# Patient Record
Sex: Female | Born: 1980 | Race: White | Hispanic: No | Smoking: Never smoker
Health system: Southern US, Community
[De-identification: ages and names within clinical notes are randomized; demographics above are authoritative.]

## PROBLEM LIST (undated history)

## (undated) DIAGNOSIS — F410 Panic disorder [episodic paroxysmal anxiety] without agoraphobia: Secondary | ICD-10-CM

## (undated) DIAGNOSIS — I10 Essential (primary) hypertension: Secondary | ICD-10-CM

## (undated) HISTORY — DX: Panic disorder (episodic paroxysmal anxiety): F41.0

---
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
## 2001-08-12 ENCOUNTER — Emergency Department (HOSPITAL_COMMUNITY): Admission: EM | Admit: 2001-08-12 | Discharge: 2001-08-12 | Payer: Self-pay | Admitting: Emergency Medicine

## 2002-03-13 ENCOUNTER — Inpatient Hospital Stay (HOSPITAL_COMMUNITY): Admission: EM | Admit: 2002-03-13 | Discharge: 2002-03-16 | Payer: Self-pay | Admitting: Emergency Medicine

## 2002-07-13 ENCOUNTER — Ambulatory Visit (HOSPITAL_COMMUNITY): Admission: AD | Admit: 2002-07-13 | Discharge: 2002-07-13 | Payer: Self-pay | Admitting: Internal Medicine

## 2002-07-13 ENCOUNTER — Encounter: Payer: Self-pay | Admitting: Obstetrics & Gynecology

## 2002-08-27 ENCOUNTER — Ambulatory Visit (HOSPITAL_COMMUNITY): Admission: AD | Admit: 2002-08-27 | Discharge: 2002-08-27 | Payer: Self-pay | Admitting: Obstetrics and Gynecology

## 2002-09-09 ENCOUNTER — Inpatient Hospital Stay (HOSPITAL_COMMUNITY): Admission: AD | Admit: 2002-09-09 | Discharge: 2002-09-12 | Payer: Self-pay | Admitting: Obstetrics and Gynecology

## 2008-01-03 ENCOUNTER — Other Ambulatory Visit: Admission: RE | Admit: 2008-01-03 | Discharge: 2008-01-03 | Payer: Self-pay | Admitting: Obstetrics and Gynecology

## 2008-07-09 ENCOUNTER — Inpatient Hospital Stay (HOSPITAL_COMMUNITY): Admission: AD | Admit: 2008-07-09 | Discharge: 2008-07-11 | Payer: Self-pay | Admitting: Obstetrics and Gynecology

## 2008-07-09 ENCOUNTER — Ambulatory Visit: Payer: Self-pay | Admitting: Family Medicine

## 2009-06-02 ENCOUNTER — Other Ambulatory Visit: Admission: RE | Admit: 2009-06-02 | Discharge: 2009-06-02 | Payer: Self-pay | Admitting: Obstetrics & Gynecology

## 2010-04-27 LAB — CBC
MCHC: 33.9 g/dL (ref 30.0–36.0)
MCV: 84.4 fL (ref 78.0–100.0)
Platelets: 149 10*3/uL — ABNORMAL LOW (ref 150–400)
RBC: 4.26 MIL/uL (ref 3.87–5.11)

## 2010-04-27 LAB — RPR: RPR Ser Ql: NONREACTIVE

## 2010-06-05 NOTE — Op Note (Signed)
   [name], Pace                        ACCOUNT NO.:  [id number]   MEDICAL RECORD NO.:  [id number]                   PATIENT TYPE:  INP   LOCATION:  A418                                 FACILITY:  [facility]   PHYSICIAN:  Lazaro Arms, M.D.                DATE OF BIRTH:  May 15, 1980   DATE OF PROCEDURE:  09/10/2002  DATE OF DISCHARGE:  09/12/2002                                 OPERATIVE REPORT   Michelle Pace is a 30 year old white female admitted in the active phase of labor.  She is 4 cm, requesting an epidural to be placed.  As a result, she is  placed in the sitting position, a fluid bolus is given.  The iliac crests  are identified, the L2-3 interspace identified and marked.  The area is  prepped and field-draped.  Lidocaine 1% is used as a local anesthetic.  A 17-  gauge Tuohy needle is used.  With one pass using a loss of resistance  technique, the epidural space is found without difficulty.  Bupivacaine  0.125% plain 10 mL was given as a test dose without ill effects.  An  additional 10 mL was given to dose up the epidural.  It was taped in place 5  cm from the epidural space and a continuous infusion of 0.125% bupivacaine  with 2 mcg/mL of fentanyl was begun at 12 mL/hr.  The patient tolerated the  procedure well.  She had no ill effects and was getting good relief after  approximately 15 minutes.                                               Lazaro Arms, M.D.    Loraine Maple  D:  10/02/2002  T:  10/02/2002  Job:  [id number]

## 2010-06-05 NOTE — Consult Note (Signed)
NAME:  Michelle Pace, BAUTCH                        ACCOUNT NO.:  [id number]   MEDICAL RECORD NO.:  [id number]                   PATIENT TYPE:  INP   LOCATION:  A420                                 FACILITY:  [facility]   PHYSICIAN:  Kofi A. Gerilyn Pilgrim, M.D.              DATE OF BIRTH:  03/17/1980   DATE OF CONSULTATION:  03/14/2002  DATE OF DISCHARGE:                                   CONSULTATION   REASON FOR CONSULTATION:  Fever and headache and neck soreness.   IMPRESSION:  Possible meningitis with fever of unknown origin.  Increased  headache and neck soreness.  Her fever could be coming from pharyngitis,  given the examination and history of sore throat.   RECOMMENDATIONS:  Proceed with a spinal tap to assess for the possibility of  meningitis.  However, this may be a viral process.  Once again this needs to  be ruled out.   HISTORY OF PRESENT ILLNESS:  The patient is a 30 year old Caucasian lady who  is [redacted] weeks pregnant.  She presents with a three to four day history of  fever of 101 to 102, headache, and neck soreness.  She also did complain of  having some soreness of her throat previously before coming to the hospital,  though this has improved.  Workup so far has been essentially unremarkable.  Her urinalysis did show a few white blood cells and bacteria, but there were  many epithelial cells indicating the most likely not a good specimen.  The  urine cultures 24 hours has been negative.  Blood cultures are negative.  She has continued to have fevers and elevated white blood cell count.  Additionally, headaches and neck soreness have persisted, although they have  improved with 36 hours of antibiotics.   PAST MEDICAL HISTORY:  Unremarkable.   MEDICATIONS:  1. Prenatal vitamins.  2. Ancef IV.  3. Tylenol with Phenergan p.r.n.   REVIEW OF SYMPTOMS:  As stated, but otherwise unremarkable.   PHYSICAL EXAMINATION:  VITAL SIGNS:  Temperature is 102.8, blood pressure is  120/59,  pulse 115, respiratory rate 20.  NECK:  Shows mild stiffness.  GENERAL:  She is awake and alert.  Speech is intact.  NEUROLOGIC:  Cranial nerves show pupils are 5 mm and briskly reactive.  Visual fields are intact.  Extraocular movements are full.  Face muscle  strength is normal.  Tongue and uvula are in the midline.  HEENT:  She has erythematous pharynx with +4 tonsillar hypertrophy.  Funduscopic examination showed that the disks are flat.  She has spontaneous  venous pulsation.  EXTREMITIES:  Muscle examination shows normal tone, bulk, and strength.  There is no pronator drift.  Coordination is intact.  Reflexes are +3  throughout, toes are downgoing.  She does not have a __________.  Sensory  examination is intact to light touch.  Kofi A. Gerilyn Pilgrim, M.D.    KAD/MEDQ  D:  03/14/2002  T:  03/14/2002  Job:  [id number]

## 2010-06-05 NOTE — Discharge Summary (Signed)
   NAME:  Michelle Pace, Michelle Pace                        ACCOUNT NO.:  [id number]   MEDICAL RECORD NO.:  [id number]                   PATIENT TYPE:  INP   LOCATION:  A420                                 FACILITY:  [facility]   PHYSICIAN:  Tilda Burrow, M.D.              DATE OF BIRTH:  1980/03/30   DATE OF ADMISSION:  03/12/2002  DATE OF DISCHARGE:  03/16/2002                                 DISCHARGE SUMMARY   ADMITTING DIAGNOSES:  1. Headache, viral syndrome versus meningitis.  2. Pregnancy, [redacted] weeks gestation.   DISCHARGE DIAGNOSES:  1. Headache, viral syndrome versus meningitis.  2. Pregnancy, [redacted] weeks gestation.   PROCEDURE:  Spinal tap by Dr. Beryle Beams   HISTORY OF PRESENT ILLNESS:  This 30 year old female, gravida 1, para 0 due  09/12/2002 by initial evaluation the blood type A positive and routine  uncomplicated pregnancy, to date, was admitted after presenting to the  emergency room complaining of severe headache, neck soreness, and fever.  The patient had temperature up to 101-102.  She complained of some soreness  of the throat before going to the hospital which had improved prior to  admission.  Evaluation was to rule out severe acute deterioration.   HOSPITAL COURSE:  The patient had observation for observation for 2 days  with initial headache and temperature to 100.5.  Abdomen soft with uncertain  source for the first 24 hours with the patient given Rocephin as suppressive  therapy.  The following day the patient was alert, oriented, afebrile,  tolerating some diet, though not particularly hungry with neck soreness to  anterior flexion causing the patient to cry out in discomfort.  There was  absence of any other findings with white count elevated to 24,900, still;  and she was considered a neurology for meningitis was necessary.   Consult with neurology was obtained, and due to the headache and  questionable history a lumbar puncture was performed which revealed  clear  fluid.  She was, therefore, felt stable for discharge the following day.  The differential diagnosis was pharyngitis versus febrile syndrome of  unknown etiology.  The patient was stable for discharge on 03/16/2002 after  48 hours of antibiotics with only slight improvement in laboratory  evaluation.   DISCHARGE INSTRUCTIONS:  She will be followed up in our office in several  days.                                               Tilda Burrow, M.D.    JVF/MEDQ  D:  04/03/2002  T:  04/04/2002  Job:  [id number]

## 2010-06-05 NOTE — Op Note (Signed)
   NAME:  Michelle Pace, Michelle Pace                        ACCOUNT NO.:  [id number]   MEDICAL RECORD NO.:  [id number]                   PATIENT TYPE:  INP   LOCATION:  A420                                 FACILITY:  [facility]   PHYSICIAN:  Kofi A. Gerilyn Pilgrim, M.D.              DATE OF BIRTH:  Jul 25, 1980   DATE OF PROCEDURE:  DATE OF DISCHARGE:                                  PROCEDURE NOTE   PROCEDURE:  Spinal tap.   INDICATIONS FOR PROCEDURE:  Headache, fever, increased WBC and neck  stiffness.   ANALYSIS:  The patient was informed of the procedure, the need for the  procedure. The risks and benefits of the procedure were explained and  percentages given in regards to post-tap headache __________  were also  explained to the patient. The patient was prepped and draped in the usual  sterile fashion in the lying down position. After a single attempt, she was  placed in the sitting position and the L4-5 space was accessed after a  single pass.  Fluid is clear. This was sent for routine labs and routine  studies. She tolerated the procedure well.                                               Kofi A. Gerilyn Pilgrim, M.D.    KAD/MEDQ  D:  03/14/2002  T:  03/14/2002  Job:  [id number]

## 2010-06-05 NOTE — Op Note (Signed)
   NAMESUSSIE, MINOR                          ACCOUNT NO.:  [id number]   MEDICAL RECORD NO.:  [id number]                   PATIENT TYPE:   LOCATION:                                       FACILITY:   PHYSICIAN:  Tilda Burrow, M.D.              DATE OF BIRTH:   DATE OF PROCEDURE:  09/10/2002  DATE OF DISCHARGE:                                 OPERATIVE REPORT   DELIVERY SUMMARY   DATE OF DELIVERY:  September 10, 2002   ONSET OF LABOR:  September 10, 2002 at 0230   LENGTH OF FIRST STAGE LABOR:  10 hours   LENGTH OF SECOND STAGE LABOR:  3 hours 27 minutes   LENGTH OF THIRD STAGE LABOR:  8 minutes   DELIVERY NOTE:  Due to ineffective maternal pushing, pushing was delayed  until the mother had urge to push after epidural anesthesia was turned off  for approximately 1-1/2 hours.  Mother was bol used for delivery 10 cc of  epidural medication that was premixed in the pump and Kiwi vacuum placed  with the fetal head at +2, +3 station due to maternal fatigue and mother  assisted x3 uterine contractions for delivery of head.  Upon delivery of  head a nuchal cord was noted which was loosened and shoulders easily slipped  through without any difficulty.  The infant was dried and suctioned; cord  clamped and cut; and placed on the mother's abdomen in good condition.  Apgars were 8 and 9.   Third stage of labor was actually managed with 20 units of Pitocin in 1000  cc of lactated Ringers at a rapid rate.  Placenta was delivered  spontaneously via Tomasa Blase mechanism.  Membranes were noted to be intact upon  inspection.  A 3-vessel cord was noted also upon inspection.  There were  first degree left and right labial lacerations which were not bleeding so no  stitches were placed.  The patient was instructed on perineum care.   NOTE:  Output of urine for the past 3 hours is approximately 150 cc.  Urine  is very dark and concentrated and plan bolus 1000 cc of fluid for hydration  and monitor  the patient's I&O closely with Foley in place.  Epidural  catheter was removed with blue tip intact.  Infant and mother stabilized in  good condition and transferred out to the postpartum unit.     Zerita Boers, Reita Cliche, M.D.   DL/MEDQ  D:  16/10/9602  T:  09/10/2002  Job:  [id number]   cc:   [facility]
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
## 2010-06-05 NOTE — H&P (Signed)
NAME:  Michelle Pace, Michelle Pace                        ACCOUNT NO.:  [id number]   MEDICAL RECORD NO.:  [id number]                   PATIENT TYPE:  INP   LOCATION:  A420                                 FACILITY:  [facility]   PHYSICIAN:  Tilda Burrow, M.D.              DATE OF BIRTH:  Apr 08, 1980   DATE OF ADMISSION:  03/12/2002  DATE OF DISCHARGE:                                HISTORY & PHYSICAL   ADMISSION DIAGNOSES:  1. Headache.  2. Fever.  3. Intrauterine pregnancy at 16 weeks' gestation.   HISTORY OF PRESENT ILLNESS:  This 30 year old female, G1, P0 with LMP  November 25, 2001, with [facility] September 12, 2002, with ultrasound estimated [facility]  corresponding almost exactly, who was admitted after her pregnancy, followed  through our office with two prenatal visits to date.  The patient presented  complaining of fever.  She was seen in the emergency room and referred to  our practice.  ER physician's evaluation included temperature to 101.9,  pulse 128, blood pressure 103/68 with 98% O2 saturations.  She was admitted  for evaluation of fever etiology.  Evaluation in the emergency room did not  identified the precise etiology of the problem, but did identify normal CBC  except for white count markedly elevated at 27,300 with other laboratory  data notable for potassium of 2.9 and urinalysis showing 11-20 wbc's, few  bacteria and many epithelial cells on a voided specimen.  The patient was  admitted for evaluation of fever of uncertain etiology.  She did have  soreness in her throat prior to coming to the hospital, although that had  improved.  Workup was unremarkable with the patient admitted for antibiotic  therapy.   PAST MEDICAL HISTORY:  Unremarkable.   PAST SURGICAL HISTORY:  Negative.   SOCIAL HISTORY:  She is single and lives with the baby's father.  Self-  described slow learner.   ALLERGIES:  No known drug allergies.   PHYSICAL EXAMINATION:  VITAL SIGNS:  Height 5 feet 6 inches,  weight 121.  Temperature 102.8, blood pressure 128/59, pulse 115, respirations 20.  GENERAL:  Moderately uncomfortable female, alert and oriented x3.  HEENT:  Pupils equal round and reactive.  NECK:  Slight soreness with forward manipulation.  CHEST:  Clear to auscultation.  BACK:  No CVA tenderness.  NEUROLOGIC:  Mental status shows the patient alert, cooperative and  appropriate.   IMPRESSION:  1. Fever of uncertain etiology.  2. Hypokalemia.  3.     Intrauterine pregnancy.  4. Marked leukocytosis.   PLAN:  IV antibiotic therapy.                                               Tilda Burrow, M.D.    JVF/MEDQ  D:  04/08/2002  T:  04/09/2002  Job:  [id number]

## 2011-11-19 HISTORY — PX: WISDOM TOOTH EXTRACTION: SHX21

## 2012-10-31 ENCOUNTER — Other Ambulatory Visit: Payer: Self-pay | Admitting: Women's Health

## 2012-11-14 ENCOUNTER — Other Ambulatory Visit (HOSPITAL_COMMUNITY)
Admission: RE | Admit: 2012-11-14 | Discharge: 2012-11-14 | Disposition: A | Discharge status: Home / Self Care | Payer: Medicaid Other | Source: Ambulatory Visit | Attending: Obstetrics & Gynecology | Admitting: Obstetrics & Gynecology

## 2012-11-14 ENCOUNTER — Encounter: Payer: Self-pay | Admitting: Women's Health

## 2012-11-14 ENCOUNTER — Ambulatory Visit (INDEPENDENT_AMBULATORY_CARE_PROVIDER_SITE_OTHER): Payer: Medicaid Other | Admitting: Women's Health

## 2012-11-14 VITALS — BP 120/80 | Ht 60.0 in | Wt 156.0 lb

## 2012-11-14 DIAGNOSIS — Z0000 Encounter for general adult medical examination without abnormal findings: Secondary | ICD-10-CM

## 2012-11-14 DIAGNOSIS — N390 Urinary tract infection, site not specified: Secondary | ICD-10-CM

## 2012-11-14 DIAGNOSIS — Z01419 Encounter for gynecological examination (general) (routine) without abnormal findings: Secondary | ICD-10-CM

## 2012-11-14 DIAGNOSIS — Z1151 Encounter for screening for human papillomavirus (HPV): Secondary | ICD-10-CM | POA: Insufficient documentation

## 2012-11-14 DIAGNOSIS — Z113 Encounter for screening for infections with a predominantly sexual mode of transmission: Secondary | ICD-10-CM | POA: Insufficient documentation

## 2012-11-14 LAB — POCT URINALYSIS DIPSTICK
Leukocytes, UA: NEGATIVE
Nitrite, UA: NEGATIVE
Protein, UA: NEGATIVE

## 2012-11-14 NOTE — Progress Notes (Signed)
Patient ID: Michelle Pace, female   DOB: 24-Aug-1980, 32 y.o.   MRN: [id number] Subjective:     Michelle Pace is a 32 y.o. Caucasian  female here for a routine well-woman exam.  Patient's last menstrual period was 11/08/2012. Regular periods.  Current complaints: Rt knee pain x 2 days, no recent trauma. Occ HAs.  Smoking Status: non-smoker Does desire labs, but not fasting today. Does not desire STI screening.   Personal health questionnaire reviewed: yes.   Gynecologic History Patient's last menstrual period was 11/08/2012. Contraception: OCP (estrogen/progesterone) Last Pap: 2013 at [place]. Results were: normal per pt Last mammogram: never. Results were: n/a  Obstetric History Reports having 2 sons, both SVD  The following portions of the patient's history were reviewed and updated as appropriate: allergies, current medications, past family history, past medical history, past social history, past surgical history and problem list.  Review of Systems  Review of Systems  Constitutional: Negative for fever, chills, weight loss, malaise/fatigue and diaphoresis.  HENT: Negative for hearing loss, ear pain, nosebleeds, congestion, sore throat, neck       pain, tinnitus and ear discharge.   Eyes: Negative for blurred vision, double vision, photophobia, pain, discharge and       redness.  Respiratory: Negative for cough, hemoptysis, sputum production, shortness of breath,       wheezing and stridor.   Cardiovascular: Negative for chest pain, palpitations, orthopnea, claudication, leg       swelling and PND.  Gastrointestinal: Negative for heartburn, nausea, vomiting, diarrhea, constipation, blood in stool and melena. Pos for     occ lower pelvic pain.  Genitourinary: Negative for dysuria, urgency, frequency, hematuria, flank pain,       incontinence, and dyspareunia.  Musculoskeletal: Negative for myalgias, back pain, and falls. Does have Rt knee pain Skin: Negative for itching  and rash.  Neurological: Negative for dizziness, tingling, tremors, sensory change, speech change,     focal weakness, seizures, loss of consciousness, weakness and headaches.  Endo/Heme/Allergies: Negative for environmental allergies and polydipsia. Does not       bruise/bleed easily.  Psychiatric/Behavioral: Negative for depression, suicidal ideas, hallucinations, memory       loss and substance abuse. The patient is not nervous/anxious and does not have       insomnia.        Objective:     Physical Exam  BP 120/80  Ht 5' (1.524 m)  Wt 156 lb (70.761 kg)  BMI 30.47 kg/m2  LMP 11/08/2012 Constitutional: She is oriented to person, place, and time. She appears well-developed    and well-nourished.  HEENT:     Head: Normocephalic and atraumatic.           Right Ear: External ear normal.     Left Ear: External ear normal.     Nose: Nose normal.     Mouth/Throat: Oropharynx is clear and moist.     Eyes: Conjunctivae and EOM are normal. Pupils are equal, round, and reactive to light.    Right eye exhibits no discharge. Left eye exhibits no discharge. No scleral icterus.  Neck: Normal range of motion. Neck supple. No tracheal deviation present. No          thyromegaly present.  Cardiovascular: Normal rate, regular rhythm, normal heart sounds and intact distal          pulses.  Exam reveals no gallop and no friction rub.  No murmur heard. Respiratory: Effort normal and breath sounds normal.  No respiratory distress. She has       no wheezes. She has no rales. She exhibits no tenderness.  Breasts: no dominate palpable mass, retraction or nipple discharge  GI: Soft. Bowel sounds are normal. She exhibits no distension and no mass. There is no    tenderness. There is no rebound and no guarding.     Hemoccult: n/a Genitourinary:     Vulva is normal without lesions    Vagina is pink moist without discharge    Cervix normal in appearance and thin prep pap is done w/ HR HPV cotesting     Uterus is normal size shape and contour    Adnexa is negative with normal sized ovaries   Musculoskeletal: Normal range of motion. She exhibits no edema and no tenderness. No obvious edema, erythema,       tenderness, bruising or abnormality to Rt knee.  Neurological: She is alert and oriented to person, place, and time. She has normal    reflexes. She displays normal reflexes. No cranial nerve deficit. She exhibits normal    muscle tone. Coordination normal.  Skin: Skin is warm and dry. No rash noted. No erythema. No pallor.  Psychiatric: She has a normal mood and affect. Her behavior is normal. Judgment and    thought content normal.       Assessment:     Healthy well-woman exam Rt knee pain Occ HAs     Plan:   F/U 1 year for physical F/U w/ PCP if knee pain continues Continue COCs To return this week for fasting labs: CBC, CMP, TSH, Lipid panel Mammogram @ 31yo or sooner if problems Colonoscopy @ 31yo or sooner if problems   Marge Duncans 11/14/2012 1:06 PM

## 2012-11-14 NOTE — Patient Instructions (Addendum)
*  Nothing to eat or drink after midnight the night before you come for your labs*  Migraine Headache A migraine headache is an intense, throbbing pain on one or both sides of your head. A migraine can last for 30 minutes to several hours. CAUSES  The exact cause of a migraine headache is not always known. However, a migraine may be caused when nerves in the brain become irritated and release chemicals that cause inflammation. This causes pain. SYMPTOMS  Pain on one or both sides of your head.  Pulsating or throbbing pain.  Severe pain that prevents daily activities.  Pain that is aggravated by any physical activity.  Nausea, vomiting, or both.  Dizziness.  Pain with exposure to bright lights, loud noises, or activity.  General sensitivity to bright lights, loud noises, or smells. Before you get a migraine, you may get warning signs that a migraine is coming (aura). An aura may include:  Seeing flashing lights.  Seeing bright spots, halos, or zig-zag lines.  Having tunnel vision or blurred vision.  Having feelings of numbness or tingling.  Having trouble talking.  Having muscle weakness. MIGRAINE TRIGGERS  Alcohol.  Smoking.  Stress.  Menstruation.  Aged cheeses.  Foods or drinks that contain nitrates, glutamate, aspartame, or tyramine.  Lack of sleep.  Chocolate.  Caffeine.  Hunger.  Physical exertion.  Fatigue.  Medicines used to treat chest pain (nitroglycerine), birth control pills, estrogen, and some blood pressure medicines. DIAGNOSIS  A migraine headache is often diagnosed based on:  Symptoms.  Physical examination.  A CT scan or MRI of your head. TREATMENT Medicines may be given for pain and nausea. Medicines can also be given to help prevent recurrent migraines.  HOME CARE INSTRUCTIONS  Only take over-the-counter or prescription medicines for pain or discomfort as directed by your caregiver. The use of long-term narcotics is not  recommended.  Lie down in a dark, quiet room when you have a migraine.  Keep a journal to find out what may trigger your migraine headaches. For example, write down:  What you eat and drink.  How much sleep you get.  Any change to your diet or medicines.  Limit alcohol consumption.  Quit smoking if you smoke.  Get 7 to 9 hours of sleep, or as recommended by your caregiver.  Limit stress.  Keep lights dim if bright lights bother you and make your migraines worse. SEEK IMMEDIATE MEDICAL CARE IF:   Your migraine becomes severe.  You have a fever.  You have a stiff neck.  You have vision loss.  You have muscular weakness or loss of muscle control.  You start losing your balance or have trouble walking.  You feel faint or pass out.  You have severe symptoms that are different from your first symptoms. MAKE SURE YOU:   Understand these instructions.  Will watch your condition.  Will get help right away if you are not doing well or get worse. Document Released: 01/04/2005 Document Revised: 03/29/2011 Document Reviewed: 12/25/2010 [facility] Patient Information 2014 [place], [place].

## 2012-11-21 ENCOUNTER — Other Ambulatory Visit: Payer: Medicaid Other

## 2012-11-21 DIAGNOSIS — Z0000 Encounter for general adult medical examination without abnormal findings: Secondary | ICD-10-CM

## 2012-11-21 DIAGNOSIS — Z1329 Encounter for screening for other suspected endocrine disorder: Secondary | ICD-10-CM

## 2012-11-21 DIAGNOSIS — Z13220 Encounter for screening for lipoid disorders: Secondary | ICD-10-CM

## 2012-11-21 LAB — COMPREHENSIVE METABOLIC PANEL
AST: 9 U/L (ref 0–37)
Alkaline Phosphatase: 77 U/L (ref 39–117)
BUN: 9 mg/dL (ref 6–23)
CO2: 25 mEq/L (ref 19–32)
Calcium: 9 mg/dL (ref 8.4–10.5)
Creat: 0.73 mg/dL (ref 0.50–1.10)
Potassium: 3.9 mEq/L (ref 3.5–5.3)
Sodium: 138 mEq/L (ref 135–145)

## 2012-11-21 LAB — CBC
MCH: 28.5 pg (ref 26.0–34.0)
MCHC: 33.6 g/dL (ref 30.0–36.0)
MCV: 84.9 fL (ref 78.0–100.0)
Platelets: 284 10*3/uL (ref 150–400)
RBC: 4.49 MIL/uL (ref 3.87–5.11)
RDW: 13.8 % (ref 11.5–15.5)
WBC: 9.9 10*3/uL (ref 4.0–10.5)

## 2012-11-21 LAB — LIPID PANEL
HDL: 52 mg/dL (ref >39)
LDL Cholesterol: 79 mg/dL (ref 0–99)
Total CHOL/HDL Ratio: 3 Ratio

## 2012-11-21 LAB — TSH: TSH: 0.993 u[IU]/mL (ref 0.350–4.500)

## 2012-11-23 ENCOUNTER — Telehealth: Payer: Self-pay | Admitting: *Deleted

## 2012-11-23 NOTE — Telephone Encounter (Signed)
Pt informed results normal from 11/21/2012.

## 2013-01-05 ENCOUNTER — Other Ambulatory Visit: Payer: Self-pay | Admitting: Adult Health

## 2013-01-09 ENCOUNTER — Other Ambulatory Visit: Payer: Self-pay | Admitting: Obstetrics & Gynecology

## 2013-08-20 ENCOUNTER — Telehealth: Payer: Self-pay | Admitting: Adult Health

## 2013-08-20 NOTE — Telephone Encounter (Signed)
Pt states she gets short of breath and wheezes every once in a while and wants to know if it could be due to her BCP, she states she was on the injection and then switched to an oral BC and doesn't remember ever having this occur while on the shot, she cant really say when it started and it is only every now and then that she gets it.  She states she has an appointment with her PCP this week, I advised her to keep her appt with him and discuss her symptoms with him and if she still thinks it could be due to the BCP call us back.  Pt verbalized understanding.

## 2013-08-23 ENCOUNTER — Other Ambulatory Visit (HOSPITAL_COMMUNITY): Payer: Self-pay | Admitting: Physician Assistant

## 2013-08-23 ENCOUNTER — Ambulatory Visit (HOSPITAL_COMMUNITY)
Admission: RE | Admit: 2013-08-23 | Discharge: 2013-08-23 | Disposition: A | Discharge status: Home / Self Care | Payer: Medicaid Other | Source: Ambulatory Visit | Attending: Physician Assistant | Admitting: Physician Assistant

## 2013-08-23 DIAGNOSIS — R062 Wheezing: Secondary | ICD-10-CM | POA: Diagnosis not present

## 2013-08-23 DIAGNOSIS — R5381 Other malaise: Secondary | ICD-10-CM | POA: Diagnosis not present

## 2013-08-23 DIAGNOSIS — R05 Cough: Secondary | ICD-10-CM | POA: Insufficient documentation

## 2013-08-23 DIAGNOSIS — R5383 Other fatigue: Secondary | ICD-10-CM | POA: Diagnosis not present

## 2013-08-23 DIAGNOSIS — R0602 Shortness of breath: Secondary | ICD-10-CM | POA: Insufficient documentation

## 2013-08-23 DIAGNOSIS — R059 Cough, unspecified: Secondary | ICD-10-CM | POA: Diagnosis not present

## 2013-09-11 ENCOUNTER — Other Ambulatory Visit: Payer: Self-pay | Admitting: Obstetrics & Gynecology

## 2013-12-25 ENCOUNTER — Encounter (HOSPITAL_COMMUNITY): Payer: Self-pay | Admitting: *Deleted

## 2013-12-25 ENCOUNTER — Emergency Department (HOSPITAL_COMMUNITY)
Admission: EM | Admit: 2013-12-25 | Discharge: 2013-12-26 | Disposition: A | Discharge status: Home / Self Care | Payer: Medicaid Other | Attending: Emergency Medicine | Admitting: Emergency Medicine

## 2013-12-25 DIAGNOSIS — R112 Nausea with vomiting, unspecified: Secondary | ICD-10-CM | POA: Diagnosis present

## 2013-12-25 DIAGNOSIS — R197 Diarrhea, unspecified: Secondary | ICD-10-CM | POA: Diagnosis not present

## 2013-12-25 DIAGNOSIS — Z3202 Encounter for pregnancy test, result negative: Secondary | ICD-10-CM | POA: Diagnosis not present

## 2013-12-25 DIAGNOSIS — Z8659 Personal history of other mental and behavioral disorders: Secondary | ICD-10-CM | POA: Insufficient documentation

## 2013-12-25 DIAGNOSIS — Z79899 Other long term (current) drug therapy: Secondary | ICD-10-CM | POA: Diagnosis not present

## 2013-12-25 DIAGNOSIS — R109 Unspecified abdominal pain: Secondary | ICD-10-CM | POA: Diagnosis not present

## 2013-12-25 LAB — URINE MICROSCOPIC-ADD ON

## 2013-12-25 LAB — URINALYSIS, ROUTINE W REFLEX MICROSCOPIC
GLUCOSE, UA: NEGATIVE mg/dL
Hgb urine dipstick: NEGATIVE
KETONES UR: 40 mg/dL — AB
Leukocytes, UA: NEGATIVE
Nitrite: NEGATIVE
PH: 5.5 (ref 5.0–8.0)
Specific Gravity, Urine: >1.03 — ABNORMAL HIGH (ref 1.005–1.030)
Urobilinogen, UA: 0.2 mg/dL (ref 0.0–1.0)

## 2013-12-25 LAB — PREGNANCY, URINE: Preg Test, Ur: NEGATIVE

## 2013-12-25 MED ORDER — ONDANSETRON 4 MG PO TBDP: 4.0000 mg | ORAL_TABLET | Freq: Three times a day (TID) | ORAL | Status: DC | PRN

## 2013-12-25 MED ORDER — SODIUM CHLORIDE 0.9 % IV SOLN
1000.0000 mL | Freq: Once | INTRAVENOUS | Status: AC
  Administered 2013-12-25: 1000 mL via INTRAVENOUS

## 2013-12-25 MED ORDER — ONDANSETRON 4 MG PO TBDP
ORAL_TABLET | ORAL | Status: AC
  Administered 2013-12-25: 4 mg
  Filled 2013-12-25: qty 1

## 2013-12-25 MED ORDER — ONDANSETRON HCL 4 MG/2ML IJ SOLN
4.0000 mg | Freq: Once | INTRAMUSCULAR | Status: DC
  Filled 2013-12-25: qty 2

## 2013-12-25 MED ORDER — SODIUM CHLORIDE 0.9 % IV BOLUS (SEPSIS): 1000.0000 mL | Freq: Once | INTRAVENOUS | Status: DC

## 2013-12-25 MED ORDER — ONDANSETRON 4 MG PO TBDP: 4.0000 mg | ORAL_TABLET | Freq: Once | ORAL | Status: AC

## 2013-12-25 NOTE — ED Provider Notes (Addendum)
12:00 AM  Assumed care from Dr. Hyacinth MeekerMiller.  Pt is a 33 y.o. F who presents to the emergency department with vomiting and diarrhea. Abdominal exam benign. Plan was to follow up on urinalysis, urine pregnancy test. Reassess symptoms. Patient reports feeling much better after IV medications.  Urine shows many bacteria but also many squamous cells and no other sign of infection. She does have elevated specific gravity and ketones in her urine but is now tolerating by mouth without vomiting or diarrhea. Repeat abdominal exam benign. Urine pregnancy negative. She states her son has had similar symptoms. Suspect viral illness. Will discharge home with prescription for Zofran. Have advised her to continue drinking clear fluids. Discussed return precautions. She verbalized understanding and is comfortable with plan.  Layla MawKristen N Ward, DO 12/25/13 2350  Layla MawKristen N Ward, DO 12/25/13 2351

## 2013-12-25 NOTE — Discharge Instructions (Signed)

## 2013-12-25 NOTE — ED Notes (Signed)
Pt c/o vomiting on and off all day.

## 2013-12-25 NOTE — ED Provider Notes (Signed)
CSN: [id number]     Arrival date & time 12/25/13  2108 History  This chart was scribed for Michelle RollerBrian D Mychal Decarlo, MD by Gwenyth Oberatherine Macek, ED Scribe. This patient was seen in room APA05/APA05 and the patient's care was started at 9:44 PM.    No chief complaint on file.   The history is provided by the patient. No language interpreter was used.    HPI Comments: Baxter HireJessica L Pace is a 33 y.o. female who presents to the Emergency Department complaining of intermittent watery diarrhea and vomiting that started 1 day ago. She states lower abdominal pain as an associated symptom. Pt notes positive sick contact at home and that one of her children is vomiting after contracting a virus. She has tried Advil and Weyerhaeuser CompanyPepto Bismol with no relief. Pt denies blood in her stool, headache, sore throat and dysuria as associated symptoms.   Past Medical History  Diagnosis Date  . Panic attacks    Past Surgical History  Procedure Laterality Date  . Wisdom tooth extraction  11/2011   Family History  Problem Relation Age of Onset  . Other Mother     brain tumor  . Cancer Father    History  Substance Use Topics  . Smoking status: Never Smoker   . Smokeless tobacco: Never Used  . Alcohol Use: No   OB History    No data available     Review of Systems  Gastrointestinal: Positive for nausea, vomiting and diarrhea.  All other systems reviewed and are negative.   Allergies  Review of patient's allergies indicates no known allergies.  Home Medications   Prior to Admission medications   Medication Sig Start Date End Date Taking? Authorizing Provider  escitalopram (LEXAPRO) 20 MG tablet take 1 tablet by mouth once daily 09/11/13   Lazaro ArmsLuther H Eure, MD  ibuprofen (ADVIL,MOTRIN) 200 MG tablet Take 200 mg by mouth as needed for pain.    Historical Provider, MD  TRI-LINYAH 0.18/0.215/0.25 MG-35 MCG tablet take 1 tablet by mouth once daily 01/05/13   Adline PotterJennifer A Griffin, NP   BP 107/74 mmHg  Pulse 123  Temp(Src) 98.1  F (36.7 C) (Oral)  Resp 20  Ht 5\' 1"  (1.549 m)  Wt 150 lb (68.04 kg)  BMI 28.36 kg/m2  SpO2 100%  LMP 12/16/2013 Physical Exam  Constitutional: She appears well-developed and well-nourished. No distress.  HENT:  Head: Normocephalic and atraumatic.  Mouth/Throat: Oropharynx is clear and moist. No oropharyngeal exudate.  Eyes: Conjunctivae and EOM are normal. Pupils are equal, round, and reactive to light. Right eye exhibits no discharge. Left eye exhibits no discharge. No scleral icterus.  Neck: Normal range of motion. Neck supple. No JVD present. No thyromegaly present.  Cardiovascular: Normal rate, regular rhythm, normal heart sounds and intact distal pulses.  Exam reveals no gallop and no friction rub.   No murmur heard. Pulmonary/Chest: Effort normal and breath sounds normal. No respiratory distress. She has no wheezes. She has no rales.  Abdominal: Soft. Bowel sounds are normal. She exhibits no distension and no mass. There is no tenderness. There is no guarding.  Lower abdominal tenderness  Musculoskeletal: Normal range of motion. She exhibits no edema or tenderness.  Lymphadenopathy:    She has no cervical adenopathy.  Neurological: She is alert. Coordination normal.  Skin: Skin is warm and dry. No rash noted. No erythema.  Psychiatric: She has a normal mood and affect. Her behavior is normal.  Nursing note and vitals reviewed.   ED  Course  Procedures (including critical care time) DIAGNOSTIC STUDIES: Oxygen Saturation is 100% on RA, normal by my interpretation.    COORDINATION OF CARE: 9:46 PM Discussed treatment plan with pt which includes UA and lab work. Pt agreed to plan.  Labs Review Labs Reviewed  URINALYSIS, ROUTINE W REFLEX MICROSCOPIC  PREGNANCY, URINE  CBC WITH DIFFERENTIAL  COMPREHENSIVE METABOLIC PANEL  LIPASE, BLOOD    Imaging Review No results found.    MDM   Final diagnoses:  None    Pt with high SG, fluids, zofran given, pt agreeable to  f/u - well appaering otherwise, tachycardia improved  Meds given in ED:  Medications  ondansetron (ZOFRAN-ODT) 4 MG disintegrating tablet (4 mg  Given 12/25/13 2205)  ondansetron (ZOFRAN-ODT) disintegrating tablet 4 mg (0 mg Oral Duplicate 12/25/13 2206)  0.9 %  sodium chloride infusion (0 mLs Intravenous Stopped 12/25/13 2351)    Discharge Medication List as of 12/25/2013 11:51 PM    START taking these medications   Details  ondansetron (ZOFRAN ODT) 4 MG disintegrating tablet Take 1 tablet (4 mg total) by mouth every 8 (eight) hours as needed for nausea or vomiting., Starting 12/25/2013, Until Discontinued, Print          I personally performed the services described in this documentation, which was scribed in my presence. The recorded information has been reviewed and is accurate.       Michelle RollerBrian D Waverley Krempasky, MD 01/01/14 [phone]

## 2014-02-02 ENCOUNTER — Other Ambulatory Visit: Payer: Self-pay | Admitting: Adult Health

## 2014-02-07 ENCOUNTER — Encounter: Payer: Self-pay | Admitting: Obstetrics and Gynecology

## 2014-02-07 ENCOUNTER — Other Ambulatory Visit: Payer: Medicaid Other | Admitting: Obstetrics and Gynecology

## 2014-02-15 ENCOUNTER — Ambulatory Visit (INDEPENDENT_AMBULATORY_CARE_PROVIDER_SITE_OTHER): Payer: Medicaid Other | Admitting: Obstetrics & Gynecology

## 2014-02-15 ENCOUNTER — Other Ambulatory Visit (HOSPITAL_COMMUNITY)
Admission: RE | Admit: 2014-02-15 | Discharge: 2014-02-15 | Disposition: A | Discharge status: Home / Self Care | Payer: Medicaid Other | Source: Ambulatory Visit | Attending: Obstetrics and Gynecology | Admitting: Obstetrics and Gynecology

## 2014-02-15 ENCOUNTER — Encounter: Payer: Self-pay | Admitting: Obstetrics & Gynecology

## 2014-02-15 VITALS — BP 130/90 | Ht 59.0 in | Wt 166.0 lb

## 2014-02-15 DIAGNOSIS — Z01419 Encounter for gynecological examination (general) (routine) without abnormal findings: Secondary | ICD-10-CM | POA: Diagnosis not present

## 2014-02-15 DIAGNOSIS — Z0000 Encounter for general adult medical examination without abnormal findings: Secondary | ICD-10-CM

## 2014-02-15 DIAGNOSIS — Z3202 Encounter for pregnancy test, result negative: Secondary | ICD-10-CM

## 2014-02-15 LAB — POCT URINE PREGNANCY: Preg Test, Ur: NEGATIVE

## 2014-02-15 MED ORDER — NORGESTIM-ETH ESTRAD TRIPHASIC 0.18/0.215/0.25 MG-35 MCG PO TABS: 1.0000 | ORAL_TABLET | Freq: Every day | ORAL | Status: DC

## 2014-02-15 NOTE — Addendum Note (Signed)
Addended by: Criss AlvinePULLIAM, Julianah Marciel G on: 02/15/2014 10:59 AM   Modules accepted: Orders

## 2014-02-15 NOTE — Progress Notes (Signed)
Patient ID: Michelle Pace, female   DOB: 07/26/1980, 34 y.o.   MRN: [id number] Subjective:     Michelle Pace is a 34 y.o. female here for a routine exam.  Patient's last menstrual period was 02/12/2014. No obstetric history on file. Birth Control Method:  OCP Menstrual Calendar(currently): regular  Current complaints: none.   Current acute medical issues:  none   Recent Gynecologic History Patient's last menstrual period was 02/12/2014. Last Pap: 2014,  normal Last mammogram: na,    Past Medical History  Diagnosis Date  . Panic attacks     Past Surgical History  Procedure Laterality Date  . Wisdom tooth extraction  11/2011    OB History    No data available      History   Social History  . Marital Status: Single    Spouse Name: N/A    Number of Children: N/A  . Years of Education: N/A   Social History Main Topics  . Smoking status: Never Smoker   . Smokeless tobacco: Never Used  . Alcohol Use: No  . Drug Use: No  . Sexual Activity: Yes    Birth Control/ Protection: Pill   Other Topics Concern  . None   Social History Narrative    Family History  Problem Relation Age of Onset  . Other Mother     brain tumor  . Cancer Father      Current outpatient prescriptions:  .  escitalopram (LEXAPRO) 20 MG tablet, take 1 tablet by mouth once daily, Disp: 30 tablet, Rfl: 11 .  ibuprofen (ADVIL,MOTRIN) 200 MG tablet, Take 200 mg by mouth as needed for pain., Disp: , Rfl:  .  ondansetron (ZOFRAN ODT) 4 MG disintegrating tablet, Take 1 tablet (4 mg total) by mouth every 8 (eight) hours as needed for nausea or vomiting. (Patient not taking: Reported on 02/15/2014), Disp: 20 tablet, Rfl: 0 .  TRI-SPRINTEC 0.18/0.215/0.25 MG-35 MCG tablet, take 1 tablet by mouth once daily (Patient not taking: Reported on 02/15/2014), Disp: 28 tablet, Rfl: PRN  Review of Systems  Review of Systems  Constitutional: Negative for fever, chills, weight loss, malaise/fatigue and  diaphoresis.  HENT: Negative for hearing loss, ear pain, nosebleeds, congestion, sore throat, neck pain, tinnitus and ear discharge.   Eyes: Negative for blurred vision, double vision, photophobia, pain, discharge and redness.  Respiratory: Negative for cough, hemoptysis, sputum production, shortness of breath, wheezing and stridor.   Cardiovascular: Negative for chest pain, palpitations, orthopnea, claudication, leg swelling and PND.  Gastrointestinal: negative for abdominal pain. Negative for heartburn, nausea, vomiting, diarrhea, constipation, blood in stool and melena.  Genitourinary: Negative for dysuria, urgency, frequency, hematuria and flank pain.  Musculoskeletal: Negative for myalgias, back pain, joint pain and falls.  Skin: Negative for itching and rash.  Neurological: Negative for dizziness, tingling, tremors, sensory change, speech change, focal weakness, seizures, loss of consciousness, weakness and headaches.  Endo/Heme/Allergies: Negative for environmental allergies and polydipsia. Does not bruise/bleed easily.  Psychiatric/Behavioral: Negative for depression, suicidal ideas, hallucinations, memory loss and substance abuse. The patient is not nervous/anxious and does not have insomnia.        Objective:  Blood pressure 130/90, height 4\' 11"  (1.499 m), weight 166 lb (75.297 kg), last menstrual period 02/12/2014.   Physical Exam  Vitals reviewed. Constitutional: She is oriented to person, place, and time. She appears well-developed and well-nourished.  HENT:  Head: Normocephalic and atraumatic.        Right Ear: External ear normal.  Left Ear: External ear normal.  Nose: Nose normal.  Mouth/Throat: Oropharynx is clear and moist.  Eyes: Conjunctivae and EOM are normal. Pupils are equal, round, and reactive to light. Right eye exhibits no discharge. Left eye exhibits no discharge. No scleral icterus.  Neck: Normal range of motion. Neck supple. No tracheal deviation present. No  thyromegaly present.  Cardiovascular: Normal rate, regular rhythm, normal heart sounds and intact distal pulses.  Exam reveals no gallop and no friction rub.   No murmur heard. Respiratory: Effort normal and breath sounds normal. No respiratory distress. She has no wheezes. She has no rales. She exhibits no tenderness.  GI: Soft. Bowel sounds are normal. She exhibits no distension and no mass. There is no tenderness. There is no rebound and no guarding.  Genitourinary:  Breasts no masses skin changes or nipple changes bilaterally      Vulva is normal without lesions Vagina is pink moist without discharge Cervix normal in appearance and pap is done Uterus is normal size shape and contour Adnexa is negative with normal sized ovaries   Musculoskeletal: Normal range of motion. She exhibits no edema and no tenderness.  Neurological: She is alert and oriented to person, place, and time. She has normal reflexes. She displays normal reflexes. No cranial nerve deficit. She exhibits normal muscle tone. Coordination normal.  Skin: Skin is warm and dry. No rash noted. No erythema. No pallor.  Psychiatric: She has a normal mood and affect. Her behavior is normal. Judgment and thought content normal.       Assessment:    Healthy female exam.    Plan:    Contraception: OCP (estrogen/progesterone).    Follow up prn or 1 year

## 2014-02-18 LAB — CYTOLOGY - PAP

## 2014-09-12 ENCOUNTER — Other Ambulatory Visit: Payer: Self-pay | Admitting: Obstetrics & Gynecology

## 2014-09-13 ENCOUNTER — Other Ambulatory Visit: Payer: Self-pay | Admitting: Obstetrics & Gynecology

## 2015-02-15 ENCOUNTER — Other Ambulatory Visit: Payer: Self-pay | Admitting: Adult Health

## 2015-02-20 ENCOUNTER — Other Ambulatory Visit: Payer: Medicaid Other | Admitting: Obstetrics & Gynecology

## 2015-02-28 ENCOUNTER — Other Ambulatory Visit: Payer: Medicaid Other | Admitting: Obstetrics & Gynecology

## 2015-02-28 ENCOUNTER — Encounter: Payer: Self-pay | Admitting: Obstetrics & Gynecology

## 2015-04-19 ENCOUNTER — Encounter (HOSPITAL_COMMUNITY): Payer: Self-pay | Admitting: *Deleted

## 2015-04-19 ENCOUNTER — Emergency Department (HOSPITAL_COMMUNITY)
Admission: EM | Admit: 2015-04-19 | Discharge: 2015-04-19 | Disposition: A | Discharge status: Home / Self Care | Payer: Medicaid Other | Attending: Emergency Medicine | Admitting: Emergency Medicine

## 2015-04-19 ENCOUNTER — Emergency Department (HOSPITAL_COMMUNITY): Payer: Medicaid Other

## 2015-04-19 DIAGNOSIS — F419 Anxiety disorder, unspecified: Secondary | ICD-10-CM | POA: Diagnosis present

## 2015-04-19 DIAGNOSIS — Z79899 Other long term (current) drug therapy: Secondary | ICD-10-CM | POA: Diagnosis not present

## 2015-04-19 DIAGNOSIS — E860 Dehydration: Secondary | ICD-10-CM | POA: Insufficient documentation

## 2015-04-19 DIAGNOSIS — J020 Streptococcal pharyngitis: Secondary | ICD-10-CM | POA: Insufficient documentation

## 2015-04-19 LAB — URINE MICROSCOPIC-ADD ON: RBC / HPF: NONE SEEN RBC/hpf (ref 0–5)

## 2015-04-19 LAB — CBC
HEMATOCRIT: 38.5 % (ref 36.0–46.0)
Hemoglobin: 13.2 g/dL (ref 12.0–15.0)
MCH: 29.5 pg (ref 26.0–34.0)
MCHC: 34.3 g/dL (ref 30.0–36.0)
MCV: 85.9 fL (ref 78.0–100.0)
PLATELETS: 254 10*3/uL (ref 150–400)
RBC: 4.48 MIL/uL (ref 3.87–5.11)
RDW: 13.5 % (ref 11.5–15.5)
WBC: 26 10*3/uL — ABNORMAL HIGH (ref 4.0–10.5)

## 2015-04-19 LAB — URINALYSIS, ROUTINE W REFLEX MICROSCOPIC
GLUCOSE, UA: NEGATIVE mg/dL
Hgb urine dipstick: NEGATIVE
Nitrite: NEGATIVE
PH: 6 (ref 5.0–8.0)
Protein, ur: NEGATIVE mg/dL
Specific Gravity, Urine: 1.02 (ref 1.005–1.030)

## 2015-04-19 LAB — RAPID STREP SCREEN (MED CTR MEBANE ONLY): Streptococcus, Group A Screen (Direct): POSITIVE — AB

## 2015-04-19 LAB — COMPREHENSIVE METABOLIC PANEL
ALT: 12 U/L — AB (ref 14–54)
AST: 21 U/L (ref 15–41)
Albumin: 3.9 g/dL (ref 3.5–5.0)
Alkaline Phosphatase: 77 U/L (ref 38–126)
Anion gap: 12 (ref 5–15)
BILIRUBIN TOTAL: 0.9 mg/dL (ref 0.3–1.2)
BUN: 10 mg/dL (ref 6–20)
CHLORIDE: 107 mmol/L (ref 101–111)
CO2: 18 mmol/L — ABNORMAL LOW (ref 22–32)
Calcium: 8.7 mg/dL — ABNORMAL LOW (ref 8.9–10.3)
Creatinine, Ser: 0.87 mg/dL (ref 0.44–1.00)
GFR calc Af Amer: >60 mL/min (ref >60)
GFR calc non Af Amer: >60 mL/min (ref >60)
GLUCOSE: 137 mg/dL — AB (ref 65–99)
Potassium: 2.9 mmol/L — ABNORMAL LOW (ref 3.5–5.1)
Sodium: 137 mmol/L (ref 135–145)
TOTAL PROTEIN: 7.1 g/dL (ref 6.5–8.1)

## 2015-04-19 LAB — I-STAT CG4 LACTIC ACID, ED
LACTIC ACID, VENOUS: 2.36 mmol/L — AB (ref 0.5–2.0)
Lactic Acid, Venous: 3.64 mmol/L (ref 0.5–2.0)

## 2015-04-19 LAB — PREGNANCY, URINE: Preg Test, Ur: NEGATIVE

## 2015-04-19 LAB — D-DIMER, QUANTITATIVE: D-Dimer, Quant: 0.32 ug/mL-FEU (ref 0.00–0.50)

## 2015-04-19 MED ORDER — SODIUM CHLORIDE 0.9 % IV BOLUS (SEPSIS)
1000.0000 mL | Freq: Once | INTRAVENOUS | Status: AC
  Administered 2015-04-19: 1000 mL via INTRAVENOUS

## 2015-04-19 MED ORDER — POTASSIUM CHLORIDE CRYS ER 20 MEQ PO TBCR
40.0000 meq | EXTENDED_RELEASE_TABLET | Freq: Once | ORAL | Status: AC
  Administered 2015-04-19: 40 meq via ORAL
  Filled 2015-04-19: qty 2

## 2015-04-19 MED ORDER — ACETAMINOPHEN 325 MG PO TABS
650.0000 mg | ORAL_TABLET | Freq: Once | ORAL | Status: AC
  Administered 2015-04-19: 650 mg via ORAL
  Filled 2015-04-19: qty 2

## 2015-04-19 MED ORDER — ONDANSETRON HCL 4 MG/2ML IJ SOLN
4.0000 mg | Freq: Once | INTRAMUSCULAR | Status: AC
  Administered 2015-04-19: 4 mg via INTRAVENOUS
  Filled 2015-04-19: qty 2

## 2015-04-19 MED ORDER — PENICILLIN G BENZATHINE 1200000 UNIT/2ML IM SUSP
1.2000 10*6.[IU] | Freq: Once | INTRAMUSCULAR | Status: AC
  Administered 2015-04-19: 1.2 10*6.[IU] via INTRAMUSCULAR
  Filled 2015-04-19: qty 2

## 2015-04-19 NOTE — ED Provider Notes (Signed)
CSN: [id number]     Arrival date & time 04/19/15  1147 History   First MD Initiated Contact with Patient 04/19/15 1301     Chief Complaint  Patient presents with  . Anxiety     (Consider location/radiation/quality/duration/timing/severity/associated sxs/prior Treatment) HPI Comments: Patient states she woke up this morning feeling unwell and had a "panic attack". This is associated with palpitations, cramping in her hands and arms, shortness of breath and generalized weakness and fatigue. She also had 2 episodes of nausea and vomiting. Denies any chest pain. Her shortness of breath is improved. She reports history of panic attacks and takes Lexapro but did not have her dose today. She also complains of a sore throat, runny nose and cough over the past day. No diarrhea. She did receive a flu shot. She is on birth control pill. On arrival she is found to be tachycardic and febrile to 102.5 rectally.  Patient is a 35 y.o. female presenting with anxiety. The history is provided by the patient.  Anxiety Associated symptoms include shortness of breath. Pertinent negatives include no chest pain, no abdominal pain and no headaches.    Past Medical History  Diagnosis Date  . Panic attacks    Past Surgical History  Procedure Laterality Date  . Wisdom tooth extraction  11/2011   Family History  Problem Relation Age of Onset  . Other Mother     brain tumor  . Cancer Father    Social History  Substance Use Topics  . Smoking status: Never Smoker   . Smokeless tobacco: Never Used  . Alcohol Use: No   OB History    No data available     Review of Systems  Constitutional: Positive for fever, chills, activity change, appetite change and fatigue.  HENT: Positive for congestion, rhinorrhea and sore throat.   Respiratory: Positive for cough and shortness of breath. Negative for chest tightness.   Cardiovascular: Negative for chest pain.  Gastrointestinal: Positive for nausea and vomiting.  Negative for abdominal pain and diarrhea.  Genitourinary: Negative for dysuria, hematuria, vaginal bleeding and vaginal discharge.  Musculoskeletal: Positive for myalgias and arthralgias. Negative for neck pain and neck stiffness.  Neurological: Positive for weakness. Negative for light-headedness and headaches.  A complete 10 system review of systems was obtained and all systems are negative except as noted in the HPI and PMH.      Allergies  Review of patient's allergies indicates no known allergies.  Home Medications   Prior to Admission medications   Medication Sig Start Date End Date Taking? Authorizing Provider  escitalopram (LEXAPRO) 20 MG tablet take 1 tablet by mouth once daily 09/12/14  Yes Lazaro Arms, MD  [facility] 0.18/0.215/0.25 MG-35 MCG tablet take 1 tablet once daily 02/17/15  Yes Adline Potter, NP   BP 120/63 mmHg  Pulse 103  Temp(Src) 98.6 F (37 C) (Oral)  Resp 17  Ht  (1.549 m)  Wt 160 lb (72.576 kg)  BMI 30.25 kg/m2  SpO2 97%  LMP 04/09/2015 Physical Exam  Constitutional: She is oriented to person, place, and time. She appears well-developed and well-nourished. No distress.  Shaking chills  HENT:  Head: Normocephalic and atraumatic.  Mouth/Throat: Oropharynx is clear and moist. No oropharyngeal exudate.  Erythematous OP, no asymmetry, uvula midline, no exudate  Eyes: Conjunctivae and EOM are normal. Pupils are equal, round, and reactive to light.  Neck: Normal range of motion. Neck supple.  No meningismus.  Cardiovascular: Normal rate, normal heart sounds  and intact distal pulses.   No murmur heard. tachycardic  Pulmonary/Chest: Effort normal and breath sounds normal. No respiratory distress.  Abdominal: Soft. There is no tenderness. There is no rebound and no guarding.  Musculoskeletal: Normal range of motion. She exhibits no edema or tenderness.  Neurological: She is alert and oriented to person, place, and time. No cranial nerve  deficit. She exhibits normal muscle tone. Coordination normal.  No ataxia on finger to nose bilaterally. No pronator drift. 5/5 strength throughout. CN 2-12 intact.Equal grip strength. Sensation intact.   Skin: Skin is warm.  Psychiatric: She has a normal mood and affect. Her behavior is normal.  Nursing note and vitals reviewed.   ED Course  Procedures (including critical care time) Labs Review Labs Reviewed  RAPID STREP SCREEN (NOT AT [facility]) - Abnormal; Notable for the following:    Streptococcus, Group A Screen (Direct) POSITIVE (*)    All other components within normal limits  COMPREHENSIVE METABOLIC PANEL - Abnormal; Notable for the following:    Potassium 2.9 (*)    CO2 18 (*)    Glucose, Bld 137 (*)    Calcium 8.7 (*)    ALT 12 (*)    All other components within normal limits  CBC - Abnormal; Notable for the following:    WBC 26.0 (*)    All other components within normal limits  URINALYSIS, ROUTINE W REFLEX MICROSCOPIC (NOT AT [facility]) - Abnormal; Notable for the following:    Bilirubin Urine SMALL (*)    Ketones, ur >80 (*)    Leukocytes, UA TRACE (*)    All other components within normal limits  URINE MICROSCOPIC-ADD ON - Abnormal; Notable for the following:    Squamous Epithelial / LPF TOO NUMEROUS TO COUNT (*)    Bacteria, UA RARE (*)    All other components within normal limits  I-STAT CG4 LACTIC ACID, ED - Abnormal; Notable for the following:    Lactic Acid, Venous 3.64 (*)    All other components within normal limits  I-STAT CG4 LACTIC ACID, ED - Abnormal; Notable for the following:    Lactic Acid, Venous 2.36 (*)    All other components within normal limits  CULTURE, BLOOD (ROUTINE X 2)  CULTURE, BLOOD (ROUTINE X 2)  PREGNANCY, URINE  D-DIMER, QUANTITATIVE (NOT AT [facility])  I-STAT CG4 LACTIC ACID, ED    Imaging Review Dg Chest 2 View  04/19/2015  CLINICAL DATA:  Shortness of breath with panic attack today. EXAM: CHEST  2 VIEW COMPARISON:  08/23/2013 FINDINGS:  Lungs are hypoinflated without focal consolidation or effusion. Cardiomediastinal silhouette is within normal. Remaining bones and soft tissues are within normal. IMPRESSION: No active cardiopulmonary disease. Electronically Signed   By: Elberta Fortis M.D.   On: 04/19/2015 14:26   I have personally reviewed and evaluated these images and lab results as part of my medical decision-making.   EKG Interpretation   Date/Time:  Saturday April 19 2015 13:09:50 EDT Ventricular Rate:  98 PR Interval:  127 QRS Duration: 96 QT Interval:  382 QTC Calculation: 488 R Axis:   27 Text Interpretation:  Sinus rhythm Borderline T wave abnormalities  Borderline prolonged QT interval No previous ECGs available Confirmed by  Manus Gunning  MD, Bryn Perkin [id number]) on 04/19/2015 1:30:16 PM      MDM   Final diagnoses:  Strep pharyngitis  Dehydration   Episode of "panic attack" with palpitations, shortness of breath, shakiness and cramping in her hands. Now feeling fatigued. Also complaining of  sore throat. Patient appears ill on arrival and is tachycardic with leukocytosis. Rectal temp 102.5.  leukocytosis 26. Lactate 3.6. Patient given IV fluids, cultures obtained. Rapid strep is positive. D-dimer negative. CXR negative. UA with ketones, no infection. Hypokalemia repleted.  Patient will be treated with Bicillin. She is given 2 L of IV fluids for her tachycardia, fever and elevated lactic acid.  Lactate improving to 2.3. Tachycardia remains in the 110 range. Continue third liter IV fluid. Fever has resolved. Patient is tolerating by mouth he states she is feeling better. No chest pain or shortness of breath.  Dr. Hyacinth MeekerMiller to assume care and disposition after completion of fluid.    Glynn OctaveStephen Chayden Garrelts, MD 04/19/15 [phone]

## 2015-04-19 NOTE — Discharge Instructions (Signed)
Strep Throat Keep yourself hydrated. Follow up with your doctor next week. Return to the ED if you develop new or worsening symptoms. Strep throat is a bacterial infection of the throat. Your health care provider may call the infection tonsillitis or pharyngitis, depending on whether there is swelling in the tonsils or at the back of the throat. Strep throat is most common during the cold months of the year in children who are [phone] years of age, but it can happen during any season in people of any age. This infection is spread from person to person (contagious) through coughing, sneezing, or close contact. CAUSES Strep throat is caused by the bacteria called Streptococcus pyogenes. RISK FACTORS This condition is more likely to develop in:  People who spend time in crowded places where the infection can spread easily.  People who have close contact with someone who has strep throat. SYMPTOMS Symptoms of this condition include:  Fever or chills.   Redness, swelling, or pain in the tonsils or throat.  Pain or difficulty when swallowing.  White or yellow spots on the tonsils or throat.  Swollen, tender glands in the neck or under the jaw.  Red rash all over the body (rare). DIAGNOSIS This condition is diagnosed by performing a rapid strep test or by taking a swab of your throat (throat culture test). Results from a rapid strep test are usually ready in a few minutes, but throat culture test results are available after one or two days. TREATMENT This condition is treated with antibiotic medicine. HOME CARE INSTRUCTIONS Medicines  Take over-the-counter and prescription medicines only as told by your health care provider.  Take your antibiotic as told by your health care provider. Do not stop taking the antibiotic even if you start to feel better.  Have family members who also have a sore throat or fever tested for strep throat. They may need antibiotics if they have the strep  infection. Eating and Drinking  Do not share food, drinking cups, or personal items that could cause the infection to spread to other people.  If swallowing is difficult, try eating soft foods until your sore throat feels better.  Drink enough fluid to keep your urine clear or pale yellow. General Instructions  Gargle with a salt-water mixture 3-4 times per day or as needed. To make a salt-water mixture, completely dissolve -1 tsp of salt in 1 cup of warm water.  Make sure that all household members wash their hands well.  Get plenty of rest.  Stay home from school or work until you have been taking antibiotics for 24 hours.  Keep all follow-up visits as told by your health care provider. This is important. SEEK MEDICAL CARE IF:  The glands in your neck continue to get bigger.  You develop a rash, cough, or earache.  You cough up a thick liquid that is green, yellow-brown, or bloody.  You have pain or discomfort that does not get better with medicine.  Your problems seem to be getting worse rather than better.  You have a fever. SEEK IMMEDIATE MEDICAL CARE IF:  You have new symptoms, such as vomiting, severe headache, stiff or painful neck, chest pain, or shortness of breath.  You have severe throat pain, drooling, or changes in your voice.  You have swelling of the neck, or the skin on the neck becomes red and tender.  You have signs of dehydration, such as fatigue, dry mouth, and decreased urination.  You become increasingly sleepy, or  you cannot wake up completely.  Your joints become red or painful.   This information is not intended to replace advice given to you by your health care provider. Make sure you discuss any questions you have with your health care provider.   Document Released: 01/02/2000 Document Revised: 09/25/2014 Document Reviewed: 04/29/2014 Elsevier Interactive Patient Education [employer]

## 2015-04-19 NOTE — ED Notes (Signed)
Patient with no complaints at this time. Respirations even and unlabored. Skin warm/dry. Discharge instructions reviewed with patient at this time. Patient given opportunity to voice concerns/ask questions. IV removed per policy and band-aid applied to site. Patient discharged at this time and left Emergency Department with steady gait.  

## 2015-04-19 NOTE — ED Notes (Addendum)
Pt comes stating she had a panic attack this morning, comes in for evaluation. She complains of fatigue at this time. NAD noted.   Pt states she has vomited x2.

## 2015-04-24 LAB — CULTURE, BLOOD (ROUTINE X 2)
CULTURE: NO GROWTH
Culture: NO GROWTH

## 2015-06-05 ENCOUNTER — Other Ambulatory Visit: Payer: Self-pay | Admitting: Adult Health

## 2015-06-08 ENCOUNTER — Other Ambulatory Visit: Payer: Self-pay | Admitting: Adult Health

## 2015-07-29 ENCOUNTER — Emergency Department (HOSPITAL_COMMUNITY): Payer: Medicaid Other

## 2015-07-29 ENCOUNTER — Encounter (HOSPITAL_COMMUNITY): Payer: Self-pay | Admitting: Emergency Medicine

## 2015-07-29 ENCOUNTER — Emergency Department (HOSPITAL_COMMUNITY)
Admission: EM | Admit: 2015-07-29 | Discharge: 2015-07-29 | Disposition: A | Discharge status: Home / Self Care | Payer: Medicaid Other | Attending: Emergency Medicine | Admitting: Emergency Medicine

## 2015-07-29 DIAGNOSIS — Z791 Long term (current) use of non-steroidal anti-inflammatories (NSAID): Secondary | ICD-10-CM | POA: Insufficient documentation

## 2015-07-29 DIAGNOSIS — J020 Streptococcal pharyngitis: Secondary | ICD-10-CM | POA: Diagnosis not present

## 2015-07-29 DIAGNOSIS — M549 Dorsalgia, unspecified: Secondary | ICD-10-CM | POA: Diagnosis not present

## 2015-07-29 DIAGNOSIS — M79605 Pain in left leg: Secondary | ICD-10-CM | POA: Diagnosis present

## 2015-07-29 DIAGNOSIS — Z79899 Other long term (current) drug therapy: Secondary | ICD-10-CM | POA: Diagnosis not present

## 2015-07-29 LAB — CBC WITH DIFFERENTIAL/PLATELET
BASOS ABS: 0 10*3/uL (ref 0.0–0.1)
Basophils Relative: 0 %
EOS PCT: 0 %
Eosinophils Absolute: 0 10*3/uL (ref 0.0–0.7)
HEMATOCRIT: 36 % (ref 36.0–46.0)
Hemoglobin: 12.1 g/dL (ref 12.0–15.0)
LYMPHS ABS: 1.9 10*3/uL (ref 0.7–4.0)
LYMPHS PCT: 7 %
MCH: 28.7 pg (ref 26.0–34.0)
MCHC: 33.6 g/dL (ref 30.0–36.0)
MCV: 85.5 fL (ref 78.0–100.0)
MONO ABS: 1.4 10*3/uL — AB (ref 0.1–1.0)
Monocytes Relative: 5 %
NEUTROS ABS: 24.4 10*3/uL — AB (ref 1.7–7.7)
Neutrophils Relative %: 88 %
PLATELETS: 253 10*3/uL (ref 150–400)
RBC: 4.21 MIL/uL (ref 3.87–5.11)
RDW: 14 % (ref 11.5–15.5)
WBC: 27.7 10*3/uL — ABNORMAL HIGH (ref 4.0–10.5)

## 2015-07-29 LAB — COMPREHENSIVE METABOLIC PANEL
ALT: 11 U/L — AB (ref 14–54)
AST: 14 U/L — AB (ref 15–41)
Albumin: 3.6 g/dL (ref 3.5–5.0)
Alkaline Phosphatase: 70 U/L (ref 38–126)
Anion gap: 6 (ref 5–15)
BILIRUBIN TOTAL: 0.5 mg/dL (ref 0.3–1.2)
BUN: 10 mg/dL (ref 6–20)
CHLORIDE: 106 mmol/L (ref 101–111)
CO2: 22 mmol/L (ref 22–32)
CREATININE: 0.81 mg/dL (ref 0.44–1.00)
Calcium: 8.3 mg/dL — ABNORMAL LOW (ref 8.9–10.3)
Glucose, Bld: 135 mg/dL — ABNORMAL HIGH (ref 65–99)
Potassium: 2.9 mmol/L — ABNORMAL LOW (ref 3.5–5.1)
Sodium: 134 mmol/L — ABNORMAL LOW (ref 135–145)
TOTAL PROTEIN: 7.1 g/dL (ref 6.5–8.1)

## 2015-07-29 LAB — URINE MICROSCOPIC-ADD ON: Bacteria, UA: NONE SEEN

## 2015-07-29 LAB — URINALYSIS, ROUTINE W REFLEX MICROSCOPIC
BILIRUBIN URINE: NEGATIVE
Glucose, UA: NEGATIVE mg/dL
KETONES UR: 15 mg/dL — AB
NITRITE: NEGATIVE
Specific Gravity, Urine: 1.025 (ref 1.005–1.030)
pH: 6 (ref 5.0–8.0)

## 2015-07-29 LAB — RAPID STREP SCREEN (MED CTR MEBANE ONLY): STREPTOCOCCUS, GROUP A SCREEN (DIRECT): POSITIVE — AB

## 2015-07-29 MED ORDER — IBUPROFEN 800 MG PO TABS
800.0000 mg | ORAL_TABLET | Freq: Once | ORAL | Status: AC
  Administered 2015-07-29: 800 mg via ORAL
  Filled 2015-07-29: qty 1

## 2015-07-29 MED ORDER — PENICILLIN V POTASSIUM 500 MG PO TABS: 500.0000 mg | ORAL_TABLET | Freq: Four times a day (QID) | ORAL | Status: AC

## 2015-07-29 MED ORDER — ONDANSETRON 4 MG PO TBDP
4.0000 mg | ORAL_TABLET | Freq: Once | ORAL | Status: AC
  Administered 2015-07-29: 4 mg via ORAL
  Filled 2015-07-29: qty 1

## 2015-07-29 NOTE — ED Provider Notes (Signed)
CSN: [id number]     Arrival date & time 07/29/15  0940 History   First MD Initiated Contact with Patient 07/29/15 [phone]     Chief Complaint  Patient presents with  . Leg Pain     (Consider location/radiation/quality/duration/timing/severity/associated sxs/prior Treatment) Patient is a 35 y.o. female presenting with leg pain. The history is provided by the patient. No language interpreter was used.  Leg Pain Location:  Leg Time since incident:  4 days Injury: no   Leg location:  L leg Pain details:    Quality:  Aching   Radiates to:  Does not radiate   Severity:  Moderate   Onset quality:  Gradual   Duration:  4 days   Timing:  Constant   Progression:  Worsening Chronicity:  New Dislocation: no   Relieved by:  Nothing Worsened by:  Nothing tried Associated symptoms: back pain   Risk factors: no concern for non-accidental trauma     Past Medical History  Diagnosis Date  . Panic attacks    Past Surgical History  Procedure Laterality Date  . Wisdom tooth extraction  11/2011   Family History  Problem Relation Age of Onset  . Other Mother     brain tumor  . Cancer Father    Social History  Substance Use Topics  . Smoking status: Never Smoker   . Smokeless tobacco: Never Used  . Alcohol Use: No   OB History    No data available     Review of Systems  Musculoskeletal: Positive for back pain.  All other systems reviewed and are negative.     Allergies  Review of patient's allergies indicates no known allergies.  Home Medications   Prior to Admission medications   Medication Sig Start Date End Date Taking? Authorizing Provider  acetaminophen (TYLENOL) 160 MG/5ML liquid Take 15 mg/kg by mouth every 4 (four) hours as needed for fever.   Yes Historical Provider, MD  escitalopram (LEXAPRO) 20 MG tablet take 1 tablet by mouth once daily 09/12/14  Yes Lazaro Arms, MD  ibuprofen (ADVIL,MOTRIN) 400 MG tablet Take 400 mg by mouth every 6 (six) hours as needed.    Yes Historical Provider, MD  TRI-LINYAH 0.18/0.215/0.25 MG-35 MCG tablet take 1 tablet once daily 06/09/15  Yes Adline Potter, NP   BP 127/80 mmHg  Pulse 70  Temp(Src) 98.5 F (36.9 C) (Oral)  Resp 16  Ht  (1.549 m)  Wt 74.844 kg  BMI 31.19 kg/m2  SpO2 100%  LMP 06/29/2015 Physical Exam  Constitutional: She is oriented to person, place, and time. She appears well-developed and well-nourished.  HENT:  Head: Normocephalic and atraumatic.  Right Ear: External ear normal.  Left Ear: External ear normal.  Nose: Nose normal.  Mouth/Throat: Oropharynx is clear and moist.  Eyes: Conjunctivae and EOM are normal. Pupils are equal, round, and reactive to light.  Neck: Normal range of motion.  Cardiovascular: Normal rate and normal heart sounds.   Pulmonary/Chest: Effort normal.  Abdominal: She exhibits no distension.  Musculoskeletal: Normal range of motion.  Neurological: She is alert and oriented to person, place, and time.  Skin: Skin is warm.  Psychiatric: She has a normal mood and affect.  Nursing note and vitals reviewed.   ED Course  Procedures (including critical care time) Labs Review Labs Reviewed  CBC WITH DIFFERENTIAL/PLATELET - Abnormal; Notable for the following:    WBC 27.7 (*)    Neutro Abs 24.4 (*)    Monocytes Absolute  1.4 (*)    All other components within normal limits  COMPREHENSIVE METABOLIC PANEL - Abnormal; Notable for the following:    Sodium 134 (*)    Potassium 2.9 (*)    Glucose, Bld 135 (*)    Calcium 8.3 (*)    AST 14 (*)    ALT 11 (*)    All other components within normal limits  URINALYSIS, ROUTINE W REFLEX MICROSCOPIC (NOT AT [facility]) - Abnormal; Notable for the following:    Hgb urine dipstick SMALL (*)    Ketones, ur 15 (*)    Protein, ur TRACE (*)    Leukocytes, UA SMALL (*)    All other components within normal limits  URINE MICROSCOPIC-ADD ON - Abnormal; Notable for the following:    Squamous Epithelial / LPF TOO NUMEROUS TO  COUNT (*)    All other components within normal limits    Imaging Review No results found. I have personally reviewed and evaluated these images and lab results as part of my medical decision-making.   EKG Interpretation None      MDM strep positive    Final diagnoses:  Strep pharyngitis    Meds ordered this encounter  Medications  . ondansetron (ZOFRAN-ODT) disintegrating tablet 4 mg    Sig:   . ibuprofen (ADVIL,MOTRIN) tablet 800 mg    Sig:   . acetaminophen (TYLENOL) 160 MG/5ML liquid    Sig: Take 15 mg/kg by mouth every 4 (four) hours as needed for fever.  Marland Kitchen. ibuprofen (ADVIL,MOTRIN) 400 MG tablet    Sig: Take 400 mg by mouth every 6 (six) hours as needed.  . penicillin v potassium (VEETID) 500 MG tablet    Sig: Take 1 tablet (500 mg total) by mouth 4 (four) times daily.    Dispense:  40 tablet    Refill:  0    Order Specific Question:  Supervising Provider    Answer:  Eber HongMILLER, BRIAN [[id number]]   An After Visit Summary was printed and given to the patient.   Lonia SkinnerLeslie K LakeridgeSofia, PA-C 07/29/15 1447  Bethann BerkshireJoseph Zammit, MD 07/29/15 2037

## 2015-07-29 NOTE — ED Notes (Addendum)
Pt here c/o bilateral leg pain since Friday. Pt also reports sore throat and chills. States she vomited PTA.

## 2015-07-29 NOTE — Discharge Instructions (Signed)
Sore Throat °A sore throat is pain, burning, irritation, or scratchiness of the throat. There is often pain or tenderness when swallowing or talking. A sore throat may be accompanied by other symptoms, such as coughing, sneezing, fever, and swollen neck glands. A sore throat is often the first sign of another sickness, such as a cold, flu, strep throat, or mononucleosis (commonly known as mono). Most sore throats go away without medical treatment. °CAUSES  °The most common causes of a sore throat include: °· A viral infection, such as a cold, flu, or mono. °· A bacterial infection, such as strep throat, tonsillitis, or whooping cough. °· Seasonal allergies. °· Dryness in the air. °· Irritants, such as smoke or pollution. °· Gastroesophageal reflux disease (GERD). °HOME CARE INSTRUCTIONS  °· Only take over-the-counter medicines as directed by your caregiver. °· Drink enough fluids to keep your urine clear or pale yellow. °· Rest as needed. °· Try using throat sprays, lozenges, or sucking on hard candy to ease any pain (if older than 4 years or as directed). °· Sip warm liquids, such as broth, herbal tea, or warm water with honey to relieve pain temporarily. You may also eat or drink cold or frozen liquids such as frozen ice pops. °· Gargle with salt water (mix 1 tsp salt with 8 oz of water). °· Do not smoke and avoid secondhand smoke. °· Put a cool-mist humidifier in your bedroom at night to moisten the air. You can also turn on a hot shower and sit in the bathroom with the door closed for 5-10 minutes. °SEEK IMMEDIATE MEDICAL CARE IF: °· You have difficulty breathing. °· You are unable to swallow fluids, soft foods, or your saliva. °· You have increased swelling in the throat. °· Your sore throat does not get better in 7 days. °· You have nausea and vomiting. °· You have a fever or persistent symptoms for more than 2-3 days. °· You have a fever and your symptoms suddenly get worse. °MAKE SURE YOU:  °· Understand  these instructions. °· Will watch your condition. °· Will get help right away if you are not doing well or get worse. °  °This information is not intended to replace advice given to you by your health care provider. Make sure you discuss any questions you have with your health care provider. °  °Document Released: 02/12/2004 Document Revised: 01/25/2014 Document Reviewed: 09/12/2011 °Elsevier Interactive Patient Education ©2016 Elsevier Inc. °Strep Throat °Strep throat is a bacterial infection of the throat. Your health care provider may call the infection tonsillitis or pharyngitis, depending on whether there is swelling in the tonsils or at the back of the throat. Strep throat is most common during the cold months of the year in children who are 5-15 years of age, but it can happen during any season in people of any age. This infection is spread from person to person (contagious) through coughing, sneezing, or close contact. °CAUSES °Strep throat is caused by the bacteria called Streptococcus pyogenes. °RISK FACTORS °This condition is more likely to develop in: °· People who spend time in crowded places where the infection can spread easily. °· People who have close contact with someone who has strep throat. °SYMPTOMS °Symptoms of this condition include: °· Fever or chills.   °· Redness, swelling, or pain in the tonsils or throat. °· Pain or difficulty when swallowing. °· White or yellow spots on the tonsils or throat. °· Swollen, tender glands in the neck or under the jaw. °·   Red rash all over the body (rare). DIAGNOSIS This condition is diagnosed by performing a rapid strep test or by taking a swab of your throat (throat culture test). Results from a rapid strep test are usually ready in a few minutes, but throat culture test results are available after one or two days. TREATMENT This condition is treated with antibiotic medicine. HOME CARE INSTRUCTIONS Medicines  Take over-the-counter and prescription  medicines only as told by your health care provider.  Take your antibiotic as told by your health care provider. Do not stop taking the antibiotic even if you start to feel better.  Have family members who also have a sore throat or fever tested for strep throat. They may need antibiotics if they have the strep infection. Eating and Drinking  Do not share food, drinking cups, or personal items that could cause the infection to spread to other people.  If swallowing is difficult, try eating soft foods until your sore throat feels better.  Drink enough fluid to keep your urine clear or pale yellow. General Instructions  Gargle with a salt-water mixture 3-4 times per day or as needed. To make a salt-water mixture, completely dissolve -1 tsp of salt in 1 cup of warm water.  Make sure that all household members wash their hands well.  Get plenty of rest.  Stay home from school or work until you have been taking antibiotics for 24 hours.  Keep all follow-up visits as told by your health care provider. This is important. SEEK MEDICAL CARE IF:  The glands in your neck continue to get bigger.  You develop a rash, cough, or earache.  You cough up a thick liquid that is green, yellow-brown, or bloody.  You have pain or discomfort that does not get better with medicine.  Your problems seem to be getting worse rather than better.  You have a fever. SEEK IMMEDIATE MEDICAL CARE IF:  You have new symptoms, such as vomiting, severe headache, stiff or painful neck, chest pain, or shortness of breath.  You have severe throat pain, drooling, or changes in your voice.  You have swelling of the neck, or the skin on the neck becomes red and tender.  You have signs of dehydration, such as fatigue, dry mouth, and decreased urination.  You become increasingly sleepy, or you cannot wake up completely.  Your joints become red or painful.   This information is not intended to replace advice  given to you by your health care provider. Make sure you discuss any questions you have with your health care provider.   Document Released: 01/02/2000 Document Revised: 09/25/2014 Document Reviewed: 04/29/2014 Elsevier Interactive Patient Education [employer]2016 Elsevier Inc.

## 2015-09-30 ENCOUNTER — Other Ambulatory Visit: Payer: Self-pay | Admitting: Obstetrics & Gynecology

## 2015-11-19 ENCOUNTER — Other Ambulatory Visit: Payer: Self-pay | Admitting: Adult Health

## 2015-12-23 NOTE — Congregational Nurse Program (Signed)
Congregational Nurse Program Note  Date of Encounter: 12/16/2015  Past Medical History: Past Medical History:  Diagnosis Date  . Panic attacks     Encounter Details:     CNP Questionnaire - 12/16/15 1030      Patient Demographics   Is this a new or existing patient? New   Patient is considered a/an Not Applicable   Race Caucasian/White     Patient Assistance   Location of Patient Assistance [employer] [place] [facility] [place]   Patient's financial/insurance status Low Income;Self-Pay (Uninsured)   Uninsured Patient ([place] [employer]/Care Connects) Yes   Interventions Counseled to make appt. with provider   Patient referred to apply for the following financial assistance Medicaid   Food insecurities addressed Not Applicable   Transportation assistance No   Assistance securing medications No   Educational health offerings Navigating the healthcare system     Encounter Details   Primary purpose of visit Navigating the Healthcare System   Was an Emergency Department visit averted? Not Applicable   Does patient have a medical provider? Yes   Patient referred to Follow up with established PCP   Was a mental health screening completed? (GAINS tool) No   Does patient have dental issues? No   Does patient have vision issues? No   Does your patient have an abnormal blood pressure today? No   Since previous encounter, have you referred patient for abnormal blood pressure that resulted in a new diagnosis or medication change? No   Does your patient have an abnormal blood glucose today? No   Since previous encounter, have you referred patient for abnormal blood glucose that resulted in a new diagnosis or medication change? No   Was there a life-saving intervention made? No     New Client encountered during a blood pressure screening at the [employer].  Client encouraged to follow up as suggested by her established Primary Care Provider, DR Sherwood GamblerFusco.

## 2016-02-12 ENCOUNTER — Other Ambulatory Visit: Payer: Self-pay | Admitting: Adult Health

## 2016-03-11 ENCOUNTER — Other Ambulatory Visit: Payer: Self-pay | Admitting: Adult Health

## 2016-05-19 ENCOUNTER — Emergency Department (HOSPITAL_COMMUNITY)
Admission: EM | Admit: 2016-05-19 | Discharge: 2016-05-19 | Disposition: A | Discharge status: Home / Self Care | Payer: Medicaid Other | Attending: Emergency Medicine | Admitting: Emergency Medicine

## 2016-05-19 ENCOUNTER — Encounter (HOSPITAL_COMMUNITY): Payer: Self-pay | Admitting: Emergency Medicine

## 2016-05-19 DIAGNOSIS — R1032 Left lower quadrant pain: Secondary | ICD-10-CM | POA: Diagnosis present

## 2016-05-19 DIAGNOSIS — R197 Diarrhea, unspecified: Secondary | ICD-10-CM | POA: Insufficient documentation

## 2016-05-19 DIAGNOSIS — Z791 Long term (current) use of non-steroidal anti-inflammatories (NSAID): Secondary | ICD-10-CM | POA: Diagnosis not present

## 2016-05-19 DIAGNOSIS — N390 Urinary tract infection, site not specified: Secondary | ICD-10-CM | POA: Diagnosis not present

## 2016-05-19 LAB — CBC WITH DIFFERENTIAL/PLATELET
BASOS PCT: 0 %
Basophils Absolute: 0 10*3/uL (ref 0.0–0.1)
Eosinophils Absolute: 0 10*3/uL (ref 0.0–0.7)
Eosinophils Relative: 0 %
HCT: 41.7 % (ref 36.0–46.0)
HEMOGLOBIN: 14.6 g/dL (ref 12.0–15.0)
LYMPHS ABS: 0.8 10*3/uL (ref 0.7–4.0)
Lymphocytes Relative: 4 %
MCH: 29.9 pg (ref 26.0–34.0)
MCHC: 35 g/dL (ref 30.0–36.0)
MCV: 85.5 fL (ref 78.0–100.0)
Monocytes Absolute: 0.7 10*3/uL (ref 0.1–1.0)
Monocytes Relative: 4 %
NEUTROS PCT: 92 %
Neutro Abs: 17 10*3/uL — ABNORMAL HIGH (ref 1.7–7.7)
Platelets: 273 10*3/uL (ref 150–400)
RBC: 4.88 MIL/uL (ref 3.87–5.11)
RDW: 13 % (ref 11.5–15.5)
WBC: 18.5 10*3/uL — AB (ref 4.0–10.5)

## 2016-05-19 LAB — BASIC METABOLIC PANEL
Anion gap: 11 (ref 5–15)
BUN: 13 mg/dL (ref 6–20)
CHLORIDE: 108 mmol/L (ref 101–111)
CO2: 19 mmol/L — ABNORMAL LOW (ref 22–32)
Calcium: 9.6 mg/dL (ref 8.9–10.3)
Creatinine, Ser: 0.88 mg/dL (ref 0.44–1.00)
GFR calc non Af Amer: >60 mL/min (ref >60)
Glucose, Bld: 132 mg/dL — ABNORMAL HIGH (ref 65–99)
POTASSIUM: 3.5 mmol/L (ref 3.5–5.1)
Sodium: 138 mmol/L (ref 135–145)

## 2016-05-19 LAB — URINALYSIS, ROUTINE W REFLEX MICROSCOPIC
Bilirubin Urine: NEGATIVE
GLUCOSE, UA: NEGATIVE mg/dL
Hgb urine dipstick: NEGATIVE
KETONES UR: 20 mg/dL — AB
Leukocytes, UA: NEGATIVE
Nitrite: NEGATIVE
PH: 5 (ref 5.0–8.0)
Protein, ur: 30 mg/dL — AB
RBC / HPF: NONE SEEN RBC/hpf (ref 0–5)
Specific Gravity, Urine: 1.024 (ref 1.005–1.030)

## 2016-05-19 LAB — PREGNANCY, URINE: Preg Test, Ur: NEGATIVE

## 2016-05-19 MED ORDER — ONDANSETRON HCL 4 MG/2ML IJ SOLN
4.0000 mg | Freq: Once | INTRAMUSCULAR | Status: AC
  Administered 2016-05-19: 4 mg via INTRAVENOUS
  Filled 2016-05-19: qty 2

## 2016-05-19 MED ORDER — CIPROFLOXACIN HCL 500 MG PO TABS: 500.0000 mg | ORAL_TABLET | Freq: Two times a day (BID) | ORAL | 0 refills | Status: DC

## 2016-05-19 MED ORDER — ONDANSETRON 4 MG PO TBDP: ORAL_TABLET | ORAL | 0 refills | Status: DC

## 2016-05-19 MED ORDER — CIPROFLOXACIN IN D5W 400 MG/200ML IV SOLN
400.0000 mg | Freq: Once | INTRAVENOUS | Status: AC
Start: 2016-05-19 — End: 2016-05-19
  Administered 2016-05-19: 400 mg via INTRAVENOUS
  Filled 2016-05-19: qty 200

## 2016-05-19 MED ORDER — KETOROLAC TROMETHAMINE 30 MG/ML IJ SOLN
30.0000 mg | Freq: Once | INTRAMUSCULAR | Status: AC
  Administered 2016-05-19: 30 mg via INTRAVENOUS
  Filled 2016-05-19: qty 1

## 2016-05-19 MED ORDER — SODIUM CHLORIDE 0.9 % IV BOLUS (SEPSIS)
1000.0000 mL | Freq: Once | INTRAVENOUS | Status: AC
  Administered 2016-05-19: 1000 mL via INTRAVENOUS

## 2016-05-19 NOTE — ED Triage Notes (Signed)
Pt c/o LLQ pain with n/v starting this am. Pt has hx of panic attacks. Pt had mild shaking and hyperventilating while in triage. Pt comforted and advised of sloww deep breathing. Pt better at end of triage. Mm moist.

## 2016-05-19 NOTE — ED Notes (Signed)
Pt given ginger ale.

## 2016-05-19 NOTE — Discharge Instructions (Signed)
Drink plenty of fluids. Take Tylenol or Motrin for pain. Follow-up with her doctor next week for recheck. Return sooner if problems

## 2016-05-19 NOTE — ED Provider Notes (Signed)
[facility] DEPT Provider Note   CSN: [id number] Arrival date & time: 05/19/16  1414     History   Chief Complaint Chief Complaint  Patient presents with  . Abdominal Pain  . Emesis    HPI Michelle Pace is a 36 y.o. female.  Patient complains of nausea vomiting some diarrhea and some abdominal cramping. No fevers chills   The history is provided by the patient. No language interpreter was used.  Abdominal Pain   This is a new problem. The current episode started 6 to 12 hours ago. The problem occurs rarely. The problem has been resolved. The pain is associated with an unknown factor. The pain is located in the LLQ. The quality of the pain is aching. The pain is at a severity of 3/10. Associated symptoms include vomiting. Pertinent negatives include diarrhea, frequency, hematuria and headaches.  Emesis   Associated symptoms include abdominal pain. Pertinent negatives include no cough, no diarrhea and no headaches.    Past Medical History:  Diagnosis Date  . Panic attacks     There are no active problems to display for this patient.   Past Surgical History:  Procedure Laterality Date  . WISDOM TOOTH EXTRACTION  11/2011    OB History    No data available       Home Medications    Prior to Admission medications   Medication Sig Start Date End Date Taking? Authorizing Provider  escitalopram (LEXAPRO) 20 MG tablet take 1 tablet by mouth once daily 09/30/15  Yes Lazaro Arms, MD  ibuprofen (ADVIL,MOTRIN) 400 MG tablet Take 400 mg by mouth every 6 (six) hours as needed.   Yes Historical Provider, MD  TRI-LINYAH 0.18/0.215/0.25 MG-35 MCG tablet take 1 tablet by mouth once daily 03/15/16  Yes Adline Potter, NP  ciprofloxacin (CIPRO) 500 MG tablet Take 1 tablet (500 mg total) by mouth 2 (two) times daily. One po bid x 7 days 05/19/16   Bethann Berkshire, MD  ondansetron [facility] ODT) 4 MG disintegrating tablet  ODT q4 hours prn nausea/vomit 05/19/16   Bethann Berkshire, MD     Family History Family History  Problem Relation Age of Onset  . Other Mother     brain tumor  . Cancer Father     Social History Social History  Substance Use Topics  . Smoking status: Never Smoker  . Smokeless tobacco: Never Used  . Alcohol use No     Allergies   Patient has no known allergies.   Review of Systems Review of Systems  Constitutional: Negative for appetite change and fatigue.  HENT: Negative for congestion, ear discharge and sinus pressure.   Eyes: Negative for discharge.  Respiratory: Negative for cough.   Cardiovascular: Negative for chest pain.  Gastrointestinal: Positive for abdominal pain and vomiting. Negative for diarrhea.  Genitourinary: Negative for frequency and hematuria.  Musculoskeletal: Negative for back pain.  Skin: Negative for rash.  Neurological: Negative for seizures and headaches.  Psychiatric/Behavioral: Negative for hallucinations.     Physical Exam Updated Vital Signs BP (!) 133/92 (BP Location: Left Arm)   Pulse 99   Temp 98.6 F (37 C) (Oral)   Resp 18   Ht  (1.499 m)   Wt 160 lb (72.6 kg)   LMP 04/28/2016   SpO2 100%   BMI 32.32 kg/m   Physical Exam  Constitutional: She is oriented to person, place, and time. She appears well-developed.  HENT:  Head: Normocephalic.  Eyes: Conjunctivae and EOM  are normal. No scleral icterus.  Neck: Neck supple. No thyromegaly present.  Cardiovascular: Normal rate and regular rhythm.  Exam reveals no gallop and no friction rub.   No murmur heard. Pulmonary/Chest: No stridor. She has no wheezes. She has no rales. She exhibits no tenderness.  Abdominal: She exhibits no distension. There is no tenderness. There is no rebound.  Musculoskeletal: Normal range of motion. She exhibits no edema.  Lymphadenopathy:    She has no cervical adenopathy.  Neurological: She is oriented to person, place, and time. She exhibits normal muscle tone. Coordination normal.  Skin: No rash  noted. No erythema.  Psychiatric: She has a normal mood and affect. Her behavior is normal.     ED Treatments / Results  Labs (all labs ordered are listed, but only abnormal results are displayed) Labs Reviewed  CBC WITH DIFFERENTIAL/PLATELET - Abnormal; Notable for the following:       Result Value   WBC 18.5 (*)    Neutro Abs 17.0 (*)    All other components within normal limits  BASIC METABOLIC PANEL - Abnormal; Notable for the following:    CO2 19 (*)    Glucose, Bld 132 (*)    All other components within normal limits  URINALYSIS, ROUTINE W REFLEX MICROSCOPIC - Abnormal; Notable for the following:    APPearance TURBID (*)    Ketones, ur 20 (*)    Protein, ur 30 (*)    Bacteria, UA FEW (*)    Squamous Epithelial / LPF 6-30 (*)    All other components within normal limits  URINE CULTURE  PREGNANCY, URINE    EKG  EKG Interpretation None       Radiology No results found.  Procedures Procedures (including critical care time)  Medications Ordered in ED Medications  sodium chloride 0.9 % bolus 1,000 mL (1,000 mLs Intravenous New Bag/Given 05/19/16 1835)  ondansetron (ZOFRAN) injection 4 mg (4 mg Intravenous Given 05/19/16 1835)  ketorolac (TORADOL) 30 MG/ML injection 30 mg (30 mg Intravenous Given 05/19/16 1835)  ciprofloxacin (CIPRO) IVPB 400 mg (400 mg Intravenous New Bag/Given 05/19/16 1835)     Initial Impression / Assessment and Plan / ED Course  I have reviewed the triage vital signs and the nursing notes.  Pertinent labs & imaging results that were available during my care of the patient were reviewed by me and considered in my medical decision making (see chart for details).     Patient with vomiting and diarrhea. Labs suggest urinary tract infection. Patient no longer having abdominal pain. We will culture her urine put on Cipro and Zofran and have her follow-up with her PCP  Final Clinical Impressions(s) / ED Diagnoses   Final diagnoses:  Lower urinary  tract infectious disease    New Prescriptions New Prescriptions   CIPROFLOXACIN (CIPRO) 500 MG TABLET    Take 1 tablet (500 mg total) by mouth 2 (two) times daily. One po bid x 7 days   ONDANSETRON (ZOFRAN ODT) 4 MG DISINTEGRATING TABLET     ODT q4 hours prn nausea/vomit     Bethann Berkshire, MD 05/19/16 1941

## 2016-05-19 NOTE — ED Notes (Signed)
IV 20 gauge to right AC removed- cath intact-bleeding controlled- bandaid applied.

## 2016-05-21 LAB — URINE CULTURE

## 2016-09-26 ENCOUNTER — Other Ambulatory Visit: Payer: Self-pay | Admitting: Adult Health

## 2016-09-28 ENCOUNTER — Other Ambulatory Visit: Payer: Self-pay | Admitting: Adult Health

## 2016-10-12 ENCOUNTER — Encounter: Payer: Self-pay | Admitting: Advanced Practice Midwife

## 2016-10-12 ENCOUNTER — Other Ambulatory Visit (HOSPITAL_COMMUNITY)
Admission: RE | Admit: 2016-10-12 | Discharge: 2016-10-12 | Disposition: A | Discharge status: Home / Self Care | Payer: Medicaid Other | Source: Ambulatory Visit | Attending: Obstetrics & Gynecology | Admitting: Obstetrics & Gynecology

## 2016-10-12 ENCOUNTER — Ambulatory Visit (INDEPENDENT_AMBULATORY_CARE_PROVIDER_SITE_OTHER): Payer: Medicaid Other | Admitting: Advanced Practice Midwife

## 2016-10-12 VITALS — BP 126/90 | HR 115 | Ht 61.0 in | Wt 172.0 lb

## 2016-10-12 DIAGNOSIS — Z1151 Encounter for screening for human papillomavirus (HPV): Secondary | ICD-10-CM | POA: Insufficient documentation

## 2016-10-12 DIAGNOSIS — Z01419 Encounter for gynecological examination (general) (routine) without abnormal findings: Secondary | ICD-10-CM | POA: Diagnosis not present

## 2016-10-12 MED ORDER — NORGESTIM-ETH ESTRAD TRIPHASIC 0.18/0.215/0.25 MG-35 MCG PO TABS: 1.0000 | ORAL_TABLET | Freq: Every day | ORAL | 12 refills | Status: DC

## 2016-10-12 MED ORDER — ESCITALOPRAM OXALATE 20 MG PO TABS: 20.0000 mg | ORAL_TABLET | Freq: Every day | ORAL | 11 refills | Status: DC

## 2016-10-12 NOTE — Patient Instructions (Signed)
  [facility] [phone]  [address]  [place], [place] [address]

## 2016-10-12 NOTE — Addendum Note (Signed)
Addended by: Moss Mc on: 10/12/2016 04:10 PM   Modules accepted: Orders

## 2016-10-12 NOTE — Progress Notes (Signed)
Baxter Hire 36 y.o.  Vitals:   10/12/16 1528  BP: 126/90  Pulse: (!) 115     Filed Weights   10/12/16 1528  Weight: 172 lb (78 kg)    Past Medical History: Past Medical History:  Diagnosis Date  . Panic attacks     Past Surgical History: Past Surgical History:  Procedure Laterality Date  . WISDOM TOOTH EXTRACTION  11/2011    Family History: Family History  Problem Relation Age of Onset  . Other Mother        brain tumor  . Cancer Father     Social History: Social History  Substance Use Topics  . Smoking status: Never Smoker  . Smokeless tobacco: Never Used  . Alcohol use No    Allergies: No Known Allergies    Current Outpatient Prescriptions:  .  escitalopram (LEXAPRO) 20 MG tablet, take 1 tablet by mouth once daily, Disp: 30 tablet, Rfl: 11 .  TRI-LINYAH 0.18/0.215/0.25 MG-35 MCG tablet, take 1 tablet by mouth once daily, Disp: 28 tablet, Rfl: 6 .  ibuprofen (ADVIL,MOTRIN) 400 MG tablet, Take 400 mg by mouth every 6 (six) hours as needed., Disp: , Rfl:  .  ondansetron (ZOFRAN ODT) 4 MG disintegrating tablet,  ODT q4 hours prn nausea/vomit (Patient not taking: Reported on 10/12/2016), Disp: 12 tablet, Rfl: 0  History of Present Illness: Here for pap and physical.  Last pap 1/19, normal.  On Tri-Linya COCs, has been out for about 2 weeks.  Has been having unprotected sex.  Knows she could get pregnant.  Happy with this method.  ````````````````````````   Review of Systems   Patient denies any headaches, blurred vision, shortness of breath, chest pain, abdominal pain, problems with bowel movements, urination, or intercourse.   Physical Exam: General:  Well developed, well nourished, no acute distress Skin:  Warm and dry.  2 sl raised moles on back, one on right bicolored. Pt unsure if its changing or not Neck:  Midline trachea, normal thyroid Lungs; Clear to auscultation bilaterally Breast:  No dominant palpable mass, retraction, or nipple  discharge Cardiovascular: Regular rate and rhythm Abdomen:  Soft, non tender, no hepatosplenomegaly Pelvic:  External genitalia is normal in appearance.  The vagina is normal in appearance.  The cervix is bulbous.  Uterus is felt to be normal size, shape, and contour.  No adnexal masses or tenderness noted. Exam limited by habitus Extremities:  No swelling or varicosities noted Psych:  No mood changes.     Impression: normal GYN exam 1 suspicious mole on back     Plan: if pap normal, repeat q 3 years.  Go see a dermatologist about mole on back

## 2016-10-14 LAB — CYTOLOGY - PAP
Chlamydia: NEGATIVE
DIAGNOSIS: NEGATIVE
HPV: NOT DETECTED
Neisseria Gonorrhea: NEGATIVE

## 2017-02-27 ENCOUNTER — Emergency Department (HOSPITAL_COMMUNITY)
Admission: EM | Admit: 2017-02-27 | Discharge: 2017-02-27 | Disposition: A | Discharge status: Home / Self Care | Payer: Medicaid Other | Attending: Emergency Medicine | Admitting: Emergency Medicine

## 2017-02-27 ENCOUNTER — Encounter (HOSPITAL_COMMUNITY): Payer: Self-pay | Admitting: Emergency Medicine

## 2017-02-27 ENCOUNTER — Other Ambulatory Visit: Payer: Self-pay

## 2017-02-27 DIAGNOSIS — M545 Low back pain, unspecified: Secondary | ICD-10-CM

## 2017-02-27 DIAGNOSIS — N393 Stress incontinence (female) (male): Secondary | ICD-10-CM | POA: Diagnosis not present

## 2017-02-27 DIAGNOSIS — Z79899 Other long term (current) drug therapy: Secondary | ICD-10-CM | POA: Insufficient documentation

## 2017-02-27 LAB — POC URINE PREG, ED: PREG TEST UR: NEGATIVE

## 2017-02-27 MED ORDER — CYCLOBENZAPRINE HCL 5 MG PO TABS: 5.0000 mg | ORAL_TABLET | Freq: Three times a day (TID) | ORAL | 0 refills | Status: DC | PRN

## 2017-02-27 MED ORDER — IBUPROFEN 800 MG PO TABS: 800.0000 mg | ORAL_TABLET | Freq: Three times a day (TID) | ORAL | 0 refills | Status: DC

## 2017-02-27 MED ORDER — KETOROLAC TROMETHAMINE 60 MG/2ML IM SOLN
60.0000 mg | Freq: Once | INTRAMUSCULAR | Status: AC
  Administered 2017-02-27: 60 mg via INTRAMUSCULAR
  Filled 2017-02-27: qty 2

## 2017-02-27 NOTE — Discharge Instructions (Signed)
Avoid lifting,  Bending,  Twisting or any other activity that worsens your pain over the next week.  Apply a heating pad to your lower back for 20 minutes several times daily.   You should get rechecked if your symptoms are not better over the next 5 days,  Or you develop increased pain,  Weakness in your leg(s) or loss of bladder or bowel function - these are symptoms of a worse injury.

## 2017-02-27 NOTE — ED Notes (Signed)
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

## 2017-02-27 NOTE — ED Notes (Signed)
JI in to assess 

## 2017-02-27 NOTE — ED Triage Notes (Signed)
Patient c/o lower back pain that radiates into lower legs bilaterally. Patient denies any known injury. Patient denies any complications with urination or BM. CNS intact. Patient took tylenol at 8am with no relief.

## 2017-02-27 NOTE — ED Provider Notes (Signed)
[facility] EMERGENCY DEPARTMENT Provider Note   CSN: [id number] Arrival date & time: 02/27/17  1517     History   Chief Complaint Chief Complaint  Patient presents with  . Back Pain    HPI Michelle Pace is a 37 y.o. female with no significant history of back pain or injury presenting with a 2-day history of bilateral lower back pain with radiation into her left upper thigh and her right posterior buttock.  She denies injuries, falls or direct blows but does endorse "overdoing it" a couple of days ago cleaning her house.  Pain is worse with movement.  She denies weakness or numbness in her lower extremities and has had no urinary or fecal incontinence or retention.  She does note what sounds like stress incontinence with sneezing or coughing, but this is a chronic problem.  She has no history of cancer, no IV drug use.  She has taken 2 Tylenol early this morning with modest temporary improvement only.  The history is provided by the patient.    Past Medical History:  Diagnosis Date  . Panic attacks     There are no active problems to display for this patient.   Past Surgical History:  Procedure Laterality Date  . WISDOM TOOTH EXTRACTION  11/2011    OB History    No data available       Home Medications    Prior to Admission medications   Medication Sig Start Date End Date Taking? Authorizing Provider  cyclobenzaprine (FLEXERIL) 5 MG tablet Take 1 tablet (5 mg total) by mouth 3 (three) times daily as needed for muscle spasms. 02/27/17   Burgess AmorIdol, Manning Luna, PA-C  escitalopram (LEXAPRO) 20 MG tablet Take 1 tablet (20 mg total) by mouth daily. 10/12/16   Cresenzo-Dishmon, Scarlette CalicoFrances, CNM  ibuprofen (ADVIL,MOTRIN) 800 MG tablet Take 1 tablet (800 mg total) by mouth 3 (three) times daily. 02/27/17   Burgess AmorIdol, Lenyx Boody, PA-C  Norgestimate-Ethinyl Estradiol Triphasic (TRI-LINYAH) 0.18/0.215/0.25 MG-35 MCG tablet Take 1 tablet by mouth daily. 10/12/16   Cresenzo-Dishmon, Scarlette CalicoFrances, CNM    ondansetron (ZOFRAN ODT) 4 MG disintegrating tablet 4mg  ODT q4 hours prn nausea/vomit Patient not taking: Reported on 10/12/2016 05/19/16   Bethann BerkshireZammit, Joseph, MD    Family History Family History  Problem Relation Age of Onset  . Other Mother        brain tumor  . Cancer Father     Social History Social History   Tobacco Use  . Smoking status: Never Smoker  . Smokeless tobacco: Never Used  Substance Use Topics  . Alcohol use: No  . Drug use: No     Allergies   Patient has no known allergies.   Review of Systems Review of Systems  Constitutional: Negative for fever.  Respiratory: Negative for shortness of breath.   Cardiovascular: Negative for chest pain and leg swelling.  Gastrointestinal: Negative for abdominal distention, abdominal pain and constipation.  Genitourinary: Negative for difficulty urinating, dysuria, flank pain, frequency and urgency.  Musculoskeletal: Positive for back pain. Negative for gait problem and joint swelling.  Skin: Negative for rash.  Neurological: Negative for weakness and numbness.     Physical Exam Updated Vital Signs BP (!) 139/91   Pulse 89   Temp 98.3 F (36.8 C) (Oral)   Resp 20   LMP 02/25/2017   SpO2 100%   Physical Exam  Constitutional: She appears well-developed and well-nourished.  HENT:  Head: Normocephalic.  Eyes: Conjunctivae are normal.  Neck: Normal range of  motion. Neck supple.  Cardiovascular: Normal rate and intact distal pulses.  Pedal pulses normal.  Pulmonary/Chest: Effort normal.  Abdominal: Soft. Bowel sounds are normal. She exhibits no distension and no mass.  Musculoskeletal: Normal range of motion. She exhibits no edema.       Lumbar back: She exhibits tenderness. She exhibits no swelling, no edema and no spasm.  Neurological: She is alert. She has normal strength. She displays no atrophy and no tremor. No sensory deficit. Gait normal.  Reflex Scores:      Patellar reflexes are 2+ on the right side and  2+ on the left side. No strength deficit noted in hip and knee flexor and extensor muscle groups.  Ankle flexion and extension intact.  Normal sensation bilateral lower extremities  Skin: Skin is warm and dry.  Psychiatric: She has a normal mood and affect.  Nursing note and vitals reviewed.    ED Treatments / Results  Labs (all labs ordered are listed, but only abnormal results are displayed) Labs Reviewed  POC URINE PREG, ED    EKG  EKG Interpretation None       Radiology No results found.  Procedures Procedures (including critical care time)  Medications Ordered in ED Medications  ketorolac (TORADOL) injection 60 mg (not administered)     Initial Impression / Assessment and Plan / ED Course  I have reviewed the triage vital signs and the nursing notes.  Pertinent labs & imaging results that were available during my care of the patient were reviewed by me and considered in my medical decision making (see chart for details).     Patient with mechanical low back pain which is reproducible with palpation and movement.  She has no neurologic deficits.  Discussed home treatments including heat therapy, activity as tolerated.  She will be placed on ibuprofen and Flexeril.  Discussed strict return precautions.  Also discussed Kegle exercises given her history of chronic symptoms suggesting stress incontinence.  Final Clinical Impressions(s) / ED Diagnoses   Final diagnoses:  Acute bilateral low back pain without sciatica  Stress incontinence    ED Discharge Orders        Ordered    ibuprofen (ADVIL,MOTRIN) 800 MG tablet  3 times daily     02/27/17 1931    cyclobenzaprine (FLEXERIL) 5 MG tablet  3 times daily PRN     02/27/17 1931       Burgess Amor, PA-C 02/27/17 1933    Vanetta Mulders, MD 02/28/17 [id number]

## 2017-04-08 ENCOUNTER — Other Ambulatory Visit (HOSPITAL_COMMUNITY): Payer: Self-pay | Admitting: Physician Assistant

## 2017-04-08 ENCOUNTER — Encounter (HOSPITAL_COMMUNITY): Payer: Self-pay | Admitting: Cardiology

## 2017-04-08 ENCOUNTER — Ambulatory Visit (HOSPITAL_COMMUNITY)
Admission: RE | Admit: 2017-04-08 | Discharge: 2017-04-08 | Disposition: A | Discharge status: Home / Self Care | Payer: Medicaid Other | Source: Ambulatory Visit | Attending: Physician Assistant | Admitting: Physician Assistant

## 2017-04-08 ENCOUNTER — Emergency Department (HOSPITAL_COMMUNITY)
Admission: EM | Admit: 2017-04-08 | Discharge: 2017-04-08 | Disposition: A | Discharge status: Home / Self Care | Payer: Medicaid Other | Attending: Emergency Medicine | Admitting: Emergency Medicine

## 2017-04-08 DIAGNOSIS — J029 Acute pharyngitis, unspecified: Secondary | ICD-10-CM | POA: Diagnosis not present

## 2017-04-08 DIAGNOSIS — M79605 Pain in left leg: Secondary | ICD-10-CM | POA: Insufficient documentation

## 2017-04-08 DIAGNOSIS — M79604 Pain in right leg: Secondary | ICD-10-CM

## 2017-04-08 DIAGNOSIS — Z79899 Other long term (current) drug therapy: Secondary | ICD-10-CM | POA: Diagnosis not present

## 2017-04-08 LAB — URINALYSIS, ROUTINE W REFLEX MICROSCOPIC
BILIRUBIN URINE: NEGATIVE
Glucose, UA: NEGATIVE mg/dL
KETONES UR: 80 mg/dL — AB
Nitrite: NEGATIVE
Protein, ur: 30 mg/dL — AB
SPECIFIC GRAVITY, URINE: 1.025 (ref 1.005–1.030)
pH: 5 (ref 5.0–8.0)

## 2017-04-08 LAB — RAPID STREP SCREEN (MED CTR MEBANE ONLY): STREPTOCOCCUS, GROUP A SCREEN (DIRECT): NEGATIVE

## 2017-04-08 LAB — POC URINE PREG, ED: PREG TEST UR: NEGATIVE

## 2017-04-08 NOTE — ED Provider Notes (Signed)
[facility] EMERGENCY DEPARTMENT Provider Note   CSN: [id number] Arrival date & time: 04/08/17  4098     History   Chief Complaint No chief complaint on file. Chief complaint leg pain  HPI Michelle Pace is a 37 y.o. female.  HPI complains of bilateral shin pain which started 2 nights ago.  She was seen by APP at her primary care physician's office yesterday, she also complained of nasal congestion and sore throat she was started on gabapentin and doxycycline.  She reports that she became short of breath last night and thought she may have been having a panic attack she is no longer short of breath she also complained of right flank pain last night which has since resolved.  She has no urinary symptoms no fever no cough no shortness of breath presently.  She think she was having a panic attack last night.  She presently feels mildly anxious.  No other associated symptoms shin pain is worse with walking or weightbearing and improved with nonweightbearing patient had out patient x-rays of bilateral tibias/fibulas performed today which were normal.  Past Medical History:  Diagnosis Date  . Panic attacks     There are no active problems to display for this patient.   Past Surgical History:  Procedure Laterality Date  . WISDOM TOOTH EXTRACTION  11/2011    OB History   None      Home Medications    Prior to Admission medications   Medication Sig Start Date End Date Taking? Authorizing Provider  cyclobenzaprine (FLEXERIL) 5 MG tablet Take 1 tablet (5 mg total) by mouth 3 (three) times daily as needed for muscle spasms. 02/27/17   Burgess Amor, PA-C  escitalopram (LEXAPRO) 20 MG tablet Take 1 tablet (20 mg total) by mouth daily. 10/12/16   Cresenzo-Dishmon, Scarlette Calico, CNM  ibuprofen (ADVIL,MOTRIN) 800 MG tablet Take 1 tablet (800 mg total) by mouth 3 (three) times daily. 02/27/17   Burgess Amor, PA-C  Norgestimate-Ethinyl Estradiol Triphasic (TRI-LINYAH) 0.18/0.215/0.25 MG-35 MCG  tablet Take 1 tablet by mouth daily. 10/12/16   Cresenzo-Dishmon, Scarlette Calico, CNM  ondansetron (ZOFRAN ODT) 4 MG disintegrating tablet 4mg  ODT q4 hours prn nausea/vomit Patient not taking: Reported on 10/12/2016 05/19/16   Bethann Berkshire, MD    Family History Family History  Problem Relation Age of Onset  . Other Mother        brain tumor  . Cancer Father     Social History Social History   Tobacco Use  . Smoking status: Never Smoker  . Smokeless tobacco: Never Used  Substance Use Topics  . Alcohol use: No  . Drug use: No     Allergies   Patient has no known allergies.   Review of Systems Review of Systems  Constitutional: Negative.   HENT: Positive for sore throat.        Sore throat resolved  Respiratory: Positive for shortness of breath.        Shortness of breath resolved  Cardiovascular: Negative.   Gastrointestinal: Negative.   Genitourinary: Positive for flank pain.  Musculoskeletal:       BiLateral shin pain  Skin: Negative.   Neurological: Negative.   Psychiatric/Behavioral: The patient is nervous/anxious.   All other systems reviewed and are negative.    Physical Exam Updated Vital Signs BP (!) 132/95 (BP Location: Right Arm)   Pulse (!) 115   Temp 97.9 F (36.6 C) (Oral)   Resp 18   Wt 74.4 kg (164 lb)  LMP 03/18/2017   SpO2 96%   BMI 30.99 kg/m   Physical Exam  Constitutional: She appears well-developed and well-nourished. No distress.  HENT:  Head: Normocephalic and atraumatic.  Right Ear: External ear normal.  Left Ear: External ear normal.  Tonsils large and red bilaterally.  Uvula midline.  Tonsils are not touching.  No exudate  Eyes: Pupils are equal, round, and reactive to light. Conjunctivae are normal.  Neck: Neck supple. No tracheal deviation present. No thyromegaly present.  Cardiovascular: Normal rate and regular rhythm.  No murmur heard. Pulmonary/Chest: Effort normal and breath sounds normal.  Abdominal: Soft. Bowel sounds  are normal. She exhibits no distension. There is no tenderness.  Musculoskeletal: Normal range of motion. She exhibits no edema or tenderness.  4 extremities without redness swelling or tenderness neurovascular intact.  Shins are nontender bilaterally.  DP pulses 2+ bilaterally.  Good capillary refill  Neurological: She is alert. Coordination normal.  Skin: Skin is warm and dry. Capillary refill takes less than 2 seconds. No rash noted.  Psychiatric: She has a normal mood and affect.  Nursing note and vitals reviewed.    ED Treatments / Results  Labs (all labs ordered are listed, but only abnormal results are displayed) Labs Reviewed  RAPID STREP SCREEN (NOT AT [facility])  URINALYSIS, ROUTINE W REFLEX MICROSCOPIC  POC URINE PREG, ED    EKG  EKG Interpretation None       Radiology No results found.  Procedures Procedures (including critical care time)  Medications Ordered in ED Medications - No data to display  X-rays from earlier today reviewed by me and read as normal by radiologist Results for orders placed or performed during the hospital encounter of 04/08/17  Rapid Strep Screen (Not at [facility])  Result Value Ref Range   Streptococcus, Group A Screen (Direct) NEGATIVE NEGATIVE  Urinalysis, Routine w reflex microscopic  Result Value Ref Range   Color, Urine YELLOW YELLOW   APPearance HAZY (A) CLEAR   Specific Gravity, Urine 1.025 1.005 - 1.030   pH 5.0 5.0 - 8.0   Glucose, UA NEGATIVE NEGATIVE mg/dL   Hgb urine dipstick SMALL (A) NEGATIVE   Bilirubin Urine NEGATIVE NEGATIVE   Ketones, ur 80 (A) NEGATIVE mg/dL   Protein, ur 30 (A) NEGATIVE mg/dL   Nitrite NEGATIVE NEGATIVE   Leukocytes, UA SMALL (A) NEGATIVE   RBC / HPF 0-5 0 - 5 RBC/hpf   WBC, UA 6-30 0 - 5 WBC/hpf   Bacteria, UA RARE (A) NONE SEEN   Squamous Epithelial / LPF 6-30 (A) NONE SEEN   Mucus PRESENT    Ca Oxalate Crys, UA PRESENT   POC urine preg, ED (not at [facility])  Result Value Ref Range   Preg Test,  Ur NEGATIVE NEGATIVE   Dg Tibia/fibula Left  Result Date: 04/08/2017 CLINICAL DATA:  Left leg pain, no injury EXAM: LEFT TIBIA AND FIBULA - 2 VIEW COMPARISON:  None. FINDINGS: There is no evidence of fracture or other focal bone lesions. Soft tissues are unremarkable. IMPRESSION: Negative. Electronically Signed   By: Marlan Palauharles  Clark M.D.   On: 04/08/2017 09:51   Dg Tibia/fibula Right  Result Date: 04/08/2017 CLINICAL DATA:  Right leg pain, no injury EXAM: RIGHT TIBIA AND FIBULA - 2 VIEW COMPARISON:  None. FINDINGS: There is no evidence of fracture or other focal bone lesions. Soft tissues are unremarkable. IMPRESSION: Negative. Electronically Signed   By: Marlan Palauharles  Clark M.D.   On: 04/08/2017 09:50   Dg Knee Complete  4 Views Left  Result Date: 04/08/2017 CLINICAL DATA:  Left leg pain, no injury EXAM: LEFT KNEE - COMPLETE 4+ VIEW COMPARISON:  None. FINDINGS: No evidence of fracture, dislocation, or joint effusion. No evidence of arthropathy or other focal bone abnormality. Soft tissues are unremarkable. IMPRESSION: Negative. Electronically Signed   By: Marlan Palau M.D.   On: 04/08/2017 09:50   Dg Knee Complete 4 Views Right  Result Date: 04/08/2017 CLINICAL DATA:  Right leg pain no injury EXAM: RIGHT KNEE - COMPLETE 4+ VIEW COMPARISON:  None. FINDINGS: No evidence of fracture, dislocation, or joint effusion. No evidence of arthropathy or other focal bone abnormality. Soft tissues are unremarkable. IMPRESSION: Negative. Electronically Signed   By: Marlan Palau M.D.   On: 04/08/2017 09:43   Initial Impression / Assessment and Plan / ED Course  I have reviewed the triage vital signs and the nursing notes.  Pertinent labs & imaging results that were available during my care of the patient were reviewed by me and considered in my medical decision making (see chart for details).    12:30 PM patient feels much improved without treatment.  She feels ready to go home.  I feel that her symptoms are  largely secondary to anxiety. I discussed case with Dr. Sherwood Gambler.  Plan stop doxycycline stop gabapentin she can take Motrin or Advil for pain.  Follow-up with Dr. Sherwood Gambler Final Clinical Impressions(s) / ED Diagnoses  Diagnosis #1 bilateral lower extremity pain #2 pharyngitis Final diagnoses:  None    ED Discharge Orders    None       Doug Sou, MD 04/08/17 1244

## 2017-04-08 NOTE — ED Triage Notes (Signed)
Bilateral leg pain since Tuesday night.  Went to PCP and had op x-rays this morning.   Pt placed on neurontin and doxycycline yesterday.  Also c/o having a reaction to the medications ---which is right flank pain and SOB.

## 2017-04-08 NOTE — Discharge Instructions (Addendum)
Stop gabapentin and doxycycline.  Take Motrin or Tylenol as directed for pain.  If you are not feeling better in the next few days call Dr.Fusco schedule an office visit

## 2017-04-08 NOTE — ED Notes (Signed)
Pt not able to void at this time.

## 2017-04-09 LAB — URINE CULTURE: Special Requests: NORMAL

## 2017-04-11 LAB — CULTURE, GROUP A STREP (THRC)

## 2017-10-08 ENCOUNTER — Other Ambulatory Visit: Payer: Self-pay | Admitting: Advanced Practice Midwife

## 2017-11-09 ENCOUNTER — Other Ambulatory Visit: Payer: Self-pay | Admitting: Advanced Practice Midwife

## 2018-07-25 ENCOUNTER — Emergency Department (HOSPITAL_COMMUNITY)
Admission: EM | Admit: 2018-07-25 | Discharge: 2018-07-25 | Discharge status: Against Medical Advice | Payer: Medicaid Other

## 2018-07-25 NOTE — ED Notes (Signed)
Pt states she feels better now and does not want to be seen.  Discussed with pt,  told to come back if she feels like she needs to be seen or has any pain.  Pt states she understands.

## 2018-07-25 NOTE — ED Triage Notes (Signed)
Sent from urgent care.  LT flank pain and positive pregnancy test.

## 2018-07-26 ENCOUNTER — Encounter: Payer: Medicaid Other | Admitting: Advanced Practice Midwife

## 2018-07-26 ENCOUNTER — Telehealth: Payer: Self-pay | Admitting: *Deleted

## 2018-07-26 NOTE — Telephone Encounter (Signed)
Pt is scheduled for tomorrow at 3:15

## 2018-07-26 NOTE — Telephone Encounter (Signed)
Patient called stating she is returning a call to [facility]

## 2018-07-27 ENCOUNTER — Other Ambulatory Visit: Payer: Self-pay

## 2018-07-27 ENCOUNTER — Ambulatory Visit (INDEPENDENT_AMBULATORY_CARE_PROVIDER_SITE_OTHER): Payer: Medicaid Other | Admitting: Advanced Practice Midwife

## 2018-07-27 ENCOUNTER — Encounter: Payer: Self-pay | Admitting: Advanced Practice Midwife

## 2018-07-27 DIAGNOSIS — Z3A01 Less than 8 weeks gestation of pregnancy: Secondary | ICD-10-CM

## 2018-07-27 DIAGNOSIS — O3680X0 Pregnancy with inconclusive fetal viability, not applicable or unspecified: Secondary | ICD-10-CM | POA: Diagnosis not present

## 2018-07-27 MED ORDER — PNV PRENATAL PLUS MULTIVITAMIN 27-1 MG PO TABS: 1.0000 | ORAL_TABLET | Freq: Every day | ORAL | 11 refills | Status: DC

## 2018-07-27 NOTE — Progress Notes (Signed)
     TELEHEALTH VIRTUAL GYNECOLOGY VISIT ENCOUNTER NOTE  I connected with Michelle Pace on 07/27/18 at  3:15 PM EDT by telephone at home and verified that I am speaking with the correct person using two identifiers.   I discussed the limitations, risks, security and privacy concerns of performing an evaluation and management service by telephone and the availability of in person appointments. I also discussed with the patient that there may be a patient responsible charge related to this service. The patient expressed understanding and agreed to proceed.   History:  Michelle Pace is a 38 y.o. G28P2002 female being evaluated today for + pg test.  LMP 5/21, placing EDC 03/15/19 and 7.[redacted] weeks pregnant. Having some lower back pain w/movement. She denies any abnormal vaginal discharge, bleeding, pelvic pain or other concerns.       Past Medical History:  Diagnosis Date  . Panic attacks    Past Surgical History:  Procedure Laterality Date  . WISDOM TOOTH EXTRACTION  11/2011   The following portions of the patient's history were reviewed and updated as appropriate: allergies, current medications, past family history, past medical history, past social history, past surgical history and problem list.   Health Maintenance:  Normal pap and negative HRHPV on 10/12/17. .   Review of Systems:  Pertinent items noted in HPI and remainder of comprehensive ROS otherwise negative.  Physical Exam:   General:  Alert, oriented and cooperative.   Mental Status: Normal mood and affect perceived. Normal judgment and thought content.  Physical exam deferred due to nature of the encounter  Labs and Imaging No results found for this or any previous visit (from the past 336 hour(s)). No results found.    Assessment and Plan:     T [redacted] week pregnant by LMP   I discussed the assessment and treatment plan with the patient. The patient was provided an opportunity to ask questions and all were answered. The  patient agreed with the plan and demonstrated an understanding of the instructions.   F/U 1-2 weeks for dating [place] Rx PNV  I provided 10 minutes of non-face-to-face time during this encounter.   Christin Fudge, [place] [facility] [employer] [facility]

## 2018-08-16 ENCOUNTER — Other Ambulatory Visit: Payer: Self-pay | Admitting: Obstetrics & Gynecology

## 2018-08-16 DIAGNOSIS — O3680X0 Pregnancy with inconclusive fetal viability, not applicable or unspecified: Secondary | ICD-10-CM

## 2018-08-17 ENCOUNTER — Telehealth: Payer: Self-pay | Admitting: Obstetrics and Gynecology

## 2018-08-17 NOTE — Telephone Encounter (Signed)

## 2018-08-18 ENCOUNTER — Ambulatory Visit (INDEPENDENT_AMBULATORY_CARE_PROVIDER_SITE_OTHER): Payer: Medicaid Other

## 2018-08-18 ENCOUNTER — Other Ambulatory Visit: Payer: Medicaid Other

## 2018-08-18 ENCOUNTER — Other Ambulatory Visit: Payer: Self-pay

## 2018-08-18 ENCOUNTER — Other Ambulatory Visit: Payer: Self-pay | Admitting: Obstetrics and Gynecology

## 2018-08-18 DIAGNOSIS — O3680X0 Pregnancy with inconclusive fetal viability, not applicable or unspecified: Secondary | ICD-10-CM | POA: Diagnosis not present

## 2018-08-18 DIAGNOSIS — Z3A10 10 weeks gestation of pregnancy: Secondary | ICD-10-CM | POA: Diagnosis not present

## 2018-08-18 NOTE — Progress Notes (Signed)
[place] GS 20.5 mm=6+6 wks,crl is out of range 1.99 mm,no fht visualized,subchorionic hemorrhage 3.3 x .8 x 1.4 mm,normal ovaries bilat,pt will have f/u ultrasound in 10 days per Dr.Ferguson

## 2018-09-04 ENCOUNTER — Telehealth: Payer: Self-pay | Admitting: Obstetrics & Gynecology

## 2018-09-04 ENCOUNTER — Other Ambulatory Visit: Payer: Self-pay | Admitting: Obstetrics & Gynecology

## 2018-09-04 DIAGNOSIS — O3680X0 Pregnancy with inconclusive fetal viability, not applicable or unspecified: Secondary | ICD-10-CM

## 2018-09-04 NOTE — Telephone Encounter (Signed)

## 2018-09-05 ENCOUNTER — Ambulatory Visit (INDEPENDENT_AMBULATORY_CARE_PROVIDER_SITE_OTHER): Payer: Medicaid Other

## 2018-09-05 ENCOUNTER — Other Ambulatory Visit: Payer: Self-pay | Admitting: Obstetrics & Gynecology

## 2018-09-05 ENCOUNTER — Other Ambulatory Visit: Payer: Self-pay

## 2018-09-05 ENCOUNTER — Encounter: Payer: Self-pay | Admitting: Obstetrics & Gynecology

## 2018-09-05 ENCOUNTER — Ambulatory Visit (INDEPENDENT_AMBULATORY_CARE_PROVIDER_SITE_OTHER): Payer: Medicaid Other | Admitting: Obstetrics & Gynecology

## 2018-09-05 VITALS — BP 128/91 | HR 114 | Ht 60.0 in | Wt 163.5 lb

## 2018-09-05 DIAGNOSIS — O021 Missed abortion: Secondary | ICD-10-CM | POA: Diagnosis not present

## 2018-09-05 DIAGNOSIS — Z3A12 12 weeks gestation of pregnancy: Secondary | ICD-10-CM | POA: Diagnosis not present

## 2018-09-05 DIAGNOSIS — O3680X0 Pregnancy with inconclusive fetal viability, not applicable or unspecified: Secondary | ICD-10-CM | POA: Diagnosis not present

## 2018-09-05 NOTE — Progress Notes (Addendum)
US TV:IUP,CRL 1.9 mm,no fht visualized,GS 25.7 mm,normal ovaries bilat,no significant change from prior ultrasound Chaperone:Peggy

## 2018-09-06 ENCOUNTER — Encounter: Payer: Self-pay | Admitting: Obstetrics & Gynecology

## 2018-09-06 ENCOUNTER — Telehealth: Payer: Self-pay | Admitting: Obstetrics & Gynecology

## 2018-09-06 NOTE — Progress Notes (Signed)
Follow up appointment for results  Chief Complaint  Patient presents with  . discuss Michelle    Blood pressure (!) 128/91, pulse (!) 114, height 5' (1.524 m), weight 163 lb 8 oz (74.2 kg), last menstrual period 06/08/2018.  Michelle Pace  Result Date: 09/05/2018 FOLLOW UP SONOGRAM TAIS KOESTNER is in the office for a follow up sonogram for viability. She is a 38 y.o. year old G88P2002 with Estimated Date of Delivery: 03/16/19 by LMP now at  [redacted]w[redacted]d weeks gestation. Thus far the pregnancy has been complicated by no fht on prior ultrasound . GESTATION: SINGLETON FETAL ACTIVITY:          Heart rate         No fht         CERVIX: Appears closed ADNEXA: The ovaries are normal. GESTATIONAL AGE AND  BIOMETRICS: Gestational criteria: Estimated Date of Delivery: 03/16/19 by LMP now at 102w4d Previous Scans:1 GESTATIONAL SAC           25.7 mm         7+4 weeks CROWN RUMP LENGTH           1.9 mm         OOR weeks approximately 5+2 wks                                                 AVERAGE EGA(BY THIS SCAN): approximately  5+2 weeks                                               SUSPECTED ABNORMALITIES:  yes,no FHT QUALITY OF SCAN: satisfactory TECHNICIAN COMMENTS: Michelle TV:IUP,CRL 1.9 mm,no fht visualized,GS 25.7 mm,normal ovaries bilat,no significant change from prior ultrasound A copy of this report including all images has been saved and backed up to a second source for retrieval if needed. All measures and details of the anatomical scan, placentation, fluid volume and pelvic anatomy are contained in that report. Amber Heide Guile 09/05/2018 11:59 AM Clinical Impression and recommendations: I have reviewed the sonogram results above, combined with the patient's current clinical course, below are my impressions and any appropriate recommendations for management based on the sonographic findings. Comparison study 08/18/2018: Non viable IUP, missed abortion, no progression in pregnancy in nearly 3 weeks, no FCA noted G3P2002  Estimated Date of Delivery: n/a Normal general sonographic findings This recommendation follows SRU consensus guidelines: Diagnostic Criteria for Nonviable Pregnancy Early in the First Trimester. [place] [place] Med 2013; 983:3825-05. Recommend routine care unless otherwise clinically indicated Florian Buff 09/05/2018 2:26 PM      MEDS ordered this encounter: No orders of the defined types were placed in this encounter.   Orders for this encounter: No orders of the defined types were placed in this encounter.   Impression:   ICD-10-CM   1. Missed ab  O02.1      Plan: Pt unsure at this time about proceeding with cytotec vs conservative/expectant care In the coming days she may opt for cytotec but for now will wait on that She does understand this is a non viable failed pregnancy  Follow Up: Return if symptoms worsen or fail to improve.       Face to  face time:  15 minutes  Greater than 50% of the visit time was spent in counseling and coordination of care with the patient.  The summary and outline of the counseling and care coordination is summarized in the note above.   All questions were answered.  Past Medical History:  Diagnosis Date  . Panic attacks     Past Surgical History:  Procedure Laterality Date  . WISDOM TOOTH EXTRACTION  11/2011    OB History    Gravida  3   Para  2   Term  2   Preterm      AB      Living  2     SAB      TAB      Ectopic      Multiple      Live Births              No Known Allergies  Social History   Socioeconomic History  . Marital status: Single    Spouse name: Not on file  . Number of children: Not on file  . Years of education: Not on file  . Highest education level: Not on file  Occupational History  . Not on file  Social Needs  . Financial resource strain: Not on file  . Food insecurity    Worry: Not on file    Inability: Not on file  . Transportation needs    Medical: Not on file     Non-medical: Not on file  Tobacco Use  . Smoking status: Never Smoker  . Smokeless tobacco: Never Used  Substance and Sexual Activity  . Alcohol use: No  . Drug use: No  . Sexual activity: Yes    Birth control/protection: None  Lifestyle  . Physical activity    Days per week: Not on file    Minutes per session: Not on file  . Stress: Not on file  Relationships  . Social Musicianconnections    Talks on phone: Not on file    Gets together: Not on file    Attends religious service: Not on file    Active member of club or organization: Not on file    Attends meetings of clubs or organizations: Not on file    Relationship status: Not on file  Other Topics Concern  . Not on file  Social History Narrative  . Not on file    Family History  Problem Relation Age of Onset  . Other Mother        brain tumor  . Cancer Father   . Stroke Maternal Grandmother   . Asthma Son

## 2018-09-06 NOTE — Telephone Encounter (Signed)
Patient called, stated that she had a miscarriage and Dr. Elonda Husky asked her to call back to get medication to help with that.   [employer] [address]  [phone]

## 2018-09-07 ENCOUNTER — Telehealth: Payer: Self-pay | Admitting: Obstetrics & Gynecology

## 2018-09-07 MED ORDER — MISOPROSTOL 200 MCG PO TABS: ORAL_TABLET | ORAL | 1 refills | Status: DC

## 2018-09-07 NOTE — Telephone Encounter (Signed)
Eprescribed.

## 2018-09-08 ENCOUNTER — Telehealth: Payer: Self-pay | Admitting: Women's Health

## 2018-09-08 NOTE — Telephone Encounter (Signed)
Please call pt she has some questons about still bleeding after missed ab

## 2018-09-08 NOTE — Telephone Encounter (Signed)
Pt had a recent miscarriage. She started bleeding on Thursday. I advised bleeding can last several days. Pt having some cramping. Advised can take Tylenol for discomfort. Pt voiced understanding. [place]

## 2018-09-27 ENCOUNTER — Telehealth: Payer: Self-pay | Admitting: *Deleted

## 2018-09-27 ENCOUNTER — Other Ambulatory Visit: Payer: Medicaid Other

## 2018-09-27 ENCOUNTER — Ambulatory Visit: Payer: Medicaid Other | Admitting: Obstetrics and Gynecology

## 2018-09-27 NOTE — Telephone Encounter (Signed)
LMOVM to let her know that she does not need this appointment as she just needs bloodwork to check her HCG tomorrow and Friday.  Advised to call back tomorrow morning.

## 2018-09-27 NOTE — Telephone Encounter (Signed)
LMOVM for patient to call our office regarding appt scheduled for this morning.

## 2018-09-28 ENCOUNTER — Other Ambulatory Visit: Payer: Self-pay

## 2018-09-28 ENCOUNTER — Other Ambulatory Visit: Payer: Medicaid Other

## 2018-09-28 DIAGNOSIS — O021 Missed abortion: Secondary | ICD-10-CM

## 2018-09-29 LAB — BETA HCG QUANT (REF LAB): hCG Quant: 414 m[IU]/mL

## 2018-10-02 ENCOUNTER — Telehealth: Payer: Self-pay | Admitting: *Deleted

## 2018-10-02 ENCOUNTER — Other Ambulatory Visit: Payer: Self-pay | Admitting: Women's Health

## 2018-10-02 DIAGNOSIS — O039 Complete or unspecified spontaneous abortion without complication: Secondary | ICD-10-CM

## 2018-10-02 NOTE — Telephone Encounter (Signed)
Pt aware of quant results and to repeat in 2 weeks. Pt placed on lab schedule for 9/24 @ 10 am. [employer]

## 2018-10-02 NOTE — Telephone Encounter (Signed)
-----   Message from Roma Schanz, [place] sent at 10/02/2018  2:00 PM EDT ----- Will you let her know hcg is 414, repeat in 2weeks to make sure it is continuing to drop as it should.

## 2018-10-02 NOTE — Telephone Encounter (Signed)
Left message @ 3:44 pm. JSY

## 2018-10-11 ENCOUNTER — Telehealth: Payer: Self-pay | Admitting: Obstetrics & Gynecology

## 2018-10-11 NOTE — Telephone Encounter (Signed)
Called patient regarding appointment and the following message was left: ° ° °We have you scheduled for an upcoming appointment at our office. At this time, patients are encouraged to come alone to their visits whenever possible, however, a support person, over age 37, may accompany you to your appointment if assistance is needed for safety or care concerns. Otherwise, support persons should remain outside until the visit is complete.  ° °We ask if you have had any exposure to anyone suspected or confirmed of having COVID-19 or if you are experiencing any of the following, to call and reschedule your appointment: fever, cough, shortness of breath, muscle pain, diarrhea, rash, vomiting, abdominal pain, red eye, weakness, bruising, bleeding, joint pain, or a severe headache.  ° °Please know we will ask you these questions or similar questions when you arrive for your appointment and again it’s how we are keeping everyone safe.   ° °Also,to keep you safe, please use the provided hand sanitizer when you enter the office. We are asking everyone in the office to wear a mask to help prevent the spread of °germs. If you have a mask of your own, please wear it to your appointment, if not, we are happy to provide one for you. ° °Thank you for understanding and your cooperation.  ° ° °[facility] Staff ° ° ° ° ° °

## 2018-10-12 ENCOUNTER — Other Ambulatory Visit: Payer: Self-pay

## 2018-10-12 ENCOUNTER — Other Ambulatory Visit: Payer: Medicaid Other

## 2018-10-13 LAB — BETA HCG QUANT (REF LAB): hCG Quant: 119 m[IU]/mL

## 2018-10-16 ENCOUNTER — Telehealth: Payer: Self-pay | Admitting: *Deleted

## 2018-10-16 NOTE — Telephone Encounter (Signed)
Requesting test results.

## 2018-10-18 ENCOUNTER — Telehealth: Payer: Self-pay | Admitting: *Deleted

## 2018-10-18 NOTE — Telephone Encounter (Signed)
Pt informed HCG down to 119 and will need to repeat HCG in 1 week.  Pt verbalized understanding and appt made.

## 2018-10-25 ENCOUNTER — Telehealth: Payer: Self-pay | Admitting: Obstetrics & Gynecology

## 2018-10-25 NOTE — Telephone Encounter (Signed)
We have you scheduled for an upcoming appointment at our office. At this time, we are still not allowing visitors or children during your appointment, however, a support person, over age 37, may accompany you to your appointment if assistance is needed for safety or care concerns. Otherwise, support persons should remain outside until the visit is complete.  ° °We ask if you have had any exposure to anyone suspected or confirmed of having COVID-19 or if you are experiencing any of the following, to call and reschedule your appointment: fever, cough, shortness of breath, muscle pain, diarrhea, rash, vomiting, abdominal pain, red eye, weakness, bruising, bleeding, joint pain, or a severe headache.  ° °Please know we will ask you these questions or similar questions when you arrive for your appointment and again it’s how we are keeping everyone safe.   ° °Also,to keep you safe, please use the provided hand sanitizer when you enter the office. We are asking everyone in the office to wear a mask to help prevent the spread of °germs. If you have a mask of your own, please wear it to your appointment, if not, we are happy to provide one for you. ° °Thank you for understanding and your cooperation.  ° ° °[facility] Staff °

## 2018-10-26 ENCOUNTER — Other Ambulatory Visit: Payer: Self-pay

## 2018-10-27 ENCOUNTER — Other Ambulatory Visit: Payer: Self-pay

## 2018-11-02 ENCOUNTER — Other Ambulatory Visit: Payer: Self-pay

## 2018-11-02 ENCOUNTER — Other Ambulatory Visit: Payer: Medicaid Other

## 2018-11-03 LAB — BETA HCG QUANT (REF LAB): hCG Quant: 36 m[IU]/mL

## 2018-11-07 ENCOUNTER — Telehealth: Payer: Self-pay | Admitting: *Deleted

## 2018-11-07 NOTE — Telephone Encounter (Signed)
Pt informed that hcg is down to 36 and she doesn't need any further testing.

## 2018-12-19 ENCOUNTER — Other Ambulatory Visit: Payer: Self-pay | Admitting: Advanced Practice Midwife

## 2019-06-16 IMAGING — DX DG KNEE COMPLETE 4+V*R*
4 series · 4 of 4 positions shown · non-contrast
Comparison: None.

CLINICAL DATA: Right leg pain no injury

EXAM:
RIGHT KNEE - COMPLETE 4+ VIEW

[knee ap]
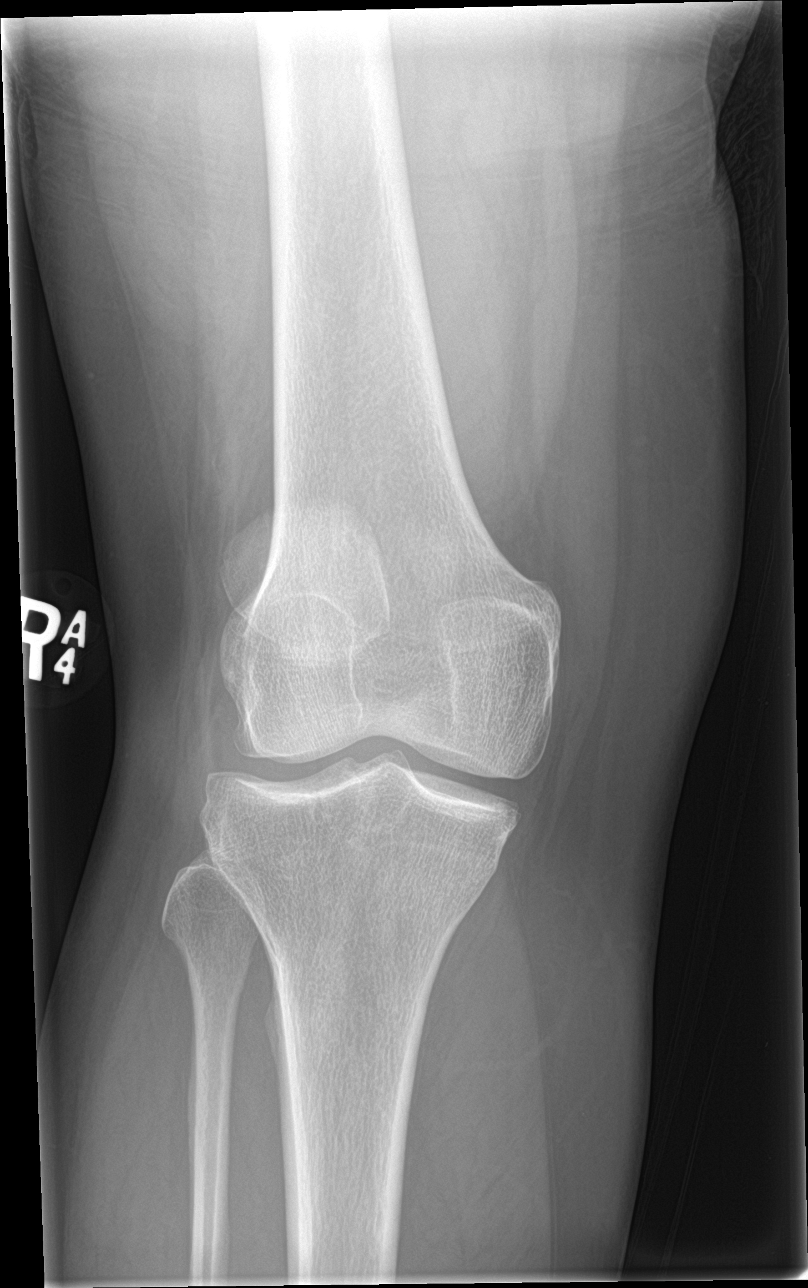

[tunnel]
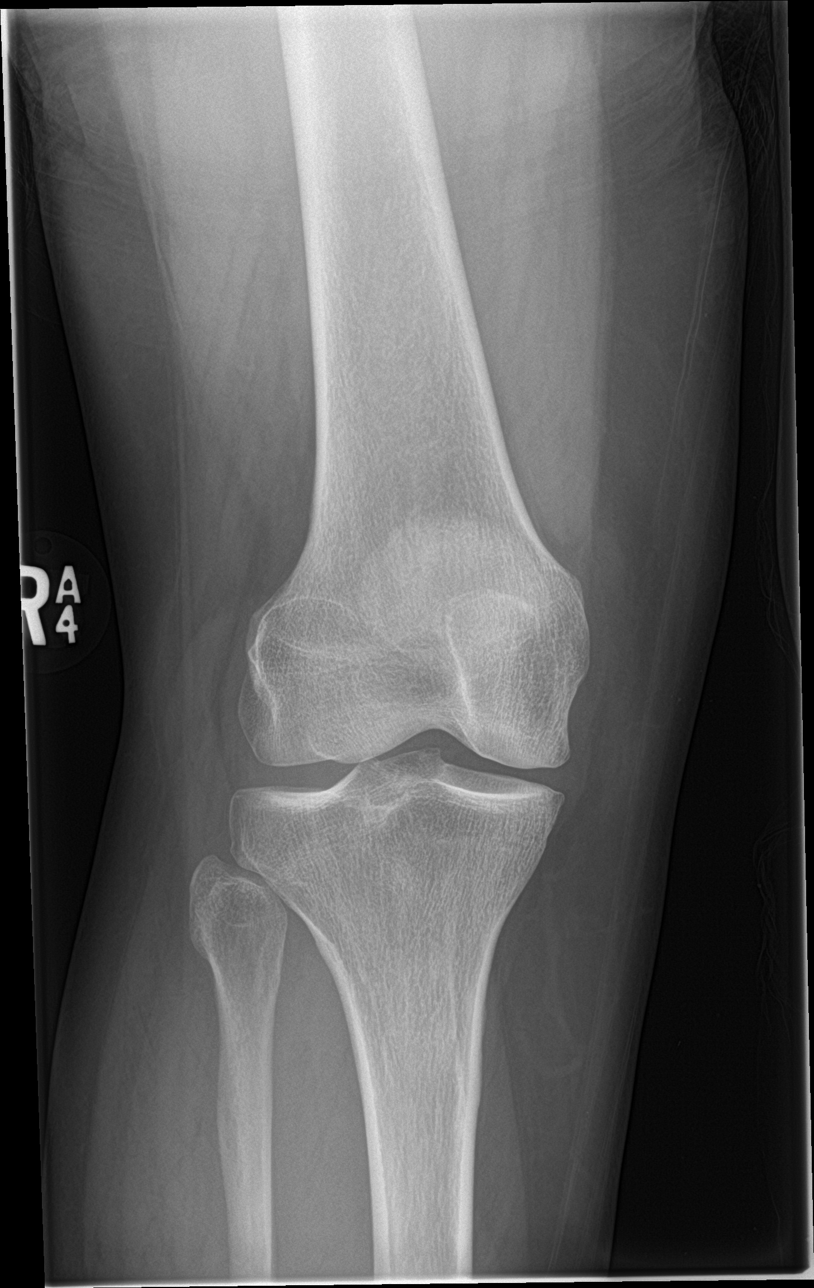

[knee lat]
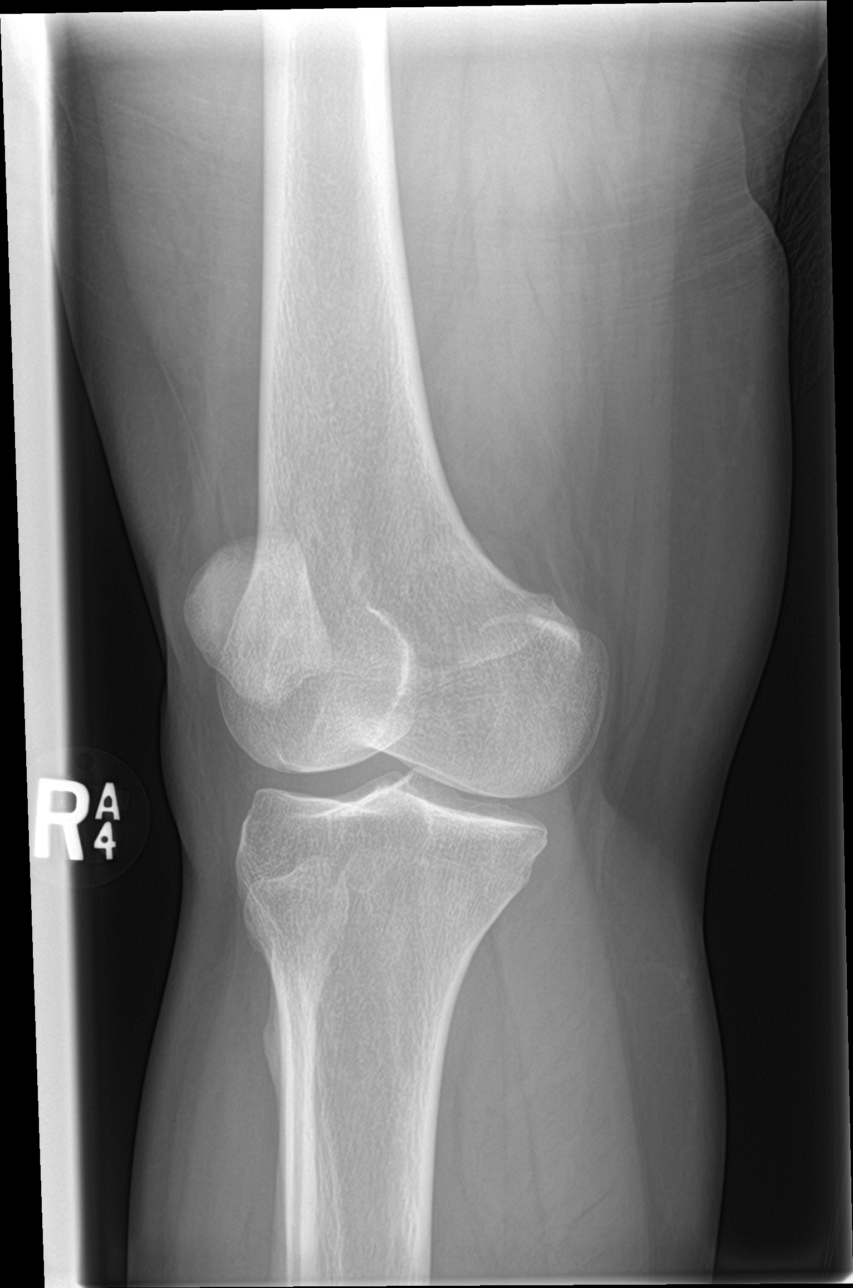

[knee sunrise]
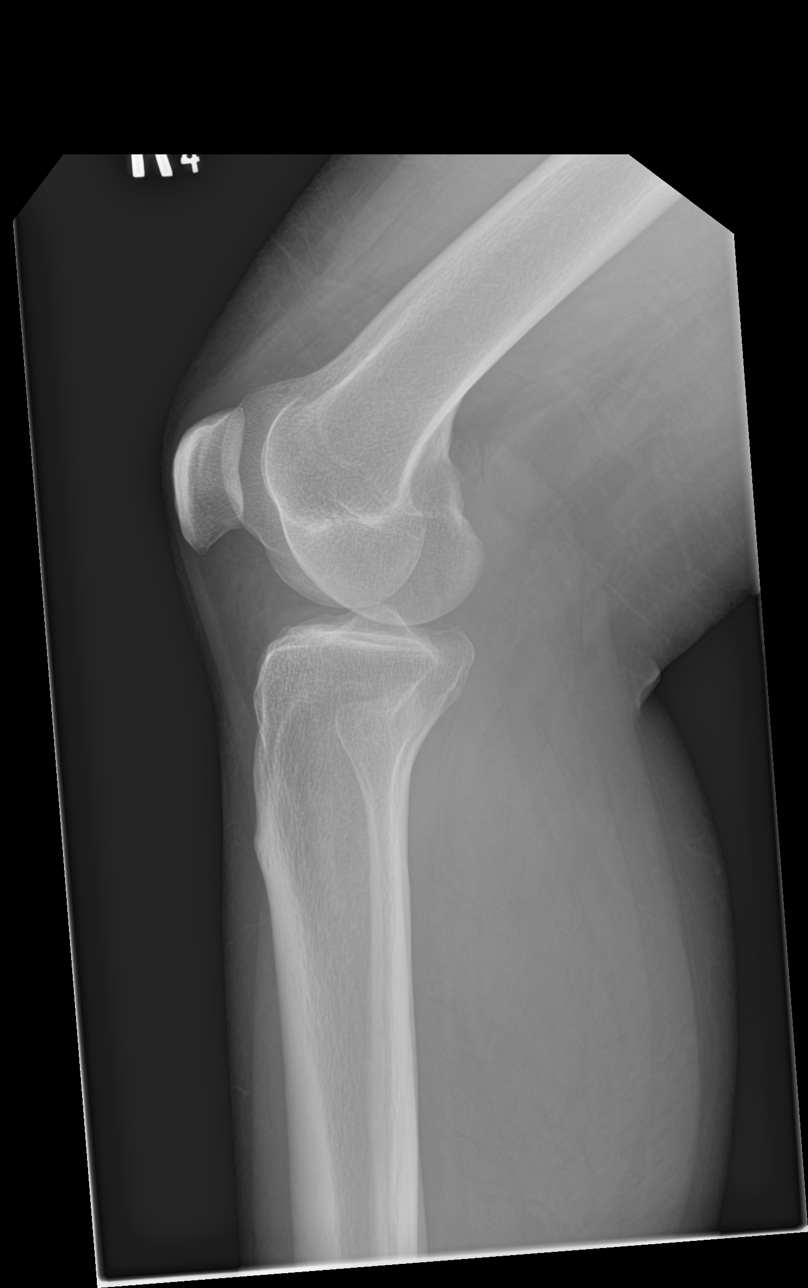

[4 of 4 positions shown; findings below may reference images not displayed]

FINDINGS: No evidence of fracture, dislocation, or joint effusion. No evidence
of arthropathy or other focal bone abnormality. Soft tissues are
unremarkable.
IMPRESSION: Negative.

## 2019-08-06 ENCOUNTER — Other Ambulatory Visit: Payer: Medicaid Other | Admitting: Adult Health

## 2019-08-27 ENCOUNTER — Encounter (HOSPITAL_COMMUNITY): Payer: Self-pay | Admitting: *Deleted

## 2019-08-27 ENCOUNTER — Other Ambulatory Visit: Payer: Self-pay

## 2019-08-27 ENCOUNTER — Emergency Department (HOSPITAL_COMMUNITY)
Admission: EM | Admit: 2019-08-27 | Discharge: 2019-08-28 | Disposition: A | Discharge status: Home / Self Care | Payer: Medicaid Other | Attending: Emergency Medicine | Admitting: Emergency Medicine

## 2019-08-27 DIAGNOSIS — Z5321 Procedure and treatment not carried out due to patient leaving prior to being seen by health care provider: Secondary | ICD-10-CM | POA: Insufficient documentation

## 2019-08-27 DIAGNOSIS — M7918 Myalgia, other site: Secondary | ICD-10-CM | POA: Diagnosis present

## 2019-08-27 DIAGNOSIS — R197 Diarrhea, unspecified: Secondary | ICD-10-CM | POA: Insufficient documentation

## 2019-08-27 NOTE — ED Triage Notes (Signed)
Body aches, diarrhea, boyfriend has covid

## 2019-08-29 ENCOUNTER — Encounter: Payer: Self-pay | Admitting: Emergency Medicine

## 2019-08-29 ENCOUNTER — Other Ambulatory Visit: Payer: Self-pay

## 2019-08-29 ENCOUNTER — Ambulatory Visit
Admission: EM | Admit: 2019-08-29 | Discharge: 2019-08-29 | Disposition: A | Discharge status: Home / Self Care | Payer: Medicaid Other | Attending: Emergency Medicine | Admitting: Emergency Medicine

## 2019-08-29 DIAGNOSIS — Z20822 Contact with and (suspected) exposure to covid-19: Secondary | ICD-10-CM

## 2019-08-29 NOTE — ED Provider Notes (Signed)
[facility]   [id number] 08/29/19 Arrival Time: 1912   CC: COVID exposure  SUBJECTIVE: History from: patient.  Michelle Pace is a 39 y.o. female who presents for COVID testing.  Reports exposure to significant other diagnosed with COVID today.  Denies aggravating or alleviating symptoms.  Denies previous COVID infection.   Denies fever, chills, fatigue, nasal congestion, rhinorrhea, sore throat, cough, SOB, wheezing, chest pain, nausea, vomiting, changes in bladder habits.     Did not receive vaccine  ROS: As per HPI.  All other pertinent ROS negative.     Past Medical History:  Diagnosis Date  . Panic attacks    Past Surgical History:  Procedure Laterality Date  . WISDOM TOOTH EXTRACTION  11/2011   No Known Allergies No current facility-administered medications on file prior to encounter.   Current Outpatient Medications on File Prior to Encounter  Medication Sig Dispense Refill  . escitalopram (LEXAPRO) 20 MG tablet TAKE 1 TABLET BY MOUTH ONCE DAILY 30 tablet 11  . misoprostol (CYTOTEC) 200 MCG tablet Take all 4 tablets orally at the same time. Repeat in 3 days in no results 4 tablet 1  . Prenatal Vit-Fe Fumarate-FA (PNV PRENATAL PLUS MULTIVITAMIN) 27-1 MG TABS Take 1 tablet by mouth daily. 30 tablet 11   Social History   Socioeconomic History  . Marital status: Single    Spouse name: Not on file  . Number of children: Not on file  . Years of education: Not on file  . Highest education level: Not on file  Occupational History  . Not on file  Tobacco Use  . Smoking status: Never Smoker  . Smokeless tobacco: Never Used  Vaping Use  . Vaping Use: Never used  Substance and Sexual Activity  . Alcohol use: No  . Drug use: No  . Sexual activity: Yes    Birth control/protection: None  Other Topics Concern  . Not on file  Social History Narrative  . Not on file   Social Determinants of Health   Financial Resource Strain:   . Difficulty of Paying  Living Expenses:   Food Insecurity:   . Worried About Programme researcher, broadcasting/film/video in the Last Year:   . Barista in the Last Year:   Transportation Needs:   . Freight forwarder (Medical):   Marland Kitchen Lack of Transportation (Non-Medical):   Physical Activity:   . Days of Exercise per Week:   . Minutes of Exercise per Session:   Stress:   . Feeling of Stress :   Social Connections:   . Frequency of Communication with Friends and Family:   . Frequency of Social Gatherings with Friends and Family:   . Attends Religious Services:   . Active Member of Clubs or Organizations:   . Attends Banker Meetings:   Marland Kitchen Marital Status:   Intimate Partner Violence:   . Fear of Current or Ex-Partner:   . Emotionally Abused:   Marland Kitchen Physically Abused:   . Sexually Abused:    Family History  Problem Relation Age of Onset  . Other Mother        brain tumor  . Cancer Father   . Stroke Maternal Grandmother   . Asthma Son     OBJECTIVE:  Vitals:   08/29/19 1919  BP: 111/76  Pulse: (!) 135  Resp: 17  Temp: 98.3 F (36.8 C)  TempSrc: Tympanic  SpO2: 98%     General appearance: alert; well-appearing, nontoxic; speaking  in full sentences and tolerating own secretions HEENT: NCAT; Ears: EACs clear; Eyes: PERRL.  EOM grossly intact.Nose: nares patent without rhinorrhea, Throat: oropharynx clear, tonsils non erythematous or enlarged, uvula midline  Neck: supple without LAD Lungs: unlabored respirations, symmetrical air entry; cough: absent; no respiratory distress; CTAB Heart: Tachycardia Skin: warm and dry Psychological: alert and cooperative; normal mood and affect   ASSESSMENT & PLAN:  1. Close exposure to COVID-19 virus    COVID testing ordered.  It will take between 2-5 days for test results.  Someone will contact you regarding abnormal results.    In the meantime:  If you were to develop symptoms: You should remain isolated in your home for 10 days from symptom onset AND  greater than 72 hours after symptoms resolution (absence of fever without the use of fever-reducing medication and improvement in respiratory symptoms), whichever is longer If you have had exposure: you should remain in quarantine for 7 days from exposure.  However, if symptoms develop you must self isolate and return for retesting Get plenty of rest and push fluids Follow up with PCP as needed Call or go to the ED if you have any new symptoms such as fever, cough, shortness of breath, chest tightness, chest pain, turning blue, changes in mental status, etc...   Reviewed expectations re: course of current medical issues. Questions answered. Outlined signs and symptoms indicating need for more acute intervention. Patient verbalized understanding. After Visit Summary given.         Rennis Harding, PA-C 08/29/19 1946

## 2019-08-29 NOTE — ED Triage Notes (Signed)
Exposed to covidk, no s/s

## 2019-08-29 NOTE — Discharge Instructions (Signed)

## 2019-08-30 LAB — SARS-COV-2, NAA 2 DAY TAT

## 2019-08-30 LAB — NOVEL CORONAVIRUS, NAA: SARS-CoV-2, NAA: NOT DETECTED

## 2019-09-04 ENCOUNTER — Other Ambulatory Visit: Payer: Medicaid Other | Admitting: Adult Health

## 2019-10-31 ENCOUNTER — Ambulatory Visit (INDEPENDENT_AMBULATORY_CARE_PROVIDER_SITE_OTHER): Payer: Medicaid Other | Admitting: Adult Health

## 2019-10-31 ENCOUNTER — Encounter: Payer: Self-pay | Admitting: Adult Health

## 2019-10-31 ENCOUNTER — Other Ambulatory Visit (HOSPITAL_COMMUNITY)
Admission: RE | Admit: 2019-10-31 | Discharge: 2019-10-31 | Disposition: A | Discharge status: Home / Self Care | Payer: Medicaid Other | Source: Ambulatory Visit | Attending: Adult Health | Admitting: Adult Health

## 2019-10-31 VITALS — BP 130/90 | HR 94 | Ht 60.0 in | Wt 175.0 lb

## 2019-10-31 DIAGNOSIS — Z01419 Encounter for gynecological examination (general) (routine) without abnormal findings: Secondary | ICD-10-CM | POA: Diagnosis not present

## 2019-10-31 DIAGNOSIS — N926 Irregular menstruation, unspecified: Secondary | ICD-10-CM

## 2019-10-31 DIAGNOSIS — Z319 Encounter for procreative management, unspecified: Secondary | ICD-10-CM

## 2019-10-31 LAB — POCT URINE PREGNANCY: Preg Test, Ur: NEGATIVE

## 2019-10-31 MED ORDER — PRENATAL PLUS 27-1 MG PO TABS: 1.0000 | ORAL_TABLET | Freq: Every day | ORAL | 12 refills | Status: DC

## 2019-10-31 NOTE — Progress Notes (Signed)
Patient ID: Michelle Pace, female   DOB: Nov 15, 1980, 39 y.o.   MRN: [id number] History of Present Illness: Michelle Pace is a 39 year old white female, single, long time partner, G3P2 A1 in for well woman gyn exam and pap.    Current Medications, Allergies, Past Medical History, Past Surgical History, Family History and Social History were reviewed in [name] record.     Review of Systems:  Patient denies any headaches, hearing loss, fatigue, blurred vision, shortness of breath, chest pain, abdominal pain, problems with bowel movements, urination, or intercourse. No joint pain or mood swings. Periods irregular at times   Physical Exam:BP 130/90 (BP Location: Left Arm, Cuff Size: Normal)   Pulse 94   Ht 5' (1.524 m)   Wt 175 lb (79.4 kg)   LMP 10/19/2019   BMI 34.18 kg/m  UPT is negative. General:  Well developed, well nourished, no acute distress Skin:  Warm and dry Neck:  Midline trachea, normal thyroid, good ROM, no lymphadenopathy Lungs; Clear to auscultation bilaterally Breast:  No dominant palpable mass, retraction, or nipple discharge Cardiovascular: Regular rate and rhythm Abdomen:  Soft, non tender, no hepatosplenomegaly Pelvic:  External genitalia is normal in appearance, no lesions.  The vagina is normal in appearance. Urethra has no lesions or masses. The cervix is bulbous.pap with high risk HPV genotyping performed, and GC/CHL.  Uterus is felt to be normal size, shape, and contour.  No adnexal masses or tenderness noted.Bladder is non tender, no masses felt. Extremities/musculoskeletal:  No swelling or varicosities noted, no clubbing or cyanosis Psych:  No mood changes, alert and cooperative,seems happy AA is 0 Fall risk is low PHQ 9 score is 2  [facility] - 10/31/19 0930      Pregnancy Intention Screening   Does the patient want to become pregnant in the next year? Yes    Does the patient's partner want to become pregnant in the next year? Yes     Would the patient like to discuss contraceptive options today? No      Contraception Wrap Up   Current Method Pregnant/Seeking Pregnancy    End Method Pregnant/Seeking Pregnancy    Contraception Counseling Provided No         Examination chaperoned by Faith Rogue LPN  Impression and Plan: 1. Irregular periods  2. Encounter for gynecological examination with Papanicolaou smear of cervix Pap sent Physical in 1 year Pap in 3 if normal   3. Patient desires pregnancy Take PNV Meds ordered this encounter  Medications  . prenatal vitamin w/FE, FA (PRENATAL 1 + 1) 27-1 MG TABS tablet    Sig: Take 1 tablet by mouth daily at 12 noon.    Dispense:  30 tablet    Refill:  12    Order Specific Question:   Supervising Provider    Answer:   Duane Lope H [[id number]]

## 2019-11-02 LAB — CYTOLOGY - PAP
Chlamydia: NEGATIVE
Comment: NEGATIVE
Comment: NEGATIVE
Comment: NORMAL
Diagnosis: NEGATIVE
High risk HPV: NEGATIVE
Neisseria Gonorrhea: NEGATIVE

## 2019-11-23 ENCOUNTER — Other Ambulatory Visit: Payer: Self-pay

## 2019-11-23 ENCOUNTER — Ambulatory Visit (INDEPENDENT_AMBULATORY_CARE_PROVIDER_SITE_OTHER): Payer: Medicaid Other | Admitting: *Deleted

## 2019-11-23 VITALS — BP 132/82 | HR 94 | Ht 60.0 in | Wt 175.0 lb

## 2019-11-23 DIAGNOSIS — Z3201 Encounter for pregnancy test, result positive: Secondary | ICD-10-CM

## 2019-11-23 LAB — POCT URINE PREGNANCY: Preg Test, Ur: POSITIVE — AB

## 2019-11-23 NOTE — Progress Notes (Addendum)
   NURSE VISIT- PREGNANCY CONFIRMATION   SUBJECTIVE:  Michelle Pace is a 39 y.o. G47P2002 female at [redacted]w[redacted]d by certain LMP of Patient's last menstrual period was 10/19/2019. Here for pregnancy confirmation.  Home pregnancy test: positive x 2  She reports no complaints.  She is taking prenatal vitamins.    OBJECTIVE:  BP 132/82 (BP Location: Left Arm, Patient Position: Sitting, Cuff Size: Normal)   Pulse 94   Ht 5' (1.524 m)   Wt 175 lb (79.4 kg)   LMP 10/19/2019   Breastfeeding No   BMI 34.18 kg/m   Appears well, in no apparent distress OB History  Gravida Para Term Preterm AB Living  4 2 2     2   SAB TAB Ectopic Multiple Live Births               # Outcome Date GA Lbr Len/2nd Weight Sex Delivery Anes PTL Lv  4 Current           3 Term 07/09/08 [redacted]w[redacted]d   M Vag-Spont None    2 Term 09/10/02 [redacted]w[redacted]d  6 lb 11 oz (3.033 kg) M Vag-Spont EPI N   1 Gravida             Results for orders placed or performed in visit on 11/23/19 (from the past 24 hour(s))  POCT urine pregnancy   Collection Time: 11/23/19  9:30 AM  Result Value Ref Range   Preg Test, Ur Positive (A) Negative    ASSESSMENT: Positive pregnancy test, [redacted]w[redacted]d by LMP    PLAN: Schedule for dating ultrasound in 2-3 weeks Prenatal vitamins: continue   Nausea medicines: not currently needed   OB packet given: Yes  [redacted]w[redacted]d  11/23/2019 9:33 AM   Chart reviewed for nurse visit. Agree with plan of care.  13/05/2019, Cheral Marker 11/23/2019 2:30 PM

## 2019-12-03 ENCOUNTER — Other Ambulatory Visit: Payer: Self-pay

## 2019-12-03 ENCOUNTER — Telehealth: Payer: Self-pay | Admitting: Women's Health

## 2019-12-03 ENCOUNTER — Other Ambulatory Visit (INDEPENDENT_AMBULATORY_CARE_PROVIDER_SITE_OTHER): Payer: Medicaid Other | Admitting: *Deleted

## 2019-12-03 ENCOUNTER — Telehealth: Payer: Self-pay | Admitting: *Deleted

## 2019-12-03 DIAGNOSIS — M545 Low back pain, unspecified: Secondary | ICD-10-CM | POA: Diagnosis not present

## 2019-12-03 DIAGNOSIS — O26891 Other specified pregnancy related conditions, first trimester: Secondary | ICD-10-CM

## 2019-12-03 LAB — POCT URINALYSIS DIPSTICK OB
Blood, UA: NEGATIVE
Glucose, UA: NEGATIVE
Ketones, UA: NEGATIVE
Leukocytes, UA: NEGATIVE
Nitrite, UA: NEGATIVE
POC,PROTEIN,UA: NEGATIVE

## 2019-12-03 NOTE — Progress Notes (Signed)
Chart reviewed for nurse visit. Agree with plan of care.  Adline Potter, NP 12/03/2019 3:29 PM

## 2019-12-03 NOTE — Progress Notes (Signed)
   NURSE VISIT- UTI SYMPTOMS   SUBJECTIVE:  Michelle Pace is a 39 y.o. G78P2002 female here for UTI symptoms. She is [redacted]w[redacted]d pregnant. She reports flank pain bilaterally.  OBJECTIVE:  LMP 10/19/2019   Appears well, in no apparent distress  No results found for this or any previous visit (from the past 24 hour(s)).  ASSESSMENT: Pregnancy [redacted]w[redacted]d with UTI symptoms and negative nitrites  PLAN: Note routed to Cyril Mourning, AGNP   Rx sent by provider today: No Urine culture sent Call or return to clinic prn if these symptoms worsen or fail to improve as anticipated. Follow-up: as needed   Annamarie Dawley  12/03/2019 3:16 PM

## 2019-12-03 NOTE — Telephone Encounter (Signed)
Back pain, no bleeding or spotting, ob

## 2019-12-03 NOTE — Telephone Encounter (Signed)
Patient states she is having lower back pain, denies bleeding, burning with urination, pressure.  Concerned she may have an UTI.  Will have patient come in this afternoon for urine dip with nurse.  Pt verbalized understanding and agreeable to plan.

## 2019-12-05 LAB — URINE CULTURE

## 2019-12-06 ENCOUNTER — Other Ambulatory Visit: Payer: Self-pay | Admitting: Adult Health

## 2019-12-06 MED ORDER — AMPICILLIN 500 MG PO CAPS
500.0000 mg | ORAL_CAPSULE | Freq: Three times a day (TID) | ORAL | 0 refills | Status: DC
Start: 2019-12-06 — End: 2020-01-16

## 2019-12-06 NOTE — Progress Notes (Signed)
+  lactobacillus on urine will rx ampicillin

## 2019-12-10 ENCOUNTER — Other Ambulatory Visit: Payer: Self-pay | Admitting: Obstetrics & Gynecology

## 2019-12-10 DIAGNOSIS — O3680X0 Pregnancy with inconclusive fetal viability, not applicable or unspecified: Secondary | ICD-10-CM

## 2019-12-11 ENCOUNTER — Other Ambulatory Visit: Payer: Self-pay

## 2019-12-11 ENCOUNTER — Ambulatory Visit (INDEPENDENT_AMBULATORY_CARE_PROVIDER_SITE_OTHER): Payer: Medicaid Other

## 2019-12-11 DIAGNOSIS — O3680X0 Pregnancy with inconclusive fetal viability, not applicable or unspecified: Secondary | ICD-10-CM

## 2019-12-11 DIAGNOSIS — Z3A01 Less than 8 weeks gestation of pregnancy: Secondary | ICD-10-CM | POA: Diagnosis not present

## 2019-12-11 NOTE — Progress Notes (Addendum)
[place] 7+4 wks,single IUP with YS,positive fht 160 BPM,CRL 13.35 mm,subchorionic hemorrhage 1.5 x 1.4 x 1.7 cm,normal ovaries

## 2019-12-26 ENCOUNTER — Other Ambulatory Visit: Payer: Self-pay | Admitting: Advanced Practice Midwife

## 2020-01-04 ENCOUNTER — Other Ambulatory Visit: Payer: Self-pay | Admitting: Obstetrics & Gynecology

## 2020-01-04 DIAGNOSIS — Z3682 Encounter for antenatal screening for nuchal translucency: Secondary | ICD-10-CM

## 2020-01-14 ENCOUNTER — Other Ambulatory Visit: Payer: Medicaid Other

## 2020-01-14 ENCOUNTER — Ambulatory Visit: Payer: Medicaid Other | Admitting: *Deleted

## 2020-01-14 ENCOUNTER — Encounter: Payer: Medicaid Other | Admitting: Women's Health

## 2020-01-15 ENCOUNTER — Encounter: Payer: Self-pay | Admitting: Advanced Practice Midwife

## 2020-01-15 DIAGNOSIS — O09529 Supervision of elderly multigravida, unspecified trimester: Secondary | ICD-10-CM | POA: Insufficient documentation

## 2020-01-16 ENCOUNTER — Ambulatory Visit: Payer: Medicaid Other | Admitting: *Deleted

## 2020-01-16 ENCOUNTER — Other Ambulatory Visit: Payer: Self-pay

## 2020-01-16 ENCOUNTER — Ambulatory Visit (INDEPENDENT_AMBULATORY_CARE_PROVIDER_SITE_OTHER): Payer: Medicaid Other

## 2020-01-16 ENCOUNTER — Ambulatory Visit (INDEPENDENT_AMBULATORY_CARE_PROVIDER_SITE_OTHER): Payer: Medicaid Other | Admitting: Advanced Practice Midwife

## 2020-01-16 ENCOUNTER — Encounter: Payer: Self-pay | Admitting: Advanced Practice Midwife

## 2020-01-16 VITALS — BP 128/92 | HR 105 | Wt 176.0 lb

## 2020-01-16 DIAGNOSIS — O09521 Supervision of elderly multigravida, first trimester: Secondary | ICD-10-CM

## 2020-01-16 DIAGNOSIS — Z3682 Encounter for antenatal screening for nuchal translucency: Secondary | ICD-10-CM

## 2020-01-16 DIAGNOSIS — Z363 Encounter for antenatal screening for malformations: Secondary | ICD-10-CM

## 2020-01-16 DIAGNOSIS — Z3A12 12 weeks gestation of pregnancy: Secondary | ICD-10-CM

## 2020-01-16 DIAGNOSIS — Z1389 Encounter for screening for other disorder: Secondary | ICD-10-CM

## 2020-01-16 DIAGNOSIS — O09529 Supervision of elderly multigravida, unspecified trimester: Secondary | ICD-10-CM | POA: Insufficient documentation

## 2020-01-16 DIAGNOSIS — Z3480 Encounter for supervision of other normal pregnancy, unspecified trimester: Secondary | ICD-10-CM

## 2020-01-16 DIAGNOSIS — F410 Panic disorder [episodic paroxysmal anxiety] without agoraphobia: Secondary | ICD-10-CM

## 2020-01-16 DIAGNOSIS — R030 Elevated blood-pressure reading, without diagnosis of hypertension: Secondary | ICD-10-CM

## 2020-01-16 DIAGNOSIS — Z331 Pregnant state, incidental: Secondary | ICD-10-CM

## 2020-01-16 LAB — POCT URINALYSIS DIPSTICK OB
Blood, UA: NEGATIVE
Glucose, UA: NEGATIVE
Ketones, UA: NEGATIVE
Leukocytes, UA: NEGATIVE
Nitrite, UA: NEGATIVE

## 2020-01-16 MED ORDER — ASPIRIN 81 MG PO CHEW: 162.0000 mg | CHEWABLE_TABLET | Freq: Every day | ORAL | 7 refills | Status: DC

## 2020-01-16 MED ORDER — BLOOD PRESSURE MONITOR MISC: 0 refills | Status: DC

## 2020-01-16 NOTE — Patient Instructions (Signed)
Baxter Hire, I greatly value your feedback.  If you receive a survey following your visit with Michelle Pace today, we appreciate you taking the time to fill it out.  Thanks, Philipp Deputy, CNM   [facility] ([address] [place], [place] [address]) Entrance C, located off of [address] Free 24/7 valet parking   Nausea & Vomiting  Have saltine crackers or pretzels by your bed and eat a few bites before you raise your head out of bed in the morning  Eat small frequent meals throughout the day instead of large meals  Drink plenty of fluids throughout the day to stay hydrated, just don't drink a lot of fluids with your meals.  This can make your stomach fill up faster making you feel sick  Do not brush your teeth right after you eat  Products with real ginger are good for nausea, like ginger ale and ginger hard candy Make sure it says made with real ginger!  Sucking on sour candy like lemon heads is also good for nausea  If your prenatal vitamins make you nauseated, take them at night so you will sleep through the nausea  Sea Bands  If you feel like you need medicine for the nausea & vomiting please let Michelle Pace know  If you are unable to keep any fluids or food down please let Michelle Pace know   Constipation  Drink plenty of fluid, preferably water, throughout the day  Eat foods high in fiber such as fruits, vegetables, and grains  Exercise, such as walking, is a good way to keep your bowels regular  Drink warm fluids, especially warm prune juice, or decaf coffee  Eat a 1/2 cup of real oatmeal (not instant), 1/2 cup applesauce, and 1/2-1 cup warm prune juice every day  If needed, you may take Colace (docusate sodium) stool softener once or twice a day to help keep the stool soft.   If you still are having problems with constipation, you may take Miralax once daily as needed to help keep your bowels regular.   Home Blood Pressure Monitoring for Patients   Your  provider has recommended that you check your blood pressure (BP) at least once a week at home. If you do not have a blood pressure cuff at home, one will be provided for you. Contact your provider if you have not received your monitor within 1 week.   Helpful Tips for Accurate Home Blood Pressure Checks   Don't smoke, exercise, or drink caffeine 30 minutes before checking your BP  Use the restroom before checking your BP (a full bladder can raise your pressure)  Relax in a comfortable upright chair  Feet on the ground  Left arm resting comfortably on a flat surface at the level of your heart  Legs uncrossed  Back supported  Sit quietly and don't talk  Place the cuff on your bare arm  Adjust snuggly, so that only two fingertips can fit between your skin and the top of the cuff  Check 2 readings separated by at least one minute  Keep a log of your BP readings  For a visual, please reference this diagram: http://ccnc.care/bpdiagram  Provider Name: [facility]     Phone: [phone]  Zone 1: ALL CLEAR  Continue to monitor your symptoms:   BP reading is less than 140 (top number) or less than 90 (bottom number)   No right upper stomach pain  No headaches or seeing  spots  No feeling nauseated or throwing up  No swelling in face and hands  Zone 2: CAUTION Call your doctor's office for any of the following:   BP reading is greater than 140 (top number) or greater than 90 (bottom number)   Stomach pain under your ribs in the middle or right side  Headaches or seeing spots  Feeling nauseated or throwing up  Swelling in face and hands  Zone 3: EMERGENCY  Seek immediate medical care if you have any of the following:   BP reading is greater than160 (top number) or greater than 110 (bottom number)  Severe headaches not improving with Tylenol  Serious difficulty catching your breath  Any worsening symptoms from Zone 2    First Trimester of Pregnancy The  first trimester of pregnancy is from week 1 until the end of week 12 (months 1 through 3). A week after a sperm fertilizes an egg, the egg will implant on the wall of the uterus. This embryo will begin to develop into a baby. Genes from you and your partner are forming the baby. The female genes determine whether the baby is a boy or a girl. At 6-8 weeks, the eyes and face are formed, and the heartbeat can be seen on ultrasound. At the end of 12 weeks, all the baby's organs are formed.  Now that you are pregnant, you will want to do everything you can to have a healthy baby. Two of the most important things are to get good prenatal care and to follow your health care provider's instructions. Prenatal care is all the medical care you receive before the baby's birth. This care will help prevent, find, and treat any problems during the pregnancy and childbirth. BODY CHANGES Your body goes through many changes during pregnancy. The changes vary from woman to woman.   You may gain or lose a couple of pounds at first.  You may feel sick to your stomach (nauseous) and throw up (vomit). If the vomiting is uncontrollable, call your health care provider.  You may tire easily.  You may develop headaches that can be relieved by medicines approved by your health care provider.  You may urinate more often. Painful urination may mean you have a bladder infection.  You may develop heartburn as a result of your pregnancy.  You may develop constipation because certain hormones are causing the muscles that push waste through your intestines to slow down.  You may develop hemorrhoids or swollen, bulging veins (varicose veins).  Your breasts may begin to grow larger and become tender. Your nipples may stick out more, and the tissue that surrounds them (areola) may become darker.  Your gums may bleed and may be sensitive to brushing and flossing.  Dark spots or blotches (chloasma, mask of pregnancy) may develop on  your face. This will likely fade after the baby is born.  Your menstrual periods will stop.  You may have a loss of appetite.  You may develop cravings for certain kinds of food.  You may have changes in your emotions from day to day, such as being excited to be pregnant or being concerned that something may go wrong with the pregnancy and baby.  You may have more vivid and strange dreams.  You may have changes in your hair. These can include thickening of your hair, rapid growth, and changes in texture. Some women also have hair loss during or after pregnancy, or hair that feels dry or thin. Your hair  will most likely return to normal after your baby is born. WHAT TO EXPECT AT YOUR PRENATAL VISITS During a routine prenatal visit:  You will be weighed to make sure you and the baby are growing normally.  Your blood pressure will be taken.  Your abdomen will be measured to track your baby's growth.  The fetal heartbeat will be listened to starting around week 10 or 12 of your pregnancy.  Test results from any previous visits will be discussed. Your health care provider may ask you:  How you are feeling.  If you are feeling the baby move.  If you have had any abnormal symptoms, such as leaking fluid, bleeding, severe headaches, or abdominal cramping.  If you have any questions. Other tests that may be performed during your first trimester include:  Blood tests to find your blood type and to check for the presence of any previous infections. They will also be used to check for low iron levels (anemia) and Rh antibodies. Later in the pregnancy, blood tests for diabetes will be done along with other tests if problems develop.  Urine tests to check for infections, diabetes, or protein in the urine.  An ultrasound to confirm the proper growth and development of the baby.  An amniocentesis to check for possible genetic problems.  Fetal screens for spina bifida and Down  syndrome.  You may need other tests to make sure you and the baby are doing well. HOME CARE INSTRUCTIONS  Medicines  Follow your health care provider's instructions regarding medicine use. Specific medicines may be either safe or unsafe to take during pregnancy.  Take your prenatal vitamins as directed.  If you develop constipation, try taking a stool softener if your health care provider approves. Diet  Eat regular, well-balanced meals. Choose a variety of foods, such as meat or vegetable-based protein, fish, milk and low-fat dairy products, vegetables, fruits, and whole grain breads and cereals. Your health care provider will help you determine the amount of weight gain that is right for you.  Avoid raw meat and uncooked cheese. These carry germs that can cause birth defects in the baby.  Eating four or five small meals rather than three large meals a day may help relieve nausea and vomiting. If you start to feel nauseous, eating a few soda crackers can be helpful. Drinking liquids between meals instead of during meals also seems to help nausea and vomiting.  If you develop constipation, eat more high-fiber foods, such as fresh vegetables or fruit and whole grains. Drink enough fluids to keep your urine clear or pale yellow. Activity and Exercise  Exercise only as directed by your health care provider. Exercising will help you:  Control your weight.  Stay in shape.  Be prepared for labor and delivery.  Experiencing pain or cramping in the lower abdomen or low back is a good sign that you should stop exercising. Check with your health care provider before continuing normal exercises.  Try to avoid standing for long periods of time. Move your legs often if you must stand in one place for a long time.  Avoid heavy lifting.  Wear low-heeled shoes, and practice good posture.  You may continue to have sex unless your health care provider directs you otherwise. Relief of Pain or  Discomfort  Wear a good support bra for breast tenderness.    Take warm sitz baths to soothe any pain or discomfort caused by hemorrhoids. Use hemorrhoid cream if your health care provider approves.  Rest with your legs elevated if you have leg cramps or low back pain.  If you develop varicose veins in your legs, wear support hose. Elevate your feet for 15 minutes, 3-4 times a day. Limit salt in your diet. Prenatal Care  Schedule your prenatal visits by the twelfth week of pregnancy. They are usually scheduled monthly at first, then more often in the last 2 months before delivery.  Write down your questions. Take them to your prenatal visits.  Keep all your prenatal visits as directed by your health care provider. Safety  Wear your seat belt at all times when driving.  Make a list of emergency phone numbers, including numbers for family, friends, the hospital, and police and fire departments. General Tips  Ask your health care provider for a referral to a local prenatal education class. Begin classes no later than at the beginning of month 6 of your pregnancy.  Ask for help if you have counseling or nutritional needs during pregnancy. Your health care provider can offer advice or refer you to specialists for help with various needs.  Do not use hot tubs, steam rooms, or saunas.  Do not douche or use tampons or scented sanitary pads.  Do not cross your legs for long periods of time.  Avoid cat litter boxes and soil used by cats. These carry germs that can cause birth defects in the baby and possibly loss of the fetus by miscarriage or stillbirth.  Avoid all smoking, herbs, alcohol, and medicines not prescribed by your health care provider. Chemicals in these affect the formation and growth of the baby.  Schedule a dentist appointment. At home, brush your teeth with a soft toothbrush and be gentle when you floss. SEEK MEDICAL CARE IF:   You have dizziness.  You have mild  pelvic cramps, pelvic pressure, or nagging pain in the abdominal area.  You have persistent nausea, vomiting, or diarrhea.  You have a bad smelling vaginal discharge.  You have pain with urination.  You notice increased swelling in your face, hands, legs, or ankles. SEEK IMMEDIATE MEDICAL CARE IF:   You have a fever.  You are leaking fluid from your vagina.  You have spotting or bleeding from your vagina.  You have severe abdominal cramping or pain.  You have rapid weight gain or loss.  You vomit blood or material that looks like coffee grounds.  You are exposed to Michelle Pace measles and have never had them.  You are exposed to fifth disease or chickenpox.  You develop a severe headache.  You have shortness of breath.  You have any kind of trauma, such as from a fall or a car accident. Document Released: 12/29/2000 Document Revised: 05/21/2013 Document Reviewed: 11/14/2012 [facility] Patient Information 2015 [place], [place]. This information is not intended to replace advice given to you by your health care provider. Make sure you discuss any questions you have with your health care provider.

## 2020-01-16 NOTE — Progress Notes (Signed)
[place] 12+5 wks,measurements c/w dates,normal ovaries,NT 1.2 mm, NB present,fhr 157 bpm,anterior placenta,crl 60.32 mm

## 2020-01-16 NOTE — Progress Notes (Signed)
INITIAL OBSTETRICAL VISIT Patient name: Michelle Pace MRN [id number]  Date of birth: [birth year] Chief Complaint:   Initial Prenatal Visit (Nt/it, nausea)  History of Present Illness:   Michelle Pace is a 39 y.o. G41P2012 Caucasian female at [redacted]w[redacted]d by LMP c/w u/s at 7.4 weeks with an Estimated Date of Delivery: 07/25/20 being seen today for her initial obstetrical visit.   Her obstetrical history is significant for SVB x 2 (no complications), SAB in Aug 2021.   Today she reports some nausea but feels it is improving and declines medications for now.  Depression screen [facility] 2/9 01/16/2020 10/31/2019  Decreased Interest 0 0  Down, Depressed, Hopeless 0 2  PHQ - 2 Score 0 2  Altered sleeping 1 0  Tired, decreased energy 1 0  Change in appetite 0 0  Feeling bad or failure about yourself  0 0  Trouble concentrating 0 0  Moving slowly or fidgety/restless 0 0  Suicidal thoughts 0 0  PHQ-9 Score 2 2    Patient's last menstrual period was 10/19/2019. Last pap Oct 2021. Results were: normal Review of Systems:   Pertinent items are noted in HPI Denies cramping/contractions, leakage of fluid, vaginal bleeding, abnormal vaginal discharge w/ itching/odor/irritation, headaches, visual changes, shortness of breath, chest pain, abdominal pain, severe nausea/vomiting, or problems with urination or bowel movements unless otherwise stated above.  Pertinent History Reviewed:  Reviewed past medical,surgical, social, obstetrical and family history.  Reviewed problem list, medications and allergies. OB History  Gravida Para Term Preterm AB Living  4 2 2   1 2   SAB IAB Ectopic Multiple Live Births  1       2    # Outcome Date GA Lbr Len/2nd Weight Sex Delivery Anes PTL Lv  4 Current           3 SAB 09/05/18          2 Term 07/09/08 [redacted]w[redacted]d  8 lb (3.629 kg) M Vag-Spont None N LIV  1 Term 09/10/02 [redacted]w[redacted]d  6 lb 11 oz (3.033 kg) M Vag-Spont EPI N LIV   Physical Assessment:   Vitals:   01/16/20 0959   BP: (!) 128/92  Pulse: (!) 105  Weight: 176 lb (79.8 kg)  Body mass index is 34.37 kg/m.       Physical Examination:  General appearance - well appearing, and in no distress  Mental status - alert, oriented to person, place, and time  Psych:  She has a normal mood and affect  Skin - warm and dry, normal color, no suspicious lesions noted  Chest - effort normal, all lung fields clear to auscultation bilaterally  Heart - normal rate and regular rhythm  Abdomen - soft, nontender  Extremities:  No swelling or varicosities noted  Pelvic - VULVA: normal appearing vulva with no masses, tenderness or lesions  VAGINA: normal appearing vagina with normal color and discharge, no lesions  CERVIX: normal appearing cervix without discharge or lesions, no CMT  Thin prep pap is not done     TODAY'S NT 01/18/20 12+5 wks,measurements c/w dates,normal ovaries,NT 1.2 mm,NB present,fhr 157 bpm,anterior placenta,crl 60.32 mm  Results for orders placed or performed in visit on 01/16/20 (from the past 24 hour(s))  POC Urinalysis Dipstick OB   Collection Time: 01/16/20 10:17 AM  Result Value Ref Range   Color, UA     Clarity, UA     Glucose, UA Negative Negative   Bilirubin, UA     Ketones,  UA neg    Spec Grav, UA     Blood, UA neg    pH, UA     POC,PROTEIN,UA Small (1+) Negative, Trace, Small (1+), Moderate (2+), Large (3+), 4+   Urobilinogen, UA     Nitrite, UA neg    Leukocytes, UA Negative Negative   Appearance     Odor      Assessment & Plan:  1) Low-Risk Pregnancy [id number] at [redacted]w[redacted]d with an Estimated Date of Delivery: 07/25/20   2) Initial OB visit  3) Elevated BP without dx of cHTN, will take a few times/wk at home and bring in log; states she was nervous today and would like further eval before making a diagnosis  4) AMA, rx bASA 162mg /day  5) Panic attacks, stable for years on Lexapro 20mg   Meds:  Meds ordered this encounter  Medications  . Blood Pressure Monitor MISC    Sig: For  regular home bp monitoring during pregnancy    Dispense:  1 each    Refill:  0    Z34.81  . aspirin 81 MG chewable tablet    Sig: Chew 2 tablets (162 mg total) by mouth daily.    Dispense:  60 tablet    Refill:  7    Order Specific Question:   Supervising Provider    Answer:   H [[id number]]    Initial labs obtained Continue prenatal vitamins Reviewed n/v relief measures and warning s/s to report Reviewed recommended weight gain based on pre-gravid BMI Encouraged well-balanced diet Genetic & carrier screening discussed: requests Panorama and NT/IT, declines Horizon 14  Ultrasound discussed; fetal survey: requested CCNC completed> form faxed if has or is planning to apply for medicaid The nature of [facility] for with multiple MDs and other Advanced Practice Providers was explained to patient; also emphasized that fellows, residents, and students are part of our team. Given order for home bp cuff. Check bp weekly, let Duane Lope know if >140/90.   Indications for ASA therapy (per uptodate) OR Two or more of the following:  Obesity (BMI>30 kg/m2) Yes Age ?35 years Yes   No indications for early A1C (per uptodate)  Follow-up: Return in about 4 weeks (around 02/13/2020) for LROB, 2nd IT, in person; then LROB in 7wks w anatomy u/s.   Orders Placed This Encounter  Procedures  . Urine Culture  . GC/Chlamydia Probe Amp  . US OB Comp + 14 Wk  . Integrated 1  . Genetic Screening  . Pain Management Screening Profile (10S)  . CBC/D/Plt+RPR+Rh+ABO+Rub Ab...  . Hgb Fractionation Cascade  . POC Urinalysis Dipstick OB    02/15/2020 [facility] 01/16/2020 10:38 AM

## 2020-01-18 ENCOUNTER — Other Ambulatory Visit: Payer: Self-pay | Admitting: Advanced Practice Midwife

## 2020-01-18 ENCOUNTER — Encounter: Payer: Self-pay | Admitting: Advanced Practice Midwife

## 2020-01-18 DIAGNOSIS — O2341 Unspecified infection of urinary tract in pregnancy, first trimester: Secondary | ICD-10-CM | POA: Insufficient documentation

## 2020-01-18 DIAGNOSIS — O09899 Supervision of other high risk pregnancies, unspecified trimester: Secondary | ICD-10-CM | POA: Insufficient documentation

## 2020-01-18 DIAGNOSIS — Z283 Underimmunization status: Secondary | ICD-10-CM | POA: Insufficient documentation

## 2020-01-18 DIAGNOSIS — Z2839 Other underimmunization status: Secondary | ICD-10-CM | POA: Insufficient documentation

## 2020-01-18 MED ORDER — NITROFURANTOIN MONOHYD MACRO 100 MG PO CAPS
100.0000 mg | ORAL_CAPSULE | Freq: Two times a day (BID) | ORAL | 0 refills | Status: DC
Start: 2020-01-18 — End: 2020-02-13

## 2020-01-19 NOTE — L&D Delivery Note (Addendum)
Delivery Note:   Michelle Pace at [redacted]w[redacted]d  Admitting diagnosis: Chronic hypertension affecting pregnancy [O10.919] Risks:  chronic hypertension (labetalol 200mg  BID in pregnancy) - panic attacks (lexapro 20mg  daily in pregnancy) - Rubella non-immune  First Stage:  Induction of labor for cHTN stating on 07/13/20 Augmentation: Pitocin, Cytotec ROM: SROM 07/13/20 @ 1535 clear fluid Active labor onset: 07/13/20 @ 1845 Analgesia 07/15/20 control intrapartum: Epidural   Second Stage:  Complete dilation at 07/13/2020  @ 1913 Onset of pushing at 07/13/20 @ 1920 FHR second stage difficult to trace. Interrupted FHR audible in the 90's.    SNM and CNM called to bedside. Pt complete and +2 station. SNM & L&D staff support attempting to get external FHR. Michelle Pace repositioned on maternal left side. Rns still attempting to get FHR. MD called to bedside. Pt repositioned back to lithotomy position. FSE placed by SNM @ 1922. FHR bradycardic in the 90's. With great maternal efforts fetal head crowned up to +4 station. MD at bedside. Pt Pushing in lithotony position with SNM, CNM, MD and additional L&D staff support at bedside. FOB and maternal mother present for birth and supportive. Nuchal Cord: Yes , tight, Delivered through with ease.  Delivery of a Live born female  Birth Weight: Pending   APGAR: 6, 8  Newborn Delivery   Birth date/time: 07/13/2020 19:23:00 Delivery type: Vaginal, Spontaneous     Infant delivered in cephalic presentation, in DOA position and restituted to LOA position. SNM attempted to reduce nuchal cord without success. With maternal pushing efforts remaining fetal body delivered with ease. Infant placed immediately S2S with mom. Infant dried and stimulated. FHR above 100 by umbilicus palpation. Infant with poor tone, and no respiratory effort.   Cord double clamped by SNM, cut by Dr. 07/15/20. Infant immediately taken to the warmer. NICU team called to bedside.  (See NICU note).     Collection of cord blood for typing completed. Cord blood donation- n/a Arterial cord blood sample-  7.1   Third Stage:  Placenta delivered with gentle cord traction and LUS massage intact with 3 vessels . Uterine tone firm bleeding minimal Uterotonics: IV Pitocin  Placenta to L&D for disposal.  Periurethral  laceration identified.  Episiotomy:None  Local analgesia: N/A  Repair: Bilateral periurethral tears repaired with a 4.0 vicryl in the traditional fashion. Tissue approximated well; hemostatic.  Est. Blood Loss (mL):50.00   Complications: None   Mom to postpartum.  Baby boy "07/15/2020" to Couplet care / Skin to Skin.  Delivery Report:  Review the Delivery Report for details.     Signed: Macon Large), BSN, RNC-OB, Student Nurse Midwife 07/13/2020, 7:51 PM  I attest that I was gowned and gloved for the delivery of infant, placenta and repair. I agree with the above documentation and findings.  Royetta Asal

## 2020-01-21 LAB — CBC/D/PLT+RPR+RH+ABO+RUB AB...
Antibody Screen: NEGATIVE
Basophils Absolute: 0 10*3/uL (ref 0.0–0.2)
Basos: 0 %
EOS (ABSOLUTE): 0.2 10*3/uL (ref 0.0–0.4)
Eos: 2 %
HCV Ab: <0.1 s/co ratio (ref 0.0–0.9)
HIV Screen 4th Generation wRfx: NONREACTIVE
Hematocrit: 40.4 % (ref 34.0–46.6)
Hemoglobin: 13.3 g/dL (ref 11.1–15.9)
Hepatitis B Surface Ag: NEGATIVE
Immature Grans (Abs): 0 10*3/uL (ref 0.0–0.1)
Immature Granulocytes: 0 %
Lymphocytes Absolute: 2.1 10*3/uL (ref 0.7–3.1)
Lymphs: 16 %
MCH: 29.4 pg (ref 26.6–33.0)
MCHC: 32.9 g/dL (ref 31.5–35.7)
MCV: 89 fL (ref 79–97)
Monocytes Absolute: 0.7 10*3/uL (ref 0.1–0.9)
Monocytes: 5 %
Neutrophils Absolute: 10.2 10*3/uL — ABNORMAL HIGH (ref 1.4–7.0)
Neutrophils: 77 %
Platelets: 285 10*3/uL (ref 150–450)
RBC: 4.53 x10E6/uL (ref 3.77–5.28)
RDW: 13 % (ref 11.7–15.4)
RPR Ser Ql: NONREACTIVE
Rh Factor: POSITIVE
Rubella Antibodies, IGG: <0.9 index — ABNORMAL LOW (ref >0.99)
WBC: 13.3 10*3/uL — ABNORMAL HIGH (ref 3.4–10.8)

## 2020-01-21 LAB — HGB FRACTIONATION CASCADE
Hgb A2: 2.7 % (ref 1.8–3.2)
Hgb A: 97.3 % (ref 96.4–98.8)
Hgb F: 0 % (ref 0.0–2.0)
Hgb S: 0 %

## 2020-01-21 LAB — INTEGRATED 1
Crown Rump Length: 60.3 mm
Gest. Age on Collection Date: 12.3 weeks
Maternal Age at EDD: 38.7 yr
Nuchal Translucency (NT): 1.2 mm
Number of Fetuses: 1
PAPP-A Value: 1083.3 ng/mL
Weight: 176 [lb_av]

## 2020-01-21 LAB — PMP SCREEN PROFILE (10S), URINE
Amphetamine Scrn, Ur: NEGATIVE ng/mL
BARBITURATE SCREEN URINE: NEGATIVE ng/mL
BENZODIAZEPINE SCREEN, URINE: NEGATIVE ng/mL
CANNABINOIDS UR QL SCN: NEGATIVE ng/mL
Cocaine (Metab) Scrn, Ur: NEGATIVE ng/mL
Creatinine(Crt), U: 280.4 mg/dL (ref 20.0–300.0)
Methadone Screen, Urine: NEGATIVE ng/mL
OXYCODONE+OXYMORPHONE UR QL SCN: NEGATIVE ng/mL
Opiate Scrn, Ur: NEGATIVE ng/mL
Ph of Urine: 8.3 (ref 4.5–8.9)
Phencyclidine Qn, Ur: NEGATIVE ng/mL
Propoxyphene Scrn, Ur: NEGATIVE ng/mL

## 2020-01-21 LAB — URINE CULTURE

## 2020-01-21 LAB — GC/CHLAMYDIA PROBE AMP
Chlamydia trachomatis, NAA: NEGATIVE
Neisseria Gonorrhoeae by PCR: NEGATIVE

## 2020-01-21 LAB — HCV INTERPRETATION

## 2020-02-07 ENCOUNTER — Other Ambulatory Visit: Payer: Self-pay

## 2020-02-07 ENCOUNTER — Ambulatory Visit
Admission: EM | Admit: 2020-02-07 | Discharge: 2020-02-07 | Disposition: A | Discharge status: Home / Self Care | Payer: Medicaid Other | Attending: Emergency Medicine | Admitting: Emergency Medicine

## 2020-02-07 ENCOUNTER — Encounter: Payer: Self-pay | Admitting: Emergency Medicine

## 2020-02-07 DIAGNOSIS — Z1152 Encounter for screening for COVID-19: Secondary | ICD-10-CM

## 2020-02-07 DIAGNOSIS — A084 Viral intestinal infection, unspecified: Secondary | ICD-10-CM

## 2020-02-07 MED ORDER — MECLIZINE HCL 12.5 MG PO TABS
12.5000 mg | ORAL_TABLET | Freq: Three times a day (TID) | ORAL | 0 refills | Status: DC | PRN
Start: 2020-02-07 — End: 2020-04-23

## 2020-02-07 NOTE — ED Provider Notes (Signed)
[facility]   [id number] 02/07/20 Arrival Time: 1736  CC: ABDOMINAL DISCOMFORT  SUBJECTIVE:  Michelle Pace is a 40 y.o. female who is 3 months pregnant presented to the urgent care for complaint of nausea, vomiting and diarrhea that started this morning.  Has her boyfriend with the same symptom.  Denies abdominal pain.  Has tried OTC medications without relief.  Denies alleviating or aggravating factors.  Denies similar symptoms in the past.  Last BM today and was loose. Denies fever, chills, appetite changes, weight changes,  chest pain, SOB, diarrhea, constipation, hematochezia, melena, dysuria, difficulty urinating, increased frequency or urgency, flank pain, loss of bowel or bladder function, vaginal discharge, vaginal odor, vaginal bleeding, dyspareunia, pelvic pain.     Patient's last menstrual period was 10/19/2019.  ROS: As per HPI.  All other pertinent ROS negative.     Past Medical History:  Diagnosis Date  . Panic attacks    Past Surgical History:  Procedure Laterality Date  . WISDOM TOOTH EXTRACTION  11/2011   No Known Allergies No current facility-administered medications on file prior to encounter.   Current Outpatient Medications on File Prior to Encounter  Medication Sig Dispense Refill  . aspirin 81 MG chewable tablet Chew 2 tablets (162 mg total) by mouth daily. 60 tablet 7  . Blood Pressure Monitor MISC For regular home bp monitoring during pregnancy 1 each 0  . escitalopram (LEXAPRO) 20 MG tablet TAKE 1 TABLET BY MOUTH ONCE DAILY 30 tablet 11  . nitrofurantoin, macrocrystal-monohydrate, (MACROBID) 100 MG capsule Take 1 capsule (100 mg total) by mouth 2 (two) times daily. 14 capsule 0  . prenatal vitamin w/FE, FA (PRENATAL 1 + 1) 27-1 MG TABS tablet Take 1 tablet by mouth daily at 12 noon. 30 tablet 12   Social History   Socioeconomic History  . Marital status: Single    Spouse name: Not on file  . Number of children: Not on file  . Years of  education: Not on file  . Highest education level: Not on file  Occupational History  . Not on file  Tobacco Use  . Smoking status: Never Smoker  . Smokeless tobacco: Never Used  Vaping Use  . Vaping Use: Never used  Substance and Sexual Activity  . Alcohol use: No  . Drug use: No  . Sexual activity: Yes    Birth control/protection: None  Other Topics Concern  . Not on file  Social History Narrative  . Not on file   Social Determinants of Health   Financial Resource Strain: Low Risk   . Difficulty of Paying Living Expenses: Not hard at all  Food Insecurity: No Food Insecurity  . Worried About Programme researcher, broadcasting/film/video in the Last Year: Never true  . Ran Out of Food in the Last Year: Never true  Transportation Needs: No Transportation Needs  . Lack of Transportation (Medical): No  . Lack of Transportation (Non-Medical): No  Physical Activity: Insufficiently Active  . Days of Exercise per Week: 1 day  . Minutes of Exercise per Session: 10 min  Stress: No Stress Concern Present  . Feeling of Stress : Only a little  Social Connections: Moderately Isolated  . Frequency of Communication with Friends and Family: More than three times a week  . Frequency of Social Gatherings with Friends and Family: Once a week  . Attends Religious Services: Never  . Active Member of Clubs or Organizations: No  . Attends Banker Meetings: Never  .  Marital Status: Living with partner  Intimate Partner Violence: Not At Risk  . Fear of Current or Ex-Partner: No  . Emotionally Abused: No  . Physically Abused: No  . Sexually Abused: No   Family History  Problem Relation Age of Onset  . Other Mother        brain tumor  . Cancer Father   . Stroke Maternal Grandmother   . Asthma Son      OBJECTIVE:  Vitals:   02/07/20 1821  BP: (!) 139/99  Pulse: (!) 130  Resp: 15  Temp: 98.1 F (36.7 C)  SpO2: 98%    General appearance: Alert; NAD HEENT: NCAT.  Oropharynx clear.  Lungs:  clear to auscultation bilaterally without adventitious breath sounds Heart: regular rate and rhythm.  Radial pulses 2+ symmetrical bilaterally Abdomen: soft, non-distended; normal active bowel sounds; non-tender to light and deep palpation; nontender at McBurney's point; negative Murphy's sign; negative rebound; no guarding Back: no CVA tenderness Extremities: no edema; symmetrical with no gross deformities Skin: warm and dry Neurologic: normal gait Psychological: alert and cooperative; normal mood and affect  LABS: No results found for this or any previous visit (from the past 24 hour(s)).  DIAGNOSTIC STUDIES: No results found.   ASSESSMENT & PLAN:  1. Encounter for screening for COVID-19   2. Viral gastroenteritis     Meds ordered this encounter  Medications  . meclizine (ANTIVERT) 12.5 MG tablet    Sig: Take 1 tablet (12.5 mg total) by mouth 3 (three) times daily as needed for nausea.    Dispense:  30 tablet    Refill:  0    Discharge instructions  Get rest and drink fluids Meclizine prescribed.  Take as directed.    DIET Instructions:  30 minutes after taking nausea medicine, begin with sips of clear liquids. If able to hold down 2 - 4 ounces for 30 minutes, begin drinking more. Increase your fluid intake to replace losses. Clear liquids only for 24 hours (water, tea, sport drinks, clear flat ginger ale or cola and juices, broth, jello, popsicles, ect). Advance to bland foods, applesauce, rice, baked or boiled chicken, ect. Avoid milk, greasy foods and anything that doesn't agree with you.  If you experience new or worsening symptoms return or go to ER such as fever, chills, nausea, vomiting, diarrhea, bloody or dark tarry stools, constipation, urinary symptoms, worsening abdominal discomfort, symptoms that do not improve with medications, inability to keep fluids down, etc...  Reviewed expectations re: course of current medical issues. Questions answered. Outlined  signs and symptoms indicating need for more acute intervention. Patient verbalized understanding. After Visit Summary given.   Durward Parcel, FNP 02/07/20 1901

## 2020-02-07 NOTE — Discharge Instructions (Signed)
Get rest and drink fluids Meclizine prescribed.  Take as directed.    DIET Instructions:  30 minutes after taking nausea medicine, begin with sips of clear liquids. If able to hold down 2 - 4 ounces for 30 minutes, begin drinking more. Increase your fluid intake to replace losses. Clear liquids only for 24 hours (water, tea, sport drinks, clear flat ginger ale or cola and juices, broth, jello, popsicles, ect). Advance to bland foods, applesauce, rice, baked or boiled chicken, ect. Avoid milk, greasy foods and anything that doesn't agree with you.  If you experience new or worsening symptoms return or go to ER such as fever, chills, nausea, vomiting, diarrhea, bloody or dark tarry stools, constipation, urinary symptoms, worsening abdominal discomfort, symptoms that do not improve with medications, inability to keep fluids down, etc..Marland Kitchen

## 2020-02-07 NOTE — ED Triage Notes (Signed)
diarrhea and vomitting since this morning . Patient is pregnant- 3 months. is taking kaopectate wants covid test

## 2020-02-09 LAB — NOVEL CORONAVIRUS, NAA: SARS-CoV-2, NAA: NOT DETECTED

## 2020-02-09 LAB — SARS-COV-2, NAA 2 DAY TAT

## 2020-02-13 ENCOUNTER — Ambulatory Visit (INDEPENDENT_AMBULATORY_CARE_PROVIDER_SITE_OTHER): Payer: Medicaid Other | Admitting: Obstetrics and Gynecology

## 2020-02-13 ENCOUNTER — Other Ambulatory Visit: Payer: Self-pay

## 2020-02-13 ENCOUNTER — Encounter: Payer: Self-pay | Admitting: Obstetrics and Gynecology

## 2020-02-13 VITALS — BP 136/80 | HR 95 | Wt 178.0 lb

## 2020-02-13 DIAGNOSIS — Z3A16 16 weeks gestation of pregnancy: Secondary | ICD-10-CM

## 2020-02-13 DIAGNOSIS — Z3482 Encounter for supervision of other normal pregnancy, second trimester: Secondary | ICD-10-CM

## 2020-02-13 NOTE — Progress Notes (Signed)
   LOW-RISK PREGNANCY OFFICE VISIT Patient name: Michelle Pace MRN [id number]  Date of birth: 04-11-1980 Chief Complaint:   Routine Prenatal Visit  History of Present Illness:   Michelle Pace is a 40 y.o. [id number] female at [redacted]w[redacted]d with an Estimated Date of Delivery: 07/25/20 being seen today for ongoing management of a low-risk pregnancy.  Today she reports no complaints. She reports just getting over a stomach bug, but still has a cold. She reports testing (-) COVID-19 last week. Contractions: Not present. Vag. Bleeding: None.  Movement: Absent. denies leaking of fluid. Review of Systems:   Pertinent items are noted in HPI Denies abnormal vaginal discharge w/ itching/odor/irritation, headaches, visual changes, shortness of breath, chest pain, abdominal pain, severe nausea/vomiting, or problems with urination or bowel movements unless otherwise stated above. Pertinent History Reviewed:  Reviewed past medical,surgical, social, obstetrical and family history.  Reviewed problem list, medications and allergies. Physical Assessment:   Vitals:   02/13/20 0835  BP: 136/80  Pulse: 95  Weight: 178 lb (80.7 kg)  Body mass index is 34.76 kg/m.        Physical Examination:   General appearance: Well appearing, and in no distress  Mental status: Alert, oriented to person, place, and time  Skin: Warm & dry  Cardiovascular: Normal heart rate noted  Respiratory: Normal respiratory effort, no distress  Abdomen: Soft, gravid, nontender  Pelvic: Cervical exam deferred         Extremities: Edema: None  Fetal Status: Fetal Heart Rate (bpm): 145   Movement: Absent    No results found for this or any previous visit (from the past 24 hour(s)).  Assessment & Plan:  1) Low-risk pregnancy [id number] at [redacted]w[redacted]d with an Estimated Date of Delivery: 07/25/20   2) Encounter for supervision of other normal pregnancy, second trimester  - INTEGRATED 2 - Advised to continue checking BP daily and report any  abnormal BP values to the office  3) [redacted] weeks gestation of pregnancy   Meds: No orders of the defined types were placed in this encounter.  Labs/procedures today: IT 2  Plan:  Continue routine obstetrical care   Reviewed: Preterm labor symptoms and general obstetric precautions including but not limited to vaginal bleeding, contractions, leaking of fluid and fetal movement were reviewed in detail with the patient.  All questions were answered. Has home bp cuff. Check bp weekly, let us know if >140/90.   Follow-up: Return in about 4 weeks (around 03/12/2020) for Return OB - My Chart video.  Orders Placed This Encounter  Procedures  . INTEGRATED 2   Raelyn Mora MSN, CNM 02/13/2020 8:47 AM

## 2020-02-15 LAB — INTEGRATED 2
AFP MoM: 1.3
Alpha-Fetoprotein: 38.3 ng/mL
Crown Rump Length: 60.3 mm
DIA MoM: 1.2
DIA Value: 172.9 pg/mL
Estriol, Unconjugated: 1.46 ng/mL
Gest. Age on Collection Date: 12.3 weeks
Gestational Age: 16.3 weeks
Maternal Age at EDD: 38.7 yr
Nuchal Translucency (NT): 1.2 mm
Nuchal Translucency MoM: 0.89
Number of Fetuses: 1
PAPP-A MoM: 1.42
PAPP-A Value: 1083.3 ng/mL
Test Results:: NEGATIVE
Weight: 176 [lb_av]
Weight: 176 [lb_av]
hCG MoM: 0.95
hCG Value: 29.5 IU/mL
uE3 MoM: 1.53

## 2020-03-05 ENCOUNTER — Ambulatory Visit (INDEPENDENT_AMBULATORY_CARE_PROVIDER_SITE_OTHER): Payer: Medicaid Other

## 2020-03-05 ENCOUNTER — Ambulatory Visit (INDEPENDENT_AMBULATORY_CARE_PROVIDER_SITE_OTHER): Payer: Medicaid Other | Admitting: Women's Health

## 2020-03-05 ENCOUNTER — Other Ambulatory Visit: Payer: Self-pay

## 2020-03-05 VITALS — BP 126/86 | HR 83 | Wt 176.0 lb

## 2020-03-05 DIAGNOSIS — Z3480 Encounter for supervision of other normal pregnancy, unspecified trimester: Secondary | ICD-10-CM

## 2020-03-05 DIAGNOSIS — R030 Elevated blood-pressure reading, without diagnosis of hypertension: Secondary | ICD-10-CM

## 2020-03-05 DIAGNOSIS — Z363 Encounter for antenatal screening for malformations: Secondary | ICD-10-CM | POA: Diagnosis not present

## 2020-03-05 DIAGNOSIS — Z1389 Encounter for screening for other disorder: Secondary | ICD-10-CM

## 2020-03-05 DIAGNOSIS — F410 Panic disorder [episodic paroxysmal anxiety] without agoraphobia: Secondary | ICD-10-CM

## 2020-03-05 DIAGNOSIS — O99891 Other specified diseases and conditions complicating pregnancy: Secondary | ICD-10-CM

## 2020-03-05 DIAGNOSIS — Z3482 Encounter for supervision of other normal pregnancy, second trimester: Secondary | ICD-10-CM

## 2020-03-05 DIAGNOSIS — O09899 Supervision of other high risk pregnancies, unspecified trimester: Secondary | ICD-10-CM

## 2020-03-05 DIAGNOSIS — O2341 Unspecified infection of urinary tract in pregnancy, first trimester: Secondary | ICD-10-CM

## 2020-03-05 DIAGNOSIS — O09523 Supervision of elderly multigravida, third trimester: Secondary | ICD-10-CM

## 2020-03-05 NOTE — Progress Notes (Signed)
   LOW-RISK PREGNANCY VISIT Patient name: Michelle Pace MRN [id number]  Date of birth: Oct 14, 1980 Chief Complaint:   Routine Prenatal Visit  History of Present Illness:   Michelle Pace is a 40 y.o. [id number] female at [redacted]w[redacted]d with an Estimated Date of Delivery: 07/25/20 being seen today for ongoing management of a low-risk pregnancy.  Depression screen [facility] 2/9 01/16/2020 10/31/2019  Decreased Interest 0 0  Down, Depressed, Hopeless 0 2  PHQ - 2 Score 0 2  Altered sleeping 1 0  Tired, decreased energy 1 0  Change in appetite 0 0  Feeling bad or failure about yourself  0 0  Trouble concentrating 0 0  Moving slowly or fidgety/restless 0 0  Suicidal thoughts 0 0  PHQ-9 Score 2 2    Today she reports no complaints. Contractions: Not present. Vag. Bleeding: None.  Movement: Present. denies leaking of fluid. Review of Systems:   Pertinent items are noted in HPI Denies abnormal vaginal discharge w/ itching/odor/irritation, headaches, visual changes, shortness of breath, chest pain, abdominal pain, severe nausea/vomiting, or problems with urination or bowel movements unless otherwise stated above. Pertinent History Reviewed:  Reviewed past medical,surgical, social, obstetrical and family history.  Reviewed problem list, medications and allergies. Physical Assessment:   Vitals:   03/05/20 1103  BP: 126/86  Pulse: 83  Weight: 176 lb (79.8 kg)  Body mass index is 34.37 kg/m.        Physical Examination:   General appearance: Well appearing, and in no distress  Mental status: Alert, oriented to person, place, and time  Skin: Warm & dry  Cardiovascular: Normal heart rate noted  Respiratory: Normal respiratory effort, no distress  Abdomen: Soft, gravid, nontender  Pelvic: Cervical exam deferred         Extremities: Edema: None  Fetal Status:     Movement: Present   [place] 19+5 wks,cephalic,anterior placenta gr 0,normal ovaries,fhr 166 bpm,cx 3.8 cm,svp of fluid 4.8 cm,EFW 299 g  35%,anatomy complete,no obvious abnormalities   Chaperone: N/A   No results found for this or any previous visit (from the past 24 hour(s)).  Assessment & Plan:  1) Low-risk pregnancy [id number] at [redacted]w[redacted]d with an Estimated Date of Delivery: 07/25/20   2) Recent UTI, urine cx poc today   Meds: No orders of the defined types were placed in this encounter.  Labs/procedures today: u/s  Plan:  Continue routine obstetrical care  Next visit: prefers in person    Reviewed: Preterm labor symptoms and general obstetric precautions including but not limited to vaginal bleeding, contractions, leaking of fluid and fetal movement were reviewed in detail with the patient.  All questions were answered. Has home bp cuff. Check bp weekly, let us know if >140/90.   Follow-up: Return in about 4 weeks (around 04/02/2020) for LROB, CNM, in person.  Future Appointments  Date Time Provider Department Center  04/02/2020  9:30 AM Arabella Merles, CNM [facility]    Orders Placed This Encounter  Procedures  . Urine Culture   Cheral Marker CNM, [facility] 03/05/2020 11:35 AM

## 2020-03-05 NOTE — Progress Notes (Signed)
[place] 19+5 wks,cephalic,anterior placenta gr 0,normal ovaries,fhr 166 bpm,cx 3.8 cm,svp of fluid 4.8 cm,EFW 299 g 35%,anatomy complete,no obvious abnormalities

## 2020-03-05 NOTE — Patient Instructions (Signed)
Baxter Hire, I greatly value your feedback.  If you receive a survey following your visit with Michelle Pace today, we appreciate you taking the time to fill it out.  Thanks, Joellyn Haff, CNM, WHNP-BC  [facility] ([address] [place], [place] [address]) Entrance C, located off of [address] valet parking  Go to [email or web address] to register for FREE online childbirth classes  [place] Pediatricians/Family Doctors:  [facility] [phone]            [facility] [phone]                 [facility] [phone] (usually not accepting new patients unless you have family there already, you are always welcome to call and ask)       [facility] Department [phone]       [facility] Pediatricians/Family Doctors:   [facility]: [phone]  [facility]: [phone]  [facility]: [phone]  [facility] Doctors:   [facility]: [phone]   [facility]: [phone]  [facility] Doctors:  [facility]: [phone]    Home Blood Pressure Monitoring for Patients   Your provider has recommended that you check your blood pressure (BP) at least once a week at home. If you do not have a blood pressure cuff at home, one will be provided for you. Contact your provider if you have not received your monitor within 1 week.   Helpful Tips for Accurate Home Blood Pressure Checks  . Don't smoke, exercise, or drink caffeine 30 minutes before checking your BP . Use the restroom before checking your BP (a full bladder can raise your pressure) . Relax in a comfortable upright chair . Feet on the ground . Left arm resting comfortably on a flat surface at the level of your heart . Legs uncrossed . Back supported . Sit quietly and don't talk . Place the cuff on your bare arm . Adjust snuggly, so  that only two fingertips can fit between your skin and the top of the cuff . Check 2 readings separated by at least one minute . Keep a log of your BP readings . For a visual, please reference this diagram: http://ccnc.care/bpdiagram  Provider Name: [facility]     Phone: [phone]  Zone 1: ALL CLEAR  Continue to monitor your symptoms:  . BP reading is less than 140 (top number) or less than 90 (bottom number)  . No right upper stomach pain . No headaches or seeing spots . No feeling nauseated or throwing up . No swelling in face and hands  Zone 2: CAUTION Call your doctor's office for any of the following:  . BP reading is greater than 140 (top number) or greater than 90 (bottom number)  . Stomach pain under your ribs in the middle or right side . Headaches or seeing spots . Feeling nauseated or throwing up . Swelling in face and hands  Zone 3: EMERGENCY  Seek immediate medical care if you have any of the following:  . BP reading is greater than160 (top number) or greater than 110 (bottom number) . Severe headaches not improving with Tylenol . Serious difficulty catching your breath . Any worsening symptoms from Zone 2     Second Trimester of Pregnancy The second trimester is from week 14 through week 27 (months 4 through 6). The second trimester is often a time when you feel your  best. Your body has adjusted to being pregnant, and you begin to feel better physically. Usually, morning sickness has lessened or quit completely, you may have more energy, and you may have an increase in appetite. The second trimester is also a time when the fetus is growing rapidly. At the end of the sixth month, the fetus is about 9 inches long and weighs about 1 pounds. You will likely begin to feel the baby move (quickening) between 16 and 20 weeks of pregnancy. Body changes during your second trimester Your body continues to go through many changes during your second trimester. The  changes vary from woman to woman.  Your weight will continue to increase. You will notice your lower abdomen bulging out.  You may begin to get stretch marks on your hips, abdomen, and breasts.  You may develop headaches that can be relieved by medicines. The medicines should be approved by your health care provider.  You may urinate more often because the fetus is pressing on your bladder.  You may develop or continue to have heartburn as a result of your pregnancy.  You may develop constipation because certain hormones are causing the muscles that push waste through your intestines to slow down.  You may develop hemorrhoids or swollen, bulging veins (varicose veins).  You may have back pain. This is caused by: ? Weight gain. ? Pregnancy hormones that are relaxing the joints in your pelvis. ? A shift in weight and the muscles that support your balance.  Your breasts will continue to grow and they will continue to become tender.  Your gums may bleed and may be sensitive to brushing and flossing.  Dark spots or blotches (chloasma, mask of pregnancy) may develop on your face. This will likely fade after the baby is born.  A dark line from your belly button to the pubic area (linea nigra) may appear. This will likely fade after the baby is born.  You may have changes in your hair. These can include thickening of your hair, rapid growth, and changes in texture. Some women also have hair loss during or after pregnancy, or hair that feels dry or thin. Your hair will most likely return to normal after your baby is born.  What to expect at prenatal visits During a routine prenatal visit:  You will be weighed to make sure you and the fetus are growing normally.  Your blood pressure will be taken.  Your abdomen will be measured to track your baby's growth.  The fetal heartbeat will be listened to.  Any test results from the previous visit will be discussed.  Your health care  provider may ask you:  How you are feeling.  If you are feeling the baby move.  If you have had any abnormal symptoms, such as leaking fluid, bleeding, severe headaches, or abdominal cramping.  If you are using any tobacco products, including cigarettes, chewing tobacco, and electronic cigarettes.  If you have any questions.  Other tests that may be performed during your second trimester include:  Blood tests that check for: ? Low iron levels (anemia). ? High blood sugar that affects pregnant women (gestational diabetes) between 71 and 28 weeks. ? Rh antibodies. This is to check for a protein on red blood cells (Rh factor).  Urine tests to check for infections, diabetes, or protein in the urine.  An ultrasound to confirm the proper growth and development of the baby.  An amniocentesis to check for possible genetic problems.  Fetal  screens for spina bifida and Down syndrome.  HIV (human immunodeficiency virus) testing. Routine prenatal testing includes screening for HIV, unless you choose not to have this test.  Follow these instructions at home: Medicines  Follow your health care provider's instructions regarding medicine use. Specific medicines may be either safe or unsafe to take during pregnancy.  Take a prenatal vitamin that contains at least 600 micrograms (mcg) of folic acid.  If you develop constipation, try taking a stool softener if your health care provider approves. Eating and drinking  Eat a balanced diet that includes fresh fruits and vegetables, whole grains, good sources of protein such as meat, eggs, or tofu, and low-fat dairy. Your health care provider will help you determine the amount of weight gain that is right for you.  Avoid raw meat and uncooked cheese. These carry germs that can cause birth defects in the baby.  If you have low calcium intake from food, talk to your health care provider about whether you should take a daily calcium  supplement.  Limit foods that are high in fat and processed sugars, such as fried and sweet foods.  To prevent constipation: ? Drink enough fluid to keep your urine clear or pale yellow. ? Eat foods that are high in fiber, such as fresh fruits and vegetables, whole grains, and beans. Activity  Exercise only as directed by your health care provider. Most women can continue their usual exercise routine during pregnancy. Try to exercise for 30 minutes at least 5 days a week. Stop exercising if you experience uterine contractions.  Avoid heavy lifting, wear low heel shoes, and practice good posture.  A sexual relationship may be continued unless your health care provider directs you otherwise. Relieving pain and discomfort  Wear a good support bra to prevent discomfort from breast tenderness.  Take warm sitz baths to soothe any pain or discomfort caused by hemorrhoids. Use hemorrhoid cream if your health care provider approves.  Rest with your legs elevated if you have leg cramps or low back pain.  If you develop varicose veins, wear support hose. Elevate your feet for 15 minutes, 3-4 times a day. Limit salt in your diet. Prenatal Care  Write down your questions. Take them to your prenatal visits.  Keep all your prenatal visits as told by your health care provider. This is important. Safety  Wear your seat belt at all times when driving.  Make a list of emergency phone numbers, including numbers for family, friends, the hospital, and police and fire departments. General instructions  Ask your health care provider for a referral to a local prenatal education class. Begin classes no later than the beginning of month 6 of your pregnancy.  Ask for help if you have counseling or nutritional needs during pregnancy. Your health care provider can offer advice or refer you to specialists for help with various needs.  Do not use hot tubs, steam rooms, or saunas.  Do not douche or use  tampons or scented sanitary pads.  Do not cross your legs for long periods of time.  Avoid cat litter boxes and soil used by cats. These carry germs that can cause birth defects in the baby and possibly loss of the fetus by miscarriage or stillbirth.  Avoid all smoking, herbs, alcohol, and unprescribed drugs. Chemicals in these products can affect the formation and growth of the baby.  Do not use any products that contain nicotine or tobacco, such as cigarettes and e-cigarettes. If you need help  quitting, ask your health care provider.  Visit your dentist if you have not gone yet during your pregnancy. Use a soft toothbrush to brush your teeth and be gentle when you floss. Contact a health care provider if:  You have dizziness.  You have mild pelvic cramps, pelvic pressure, or nagging pain in the abdominal area.  You have persistent nausea, vomiting, or diarrhea.  You have a bad smelling vaginal discharge.  You have pain when you urinate. Get help right away if:  You have a fever.  You are leaking fluid from your vagina.  You have spotting or bleeding from your vagina.  You have severe abdominal cramping or pain.  You have rapid weight gain or weight loss.  You have shortness of breath with chest pain.  You notice sudden or extreme swelling of your face, hands, ankles, feet, or legs.  You have not felt your baby move in over an hour.  You have severe headaches that do not go away when you take medicine.  You have vision changes. Summary  The second trimester is from week 14 through week 27 (months 4 through 6). It is also a time when the fetus is growing rapidly.  Your body goes through many changes during pregnancy. The changes vary from woman to woman.  Avoid all smoking, herbs, alcohol, and unprescribed drugs. These chemicals affect the formation and growth your baby.  Do not use any tobacco products, such as cigarettes, chewing tobacco, and e-cigarettes. If you  need help quitting, ask your health care provider.  Contact your health care provider if you have any questions. Keep all prenatal visits as told by your health care provider. This is important. This information is not intended to replace advice given to you by your health care provider. Make sure you discuss any questions you have with your health care provider. Document Released: 12/29/2000 Document Revised: 06/12/2015 Document Reviewed: 03/07/2012 Elsevier Interactive Patient Education  2017 [employer]   

## 2020-03-07 LAB — URINE CULTURE

## 2020-04-02 ENCOUNTER — Other Ambulatory Visit: Payer: Self-pay

## 2020-04-02 ENCOUNTER — Ambulatory Visit (INDEPENDENT_AMBULATORY_CARE_PROVIDER_SITE_OTHER): Payer: Medicaid Other | Admitting: Advanced Practice Midwife

## 2020-04-02 VITALS — BP 126/89 | HR 96 | Wt 178.0 lb

## 2020-04-02 DIAGNOSIS — Z3A23 23 weeks gestation of pregnancy: Secondary | ICD-10-CM

## 2020-04-02 DIAGNOSIS — Z3480 Encounter for supervision of other normal pregnancy, unspecified trimester: Secondary | ICD-10-CM

## 2020-04-02 DIAGNOSIS — O10919 Unspecified pre-existing hypertension complicating pregnancy, unspecified trimester: Secondary | ICD-10-CM | POA: Insufficient documentation

## 2020-04-02 NOTE — Progress Notes (Signed)
   HIGH-RISK PREGNANCY VISIT Patient name: Michelle Pace MRN [id number]  Date of birth: [birth year] Chief Complaint:   Routine Prenatal Visit  History of Present Illness:   Michelle Pace is a 40 y.o. [id number] female at 26w5dwith an Estimated Date of Delivery: 07/25/20 being seen today for ongoing management of a high-risk pregnancy complicated by chronic hypertension currently on no meds- dx today based on elevation at 12wks and today.  Today she reports no complaints; denies s/s pre-e. Doing well on Lexapro. Depression screen [facility]2/9 01/16/2020 10/31/2019  Decreased Interest 0 0  Down, Depressed, Hopeless 0 2  PHQ - 2 Score 0 2  Altered sleeping 1 0  Tired, decreased energy 1 0  Change in appetite 0 0  Feeling bad or failure about yourself  0 0  Trouble concentrating 0 0  Moving slowly or fidgety/restless 0 0  Suicidal thoughts 0 0  PHQ-9 Score 2 2    Contractions: Not present. Vag. Bleeding: None.  Movement: Present. denies leaking of fluid.  Review of Systems:   Pertinent items are noted in HPI Denies abnormal vaginal discharge w/ itching/odor/irritation, headaches, visual changes, shortness of breath, chest pain, abdominal pain, severe nausea/vomiting, or problems with urination or bowel movements unless otherwise stated above. Pertinent History Reviewed:  Reviewed past medical,surgical, social, obstetrical and family history.  Reviewed problem list, medications and allergies. Physical Assessment:   Vitals:   04/02/20 0951 04/02/20 0954  BP: (!) 133/94 126/89  Pulse: 95 96  Weight: 178 lb (80.7 kg)   Body mass index is 34.76 kg/m.           Physical Examination:   General appearance: alert, well appearing, and in no distress  Mental status: alert, oriented to person, place, and time  Skin: warm & dry   Extremities: Edema: None    Cardiovascular: normal heart rate noted  Respiratory: normal respiratory effort, no distress  Abdomen: gravid, soft, non-tender  Pelvic:  Cervical exam deferred         Fetal Status: Fetal Heart Rate (bpm): 154 Fundal Height: 24 cm Movement: Present    Fetal Surveillance Testing today: doppler    No results found for this or any previous visit (from the past 24 hour(s)).  Assessment & Plan:  High-risk pregnancy: [id number] 215w5dith an Estimated Date of Delivery: 07/25/20   1) cHTN, dx today based on 12wk elevation and today; baseline labs collected; has been taking bASA 162 daily; will hold off on meds for now  2) Anxiety, stable on Lexapro  Meds: No orders of the defined types were placed in this encounter.   Labs/procedures today: CMET and urine p/c ratio  Treatment Plan:  Growth at 28-32-36, 2x/wk testing at 36wks, IOL at 39wks (assuming no antihypertensives)  Reviewed: Preterm labor symptoms and general obstetric precautions including but not limited to vaginal bleeding, contractions, leaking of fluid and fetal movement were reviewed in detail with the patient.  All questions were answered. Has home bp cuff. Check bp weekly, let usKoreanow if >140/90.   Follow-up: Return in about 3 weeks (around 04/23/2020) for PN2, in person, HROB.   No future appointments.  Orders Placed This Encounter  Procedures  . Comp Met (CMET)  . Protein / creatinine ratio, urine   [facility] [place]/16/2022 10:19 AM

## 2020-04-02 NOTE — Patient Instructions (Signed)
Michelle Pace, I greatly value your feedback.  If you receive a survey following your visit with [place] today, we appreciate you taking the time to fill it out.  Thanks, Philipp Deputy, CNM   You will have your sugar test next visit.  Please do not eat or drink anything after midnight the night before you come, not even water.  You will be here for at least two hours.  Please make an appointment online for the bloodwork at [email or web address] for 8:30am (or as close to this as possible). Make sure you select the [facility] service center. The day of the appointment, check in with our office first, then you will go to [facility] to start the sugar test.    [facility] HAS MOVED!!! It is now [facility] at [facility] ([address] [place], [place] [address]) Entrance C, located off of [address] valet parking  Go to [email or web address] to register for FREE online childbirth classes   Call the office [phone]) or go to [facility] if:  You begin to have strong, frequent contractions  Your water breaks.  Sometimes it is a big gush of fluid, sometimes it is just a trickle that keeps getting your panties wet or running down your legs  You have vaginal bleeding.  It is normal to have a small amount of spotting if your cervix was checked.   You don't feel your baby moving like normal.  If you don't, get you something to eat and drink and lay down and focus on feeling your baby move.   If your baby is still not moving like normal, you should call the office or go to [facility].  [place] [employer] Pediatricians/Family Doctors:  [facility] [phone]            [facility] [phone]                 [facility] [phone] (usually not accepting new patients unless you have family there already, you are always welcome to call and ask)       [facility] Department [phone]       [facility] Pediatricians/Family Doctors:    [facility]: [phone]  [facility]: [phone]  [facility]: [phone]  [facility] Doctors:   [facility]: [phone]   [facility]: [phone]  [facility] Doctors:  [facility]: [phone]   Home Blood Pressure Monitoring for Patients   Your provider has recommended that you check your blood pressure (BP) at least once a week at home. If you do not have a blood pressure cuff at home, one will be provided for you. Contact your provider if you have not received your monitor within 1 week.   Helpful Tips for Accurate Home Blood Pressure Checks  . Don't smoke, exercise, or drink caffeine 30 minutes before checking your BP . Use the restroom before checking your BP (a full bladder can raise your pressure) . Relax in a comfortable upright chair . Feet on the ground . Left arm resting comfortably on a flat surface at the level of your heart . Legs uncrossed . Back supported . Sit quietly and don't talk . Place the cuff on your bare arm . Adjust snuggly, so that only two fingertips can fit between your skin and the top of the cuff . Check 2 readings separated by at least one minute . Keep a log of your BP  readings . For a visual, please reference this diagram: http://ccnc.care/bpdiagram  Provider Name: [facility]     Phone: [phone]  Zone 1: ALL CLEAR  Continue to monitor your symptoms:  . BP reading is less than 140 (top number) or less than 90 (bottom number)  . No right upper stomach pain . No headaches or seeing spots . No feeling nauseated or throwing up . No swelling in face and hands  Zone 2: CAUTION Call your doctor's office for any of the following:  . BP reading is greater than 140 (top number) or greater than 90 (bottom number)  . Stomach pain under your ribs in the middle or right side . Headaches or seeing spots . Feeling  nauseated or throwing up . Swelling in face and hands  Zone 3: EMERGENCY  Seek immediate medical care if you have any of the following:  . BP reading is greater than160 (top number) or greater than 110 (bottom number) . Severe headaches not improving with Tylenol . Serious difficulty catching your breath . Any worsening symptoms from Zone 2   Second Trimester of Pregnancy The second trimester is from week 13 through week 28, months 4 through 6. The second trimester is often a time when you feel your best. Your body has also adjusted to being pregnant, and you begin to feel better physically. Usually, morning sickness has lessened or quit completely, you may have more energy, and you may have an increase in appetite. The second trimester is also a time when the fetus is growing rapidly. At the end of the sixth month, the fetus is about 9 inches long and weighs about 1 pounds. You will likely begin to feel the baby move (quickening) between 18 and 20 weeks of the pregnancy. BODY CHANGES Your body goes through many changes during pregnancy. The changes vary from woman to woman.   Your weight will continue to increase. You will notice your lower abdomen bulging out.  You may begin to get stretch marks on your hips, abdomen, and breasts.  You may develop headaches that can be relieved by medicines approved by your health care provider.  You may urinate more often because the fetus is pressing on your bladder.  You may develop or continue to have heartburn as a result of your pregnancy.  You may develop constipation because certain hormones are causing the muscles that push waste through your intestines to slow down.  You may develop hemorrhoids or swollen, bulging veins (varicose veins).  You may have back pain because of the weight gain and pregnancy hormones relaxing your joints between the bones in your pelvis and as a result of a shift in weight and the muscles that support your  balance.  Your breasts will continue to grow and be tender.  Your gums may bleed and may be sensitive to brushing and flossing.  Dark spots or blotches (chloasma, mask of pregnancy) may develop on your face. This will likely fade after the baby is born.  A dark line from your belly button to the pubic area (linea nigra) may appear. This will likely fade after the baby is born.  You may have changes in your hair. These can include thickening of your hair, rapid growth, and changes in texture. Some women also have hair loss during or after pregnancy, or hair that feels dry or thin. Your hair will most likely return to normal after your baby is born. WHAT TO EXPECT AT YOUR PRENATAL VISITS During  a routine prenatal visit:  You will be weighed to make sure you and the fetus are growing normally.  Your blood pressure will be taken.  Your abdomen will be measured to track your baby's growth.  The fetal heartbeat will be listened to.  Any test results from the previous visit will be discussed. Your health care provider may ask you:  How you are feeling.  If you are feeling the baby move.  If you have had any abnormal symptoms, such as leaking fluid, bleeding, severe headaches, or abdominal cramping.  If you have any questions. Other tests that may be performed during your second trimester include:  Blood tests that check for:  Low iron levels (anemia).  Gestational diabetes (between 24 and 28 weeks).  Rh antibodies.  Urine tests to check for infections, diabetes, or protein in the urine.  An ultrasound to confirm the proper growth and development of the baby.  An amniocentesis to check for possible genetic problems.  Fetal screens for spina bifida and Down syndrome. HOME CARE INSTRUCTIONS   Avoid all smoking, herbs, alcohol, and unprescribed drugs. These chemicals affect the formation and growth of the baby.  Follow your health care provider's instructions regarding  medicine use. There are medicines that are either safe or unsafe to take during pregnancy.  Exercise only as directed by your health care provider. Experiencing uterine cramps is a good sign to stop exercising.  Continue to eat regular, healthy meals.  Wear a good support bra for breast tenderness.  Do not use hot tubs, steam rooms, or saunas.  Wear your seat belt at all times when driving.  Avoid raw meat, uncooked cheese, cat litter boxes, and soil used by cats. These carry germs that can cause birth defects in the baby.  Take your prenatal vitamins.  Try taking a stool softener (if your health care provider approves) if you develop constipation. Eat more high-fiber foods, such as fresh vegetables or fruit and whole grains. Drink plenty of fluids to keep your urine clear or pale yellow.  Take warm sitz baths to soothe any pain or discomfort caused by hemorrhoids. Use hemorrhoid cream if your health care provider approves.  If you develop varicose veins, wear support hose. Elevate your feet for 15 minutes, 3-4 times a day. Limit salt in your diet.  Avoid heavy lifting, wear low heel shoes, and practice good posture.  Rest with your legs elevated if you have leg cramps or low back pain.  Visit your dentist if you have not gone yet during your pregnancy. Use a soft toothbrush to brush your teeth and be gentle when you floss.  A sexual relationship may be continued unless your health care provider directs you otherwise.  Continue to go to all your prenatal visits as directed by your health care provider. SEEK MEDICAL CARE IF:   You have dizziness.  You have mild pelvic cramps, pelvic pressure, or nagging pain in the abdominal area.  You have persistent nausea, vomiting, or diarrhea.  You have a bad smelling vaginal discharge.  You have pain with urination. SEEK IMMEDIATE MEDICAL CARE IF:   You have a fever.  You are leaking fluid from your vagina.  You have spotting or  bleeding from your vagina.  You have severe abdominal cramping or pain.  You have rapid weight gain or loss.  You have shortness of breath with chest pain.  You notice sudden or extreme swelling of your face, hands, ankles, feet, or legs.  You  have not felt your baby move in over an hour.  You have severe headaches that do not go away with medicine.  You have vision changes. Document Released: 12/29/2000 Document Revised: 01/09/2013 Document Reviewed: 03/07/2012 [facility] Patient Information 2015 [place], [place]. This information is not intended to replace advice given to you by your health care provider. Make sure you discuss any questions you have with your health care provider.

## 2020-04-03 LAB — COMPREHENSIVE METABOLIC PANEL
ALT: 9 IU/L (ref 0–32)
AST: 9 IU/L (ref 0–40)
Albumin/Globulin Ratio: 1.4 (ref 1.2–2.2)
Albumin: 3.5 g/dL — ABNORMAL LOW (ref 3.8–4.8)
Alkaline Phosphatase: 74 IU/L (ref 44–121)
BUN/Creatinine Ratio: 8 — ABNORMAL LOW (ref 9–23)
BUN: 5 mg/dL — ABNORMAL LOW (ref 6–20)
Bilirubin Total: <0.2 mg/dL (ref 0.0–1.2)
CO2: 20 mmol/L (ref 20–29)
Calcium: 8.7 mg/dL (ref 8.7–10.2)
Chloride: 102 mmol/L (ref 96–106)
Creatinine, Ser: 0.65 mg/dL (ref 0.57–1.00)
Globulin, Total: 2.5 g/dL (ref 1.5–4.5)
Glucose: 77 mg/dL (ref 65–99)
Potassium: 4 mmol/L (ref 3.5–5.2)
Sodium: 135 mmol/L (ref 134–144)
Total Protein: 6 g/dL (ref 6.0–8.5)
eGFR: 115 mL/min/{1.73_m2} (ref >59)

## 2020-04-03 LAB — PROTEIN / CREATININE RATIO, URINE
Creatinine, Urine: 291.9 mg/dL
Protein, Ur: 50.4 mg/dL
Protein/Creat Ratio: 173 mg/g creat (ref 0–200)

## 2020-04-11 ENCOUNTER — Other Ambulatory Visit: Payer: Self-pay | Admitting: Advanced Practice Midwife

## 2020-04-11 MED ORDER — LABETALOL HCL 200 MG PO TABS
200.0000 mg | ORAL_TABLET | Freq: Two times a day (BID) | ORAL | 3 refills | Status: DC
Start: 2020-04-11 — End: 2020-07-15

## 2020-04-23 ENCOUNTER — Other Ambulatory Visit: Payer: Self-pay

## 2020-04-23 ENCOUNTER — Other Ambulatory Visit: Payer: Medicaid Other

## 2020-04-23 ENCOUNTER — Encounter: Payer: Self-pay | Admitting: Advanced Practice Midwife

## 2020-04-23 ENCOUNTER — Ambulatory Visit (INDEPENDENT_AMBULATORY_CARE_PROVIDER_SITE_OTHER): Payer: Medicaid Other | Admitting: Advanced Practice Midwife

## 2020-04-23 VITALS — BP 126/85 | HR 93 | Wt 179.0 lb

## 2020-04-23 DIAGNOSIS — Z131 Encounter for screening for diabetes mellitus: Secondary | ICD-10-CM

## 2020-04-23 DIAGNOSIS — O09529 Supervision of elderly multigravida, unspecified trimester: Secondary | ICD-10-CM

## 2020-04-23 DIAGNOSIS — Z3480 Encounter for supervision of other normal pregnancy, unspecified trimester: Secondary | ICD-10-CM

## 2020-04-23 DIAGNOSIS — Z3A26 26 weeks gestation of pregnancy: Secondary | ICD-10-CM

## 2020-04-23 DIAGNOSIS — O10919 Unspecified pre-existing hypertension complicating pregnancy, unspecified trimester: Secondary | ICD-10-CM

## 2020-04-23 NOTE — Patient Instructions (Signed)
Baxter Hire, I greatly value your feedback.  If you receive a survey following your visit with [place] today, we appreciate you taking the time to fill it out.  Thanks, Philipp Deputy, CNM  [facility] at [facility] ([address] [place], [place] [address]) Entrance C, located off of [address] valet parking  Go to [email or web address] to register for FREE online childbirth classes  [place] Pediatricians/Family Doctors:  [facility] [phone]            [facility] [phone]                 [facility], [facility] Medicine [phone] (usually not accepting new patients unless you have family there already, you are always welcome to call and ask)       [facility] Department [phone]       [facility] Pediatricians/Family Doctors:   [facility]: [phone]  [facility]: [phone]  [facility]: [phone]  [facility] Doctors:   [facility]: [phone]   [facility]: [phone]  [facility] Doctors:  [facility]: [phone]    Home Blood Pressure Monitoring for Patients   Your provider has recommended that you check your blood pressure (BP) at least once a week at home. If you do not have a blood pressure cuff at home, one will be provided for you. Contact your provider if you have not received your monitor within 1 week.   Helpful Tips for Accurate Home Blood Pressure Checks  . Don't smoke, exercise, or drink caffeine 30 minutes before checking your BP . Use the restroom before checking your BP (a full bladder can raise your pressure) . Relax in a comfortable upright chair . Feet on the ground . Left arm resting comfortably on a flat surface at the level of your heart . Legs uncrossed . Back supported . Sit quietly and don't talk . Place the cuff on your bare arm . Adjust snuggly, so that only  two fingertips can fit between your skin and the top of the cuff . Check 2 readings separated by at least one minute . Keep a log of your BP readings . For a visual, please reference this diagram: http://ccnc.care/bpdiagram  Provider Name: [facility]     Phone: [phone]  Zone 1: ALL CLEAR  Continue to monitor your symptoms:  . BP reading is less than 140 (top number) or less than 90 (bottom number)  . No right upper stomach pain . No headaches or seeing spots . No feeling nauseated or throwing up . No swelling in face and hands  Zone 2: CAUTION Call your doctor's office for any of the following:  . BP reading is greater than 140 (top number) or greater than 90 (bottom number)  . Stomach pain under your ribs in the middle or right side . Headaches or seeing spots . Feeling nauseated or throwing up . Swelling in face and hands  Zone 3: EMERGENCY  Seek immediate medical care if you have any of the following:  . BP reading is greater than160 (top number) or greater than 110 (bottom number) . Severe headaches not improving with Tylenol . Serious difficulty catching your breath . Any worsening symptoms from Zone 2     Second Trimester of Pregnancy The second trimester is from week 14 through week 27 (months 4 through 6). The second trimester is often a time when you feel your best.  Your body has adjusted to being pregnant, and you begin to feel better physically. Usually, morning sickness has lessened or quit completely, you may have more energy, and you may have an increase in appetite. The second trimester is also a time when the fetus is growing rapidly. At the end of the sixth month, the fetus is about 9 inches long and weighs about 1 pounds. You will likely begin to feel the baby move (quickening) between 16 and 20 weeks of pregnancy. Body changes during your second trimester Your body continues to go through many changes during your second trimester. The changes vary  from woman to woman.  Your weight will continue to increase. You will notice your lower abdomen bulging out.  You may begin to get stretch marks on your hips, abdomen, and breasts.  You may develop headaches that can be relieved by medicines. The medicines should be approved by your health care provider.  You may urinate more often because the fetus is pressing on your bladder.  You may develop or continue to have heartburn as a result of your pregnancy.  You may develop constipation because certain hormones are causing the muscles that push waste through your intestines to slow down.  You may develop hemorrhoids or swollen, bulging veins (varicose veins).  You may have back pain. This is caused by: ? Weight gain. ? Pregnancy hormones that are relaxing the joints in your pelvis. ? A shift in weight and the muscles that support your balance.  Your breasts will continue to grow and they will continue to become tender.  Your gums may bleed and may be sensitive to brushing and flossing.  Dark spots or blotches (chloasma, mask of pregnancy) may develop on your face. This will likely fade after the baby is born.  A dark line from your belly button to the pubic area (linea nigra) may appear. This will likely fade after the baby is born.  You may have changes in your hair. These can include thickening of your hair, rapid growth, and changes in texture. Some women also have hair loss during or after pregnancy, or hair that feels dry or thin. Your hair will most likely return to normal after your baby is born.  What to expect at prenatal visits During a routine prenatal visit:  You will be weighed to make sure you and the fetus are growing normally.  Your blood pressure will be taken.  Your abdomen will be measured to track your baby's growth.  The fetal heartbeat will be listened to.  Any test results from the previous visit will be discussed.  Your health care provider may ask  you:  How you are feeling.  If you are feeling the baby move.  If you have had any abnormal symptoms, such as leaking fluid, bleeding, severe headaches, or abdominal cramping.  If you are using any tobacco products, including cigarettes, chewing tobacco, and electronic cigarettes.  If you have any questions.  Other tests that may be performed during your second trimester include:  Blood tests that check for: ? Low iron levels (anemia). ? High blood sugar that affects pregnant women (gestational diabetes) between 14 and 28 weeks. ? Rh antibodies. This is to check for a protein on red blood cells (Rh factor).  Urine tests to check for infections, diabetes, or protein in the urine.  An ultrasound to confirm the proper growth and development of the baby.  An amniocentesis to check for possible genetic problems.  Fetal screens  for spina bifida and Down syndrome.  HIV (human immunodeficiency virus) testing. Routine prenatal testing includes screening for HIV, unless you choose not to have this test.  Follow these instructions at home: Medicines  Follow your health care provider's instructions regarding medicine use. Specific medicines may be either safe or unsafe to take during pregnancy.  Take a prenatal vitamin that contains at least 600 micrograms (mcg) of folic acid.  If you develop constipation, try taking a stool softener if your health care provider approves. Eating and drinking  Eat a balanced diet that includes fresh fruits and vegetables, whole grains, good sources of protein such as meat, eggs, or tofu, and low-fat dairy. Your health care provider will help you determine the amount of weight gain that is right for you.  Avoid raw meat and uncooked cheese. These carry germs that can cause birth defects in the baby.  If you have low calcium intake from food, talk to your health care provider about whether you should take a daily calcium supplement.  Limit foods that  are high in fat and processed sugars, such as fried and sweet foods.  To prevent constipation: ? Drink enough fluid to keep your urine clear or pale yellow. ? Eat foods that are high in fiber, such as fresh fruits and vegetables, whole grains, and beans. Activity  Exercise only as directed by your health care provider. Most women can continue their usual exercise routine during pregnancy. Try to exercise for 30 minutes at least 5 days a week. Stop exercising if you experience uterine contractions.  Avoid heavy lifting, wear low heel shoes, and practice good posture.  A sexual relationship may be continued unless your health care provider directs you otherwise. Relieving pain and discomfort  Wear a good support bra to prevent discomfort from breast tenderness.  Take warm sitz baths to soothe any pain or discomfort caused by hemorrhoids. Use hemorrhoid cream if your health care provider approves.  Rest with your legs elevated if you have leg cramps or low back pain.  If you develop varicose veins, wear support hose. Elevate your feet for 15 minutes, 3-4 times a day. Limit salt in your diet. Prenatal Care  Write down your questions. Take them to your prenatal visits.  Keep all your prenatal visits as told by your health care provider. This is important. Safety  Wear your seat belt at all times when driving.  Make a list of emergency phone numbers, including numbers for family, friends, the hospital, and police and fire departments. General instructions  Ask your health care provider for a referral to a local prenatal education class. Begin classes no later than the beginning of month 6 of your pregnancy.  Ask for help if you have counseling or nutritional needs during pregnancy. Your health care provider can offer advice or refer you to specialists for help with various needs.  Do not use hot tubs, steam rooms, or saunas.  Do not douche or use tampons or scented sanitary  pads.  Do not cross your legs for long periods of time.  Avoid cat litter boxes and soil used by cats. These carry germs that can cause birth defects in the baby and possibly loss of the fetus by miscarriage or stillbirth.  Avoid all smoking, herbs, alcohol, and unprescribed drugs. Chemicals in these products can affect the formation and growth of the baby.  Do not use any products that contain nicotine or tobacco, such as cigarettes and e-cigarettes. If you need help quitting,  ask your health care provider.  Visit your dentist if you have not gone yet during your pregnancy. Use a soft toothbrush to brush your teeth and be gentle when you floss. Contact a health care provider if:  You have dizziness.  You have mild pelvic cramps, pelvic pressure, or nagging pain in the abdominal area.  You have persistent nausea, vomiting, or diarrhea.  You have a bad smelling vaginal discharge.  You have pain when you urinate. Get help right away if:  You have a fever.  You are leaking fluid from your vagina.  You have spotting or bleeding from your vagina.  You have severe abdominal cramping or pain.  You have rapid weight gain or weight loss.  You have shortness of breath with chest pain.  You notice sudden or extreme swelling of your face, hands, ankles, feet, or legs.  You have not felt your baby move in over an hour.  You have severe headaches that do not go away when you take medicine.  You have vision changes. Summary  The second trimester is from week 14 through week 27 (months 4 through 6). It is also a time when the fetus is growing rapidly.  Your body goes through many changes during pregnancy. The changes vary from woman to woman.  Avoid all smoking, herbs, alcohol, and unprescribed drugs. These chemicals affect the formation and growth your baby.  Do not use any tobacco products, such as cigarettes, chewing tobacco, and e-cigarettes. If you need help quitting, ask your  health care provider.  Contact your health care provider if you have any questions. Keep all prenatal visits as told by your health care provider. This is important. This information is not intended to replace advice given to you by your health care provider. Make sure you discuss any questions you have with your health care provider. Document Released: 12/29/2000 Document Revised: 06/12/2015 Document Reviewed: 03/07/2012 [employer] Interactive Patient Education  2017 [employer].

## 2020-04-23 NOTE — Progress Notes (Signed)
   HIGH-RISK PREGNANCY VISIT Patient name: Michelle Pace MRN [id number]  Date of birth: 07/25/1980 Chief Complaint:   Routine Prenatal Visit (PN2 today)  History of Present Illness:   Michelle Pace is a 40 y.o. [id number] female at [redacted]w[redacted]d with an Estimated Date of Delivery: 07/25/20 being seen today for ongoing management of a high-risk pregnancy complicated by chronic hypertension currently on Labetalol 200mg  bid.  Today she reports doing well; had an episode of feeling flushed/vomiting the first time she took Labetalol but it hasn't happened since.  Depression screen [facility] 2/9 04/23/2020 01/16/2020 10/31/2019  Decreased Interest 0 0 0  Down, Depressed, Hopeless 0 0 2  PHQ - 2 Score 0 0 2  Altered sleeping 1 1 0  Tired, decreased energy 1 1 0  Change in appetite 0 0 0  Feeling bad or failure about yourself  0 0 0  Trouble concentrating 0 0 0  Moving slowly or fidgety/restless 0 0 0  Suicidal thoughts 0 0 0  PHQ-9 Score 2 2 2     Contractions: Not present. Vag. Bleeding: None.  Movement: Present. denies leaking of fluid.  Review of Systems:   Pertinent items are noted in HPI Denies abnormal vaginal discharge w/ itching/odor/irritation, headaches, visual changes, shortness of breath, chest pain, abdominal pain, severe nausea/vomiting, or problems with urination or bowel movements unless otherwise stated above. Pertinent History Reviewed:  Reviewed past medical,surgical, social, obstetrical and family history.  Reviewed problem list, medications and allergies. Physical Assessment:   Vitals:   04/23/20 0850  BP: 126/85  Pulse: 93  Weight: 179 lb (81.2 kg)  Body mass index is 34.96 kg/m.           Physical Examination:   General appearance: alert, well appearing, and in no distress  Mental status: alert, oriented to person, place, and time  Skin: warm & dry   Extremities: Edema: None    Cardiovascular: normal heart rate noted  Respiratory: normal respiratory effort, no  distress  Abdomen: gravid, soft, non-tender  Pelvic: Cervical exam deferred         Fetal Status: Fetal Heart Rate (bpm): 141 Fundal Height: 27 cm Movement: Present    Fetal Surveillance Testing today: doppler    No results found for this or any previous visit (from the past 24 hour(s)).  Assessment & Plan:  High-risk pregnancy: at [redacted]w[redacted]d with an Estimated Date of Delivery: 07/25/20   1) cHTN, stable on Labetalol 200mg  bid; taking bASA 162mg  qd; will get growth scan with next visit  2) Desires ppBTL, sign papers at next visit   Meds: No orders of the defined types were placed in this encounter.   Labs/procedures today: PN2  Treatment Plan:  Growth next visit; 2x/wk testing starting at 32wks  Reviewed: Preterm labor symptoms and general obstetric precautions including but not limited to vaginal bleeding, contractions, leaking of fluid and fetal movement were reviewed in detail with the patient.  All questions were answered. Does have home bp cuff. Office bp cuff given: not applicable. Check bp weekly, let [redacted]w[redacted]d know if consistently >140 and/or >90.  Follow-up: Return in about 3 weeks (around 05/14/2020) for HROB, : EFW, in person.   Future Appointments  Date Time Provider Department Center  04/23/2020 10:10 AM [place], CNM [facility]    Orders Placed This Encounter  Procedures  . 05/16/2020 OB Follow Up   US [facility] 04/23/2020 9:20 AM

## 2020-04-24 LAB — CBC
Hematocrit: 33.2 % — ABNORMAL LOW (ref 34.0–46.6)
Hemoglobin: 11.2 g/dL (ref 11.1–15.9)
MCH: 30.4 pg (ref 26.6–33.0)
MCHC: 33.7 g/dL (ref 31.5–35.7)
MCV: 90 fL (ref 79–97)
Platelets: 243 10*3/uL (ref 150–450)
RBC: 3.69 x10E6/uL — ABNORMAL LOW (ref 3.77–5.28)
RDW: 13 % (ref 11.7–15.4)
WBC: 12.1 10*3/uL — ABNORMAL HIGH (ref 3.4–10.8)

## 2020-04-24 LAB — GLUCOSE TOLERANCE, 2 HOURS W/ 1HR
Glucose, 1 hour: 67 mg/dL (ref 65–179)
Glucose, 2 hour: 84 mg/dL (ref 65–152)
Glucose, Fasting: 74 mg/dL (ref 65–91)

## 2020-04-24 LAB — ANTIBODY SCREEN: Antibody Screen: NEGATIVE

## 2020-04-24 LAB — RPR: RPR Ser Ql: NONREACTIVE

## 2020-04-24 LAB — HIV ANTIBODY (ROUTINE TESTING W REFLEX): HIV Screen 4th Generation wRfx: NONREACTIVE

## 2020-05-15 ENCOUNTER — Ambulatory Visit (INDEPENDENT_AMBULATORY_CARE_PROVIDER_SITE_OTHER): Payer: Medicaid Other

## 2020-05-15 ENCOUNTER — Other Ambulatory Visit: Payer: Self-pay

## 2020-05-15 ENCOUNTER — Ambulatory Visit: Payer: Medicaid Other | Admitting: Obstetrics & Gynecology

## 2020-05-15 ENCOUNTER — Encounter: Payer: Self-pay | Admitting: Obstetrics & Gynecology

## 2020-05-15 VITALS — BP 113/79 | HR 91 | Wt 178.0 lb

## 2020-05-15 DIAGNOSIS — F410 Panic disorder [episodic paroxysmal anxiety] without agoraphobia: Secondary | ICD-10-CM

## 2020-05-15 DIAGNOSIS — O0993 Supervision of high risk pregnancy, unspecified, third trimester: Secondary | ICD-10-CM

## 2020-05-15 DIAGNOSIS — Z3A29 29 weeks gestation of pregnancy: Secondary | ICD-10-CM

## 2020-05-15 DIAGNOSIS — O10919 Unspecified pre-existing hypertension complicating pregnancy, unspecified trimester: Secondary | ICD-10-CM

## 2020-05-15 DIAGNOSIS — O09529 Supervision of elderly multigravida, unspecified trimester: Secondary | ICD-10-CM

## 2020-05-15 DIAGNOSIS — O09522 Supervision of elderly multigravida, second trimester: Secondary | ICD-10-CM

## 2020-05-15 DIAGNOSIS — O2341 Unspecified infection of urinary tract in pregnancy, first trimester: Secondary | ICD-10-CM

## 2020-05-15 DIAGNOSIS — Z2839 Other underimmunization status: Secondary | ICD-10-CM

## 2020-05-15 DIAGNOSIS — Z1389 Encounter for screening for other disorder: Secondary | ICD-10-CM

## 2020-05-15 LAB — POCT URINALYSIS DIPSTICK OB
Blood, UA: NEGATIVE
Glucose, UA: NEGATIVE
Ketones, UA: NEGATIVE
Leukocytes, UA: NEGATIVE
Nitrite, UA: NEGATIVE

## 2020-05-15 NOTE — Progress Notes (Signed)
[place] 29+6 wks,cephalic,anterior placenta gr 1,cx 3.5 cm,afi 18 cm,EFW 1403 g 26%,FL 8%

## 2020-05-15 NOTE — Progress Notes (Unsigned)
HIGH-RISK PREGNANCY VISIT Patient name: Michelle Pace MRN [id number]  Date of birth: [birth year] Chief Complaint:   Routine Prenatal Visit, High Risk Gestation, and Pregnancy Ultrasound  History of Present Illness:   Michelle Pace is a 39 y.o. [id number] female at [redacted]w[redacted]d with an Estimated Date of Delivery: 07/25/20 being seen today for ongoing management of a high-risk pregnancy complicated by {High Risk OB:23190}.  Today she reports {sx:14538}.  Depression screen [facility] 2/9 04/23/2020 01/16/2020 10/31/2019  Decreased Interest 0 0 0  Down, Depressed, Hopeless 0 0 2  PHQ - 2 Score 0 0 2  Altered sleeping 1 1 0  Tired, decreased energy 1 1 0  Change in appetite 0 0 0  Feeling bad or failure about yourself  0 0 0  Trouble concentrating 0 0 0  Moving slowly or fidgety/restless 0 0 0  Suicidal thoughts 0 0 0  PHQ-9 Score 2 2 2     Contractions: Not present. Vag. Bleeding: None.  Movement: Present. {Actions; denies-reports:120008} leaking of fluid.  Review of Systems:   Pertinent items are noted in HPI Denies abnormal vaginal discharge w/ itching/odor/irritation, headaches, visual changes, shortness of breath, chest pain, abdominal pain, severe nausea/vomiting, or problems with urination or bowel movements unless otherwise stated above. Pertinent History Reviewed:  Reviewed past medical,surgical, social, obstetrical and family history.  Reviewed problem list, medications and allergies. Physical Assessment:   Vitals:   05/15/20 1451  BP: 113/79  Pulse: 91  Weight: 178 lb (80.7 kg)  Body mass index is 34.76 kg/m.           Physical Examination:   General appearance: {appearance:315021::"alert, well appearing, and in no distress"}  Mental status: {pe mental status_general use:313008::"alert, oriented to person, place, and time"}  Skin: warm & dry   Extremities: Edema: None    Cardiovascular: normal heart rate noted  Respiratory: normal respiratory effort, no distress  Abdomen: gravid,  soft, non-tender  Pelvic: {Blank single:19197::"Cervical exam performed","Cervical exam deferred"}         Fetal Status:     Movement: Present    Fetal Surveillance Testing today: ***   Chaperone: {Chaperone:19197::"N/A","Latisha Cresenzo","Janet Young","Amanda Andrews","Peggy Dones","Nicole Jones","Angel Neas"}    Results for orders placed or performed in visit on 05/15/20 (from the past 24 hour(s))  POC Urinalysis Dipstick OB   Collection Time: 05/15/20  2:49 PM  Result Value Ref Range   Color, UA     Clarity, UA     Glucose, UA Negative Negative   Bilirubin, UA     Ketones, UA neg    Spec Grav, UA     Blood, UA neg    pH, UA     POC,PROTEIN,UA Trace Negative, Trace, Small (1+), Moderate (2+), Large (3+), 4+   Urobilinogen, UA     Nitrite, UA neg    Leukocytes, UA Negative Negative   Appearance     Odor      Assessment & Plan:  High-risk pregnancy: 05/17/20 at [redacted]w[redacted]d with an Estimated Date of Delivery: 07/25/20   1) ***, {stable/unstable:60080}  2) ***, {stable/unstable:60080}  Meds: No orders of the defined types were placed in this encounter.   Labs/procedures today: {ob lab/procedures:25214}  Treatment Plan:  ***  Reviewed: {Blank single:19197::"Term","Preterm"} labor symptoms and general obstetric precautions including but not limited to vaginal bleeding, contractions, leaking of fluid and fetal movement were reviewed in detail with the patient.  All questions were answered. {does does not:25387::"Does"} have home bp cuff. Office bp cuff given: {yes/no/default  n/a:21102::"not applicable"}. Check bp {weekly daily:25388::"weekly"}, let us know if consistently {pregnant bp:25389::">140 and/or >90"}.  Follow-up: Return in about 2 weeks (around 05/29/2020) for NST, Nurse only.   No future appointments.  Orders Placed This Encounter  Procedures  . POC Urinalysis Dipstick OB   Lazaro Arms  05/15/2020 3:57 PM

## 2020-05-29 ENCOUNTER — Ambulatory Visit (INDEPENDENT_AMBULATORY_CARE_PROVIDER_SITE_OTHER): Payer: Medicaid Other | Admitting: *Deleted

## 2020-05-29 ENCOUNTER — Other Ambulatory Visit: Payer: Self-pay

## 2020-05-29 VITALS — BP 134/89 | HR 97 | Wt 179.0 lb

## 2020-05-29 DIAGNOSIS — O2341 Unspecified infection of urinary tract in pregnancy, first trimester: Secondary | ICD-10-CM

## 2020-05-29 DIAGNOSIS — O09899 Supervision of other high risk pregnancies, unspecified trimester: Secondary | ICD-10-CM

## 2020-05-29 DIAGNOSIS — Z1389 Encounter for screening for other disorder: Secondary | ICD-10-CM | POA: Diagnosis not present

## 2020-05-29 DIAGNOSIS — Z2839 Other underimmunization status: Secondary | ICD-10-CM

## 2020-05-29 DIAGNOSIS — F410 Panic disorder [episodic paroxysmal anxiety] without agoraphobia: Secondary | ICD-10-CM

## 2020-05-29 DIAGNOSIS — Z331 Pregnant state, incidental: Secondary | ICD-10-CM

## 2020-05-29 DIAGNOSIS — O09523 Supervision of elderly multigravida, third trimester: Secondary | ICD-10-CM

## 2020-05-29 DIAGNOSIS — O09529 Supervision of elderly multigravida, unspecified trimester: Secondary | ICD-10-CM | POA: Diagnosis not present

## 2020-05-29 DIAGNOSIS — O10919 Unspecified pre-existing hypertension complicating pregnancy, unspecified trimester: Secondary | ICD-10-CM

## 2020-05-29 LAB — POCT URINALYSIS DIPSTICK OB
Blood, UA: NEGATIVE
Glucose, UA: NEGATIVE
Ketones, UA: NEGATIVE
Leukocytes, UA: NEGATIVE
Nitrite, UA: NEGATIVE

## 2020-05-29 NOTE — Progress Notes (Addendum)
   NURSE VISIT- NST  SUBJECTIVE:  Michelle Pace is a 40 y.o. [phone] female at [redacted]w[redacted]d, here for a NST for pregnancy complicated by [facility].  She reports active fetal movement, contractions: none, vaginal bleeding: none, membranes: intact.   OBJECTIVE:  BP 134/89   Pulse 97   Wt 179 lb (81.2 kg)   LMP 10/19/2019   BMI 34.96 kg/m   Appears well, no apparent distress  Results for orders placed or performed in visit on 05/29/20 (from the past 24 hour(s))  POC Urinalysis Dipstick OB   Collection Time: 05/29/20 11:48 AM  Result Value Ref Range   Color, UA     Clarity, UA     Glucose, UA Negative Negative   Bilirubin, UA     Ketones, UA neg    Spec Grav, UA     Blood, UA neg    pH, UA     POC,PROTEIN,UA Trace Negative, Trace, Small (1+), Moderate (2+), Large (3+), 4+   Urobilinogen, UA     Nitrite, UA neg    Leukocytes, UA Negative Negative   Appearance     Odor      NST: FHR baseline 130 bpm, Variability: moderate, Accelerations:present, Decelerations:  Absent= Cat 1/reactive Toco: none   ASSESSMENT: [id number] at [redacted]w[redacted]d with CHTN NST reactive  PLAN: EFM strip reviewed by Dr. Despina Hidden   Recommendations: keep next appointment as scheduled    Jobe Marker  05/29/2020 12:06 PM   Attestation of Attending Supervision of Nursing Visit Encounter: Evaluation and management procedures were performed by the nursing staff under my supervision and collaboration.  I have reviewed the nurse's note and chart, and I agree with the management and plan.  Rockne Coons MD Attending Physician for the Center for [facility] 06/05/2020 10:36 AM

## 2020-06-02 ENCOUNTER — Other Ambulatory Visit: Payer: Self-pay

## 2020-06-02 ENCOUNTER — Ambulatory Visit (INDEPENDENT_AMBULATORY_CARE_PROVIDER_SITE_OTHER): Payer: Medicaid Other | Admitting: Women's Health

## 2020-06-02 ENCOUNTER — Encounter: Payer: Self-pay | Admitting: Women's Health

## 2020-06-02 VITALS — BP 133/91 | HR 90 | Wt 178.0 lb

## 2020-06-02 DIAGNOSIS — O09523 Supervision of elderly multigravida, third trimester: Secondary | ICD-10-CM

## 2020-06-02 DIAGNOSIS — O0993 Supervision of high risk pregnancy, unspecified, third trimester: Secondary | ICD-10-CM | POA: Diagnosis not present

## 2020-06-02 DIAGNOSIS — O2341 Unspecified infection of urinary tract in pregnancy, first trimester: Secondary | ICD-10-CM

## 2020-06-02 DIAGNOSIS — O09529 Supervision of elderly multigravida, unspecified trimester: Secondary | ICD-10-CM

## 2020-06-02 DIAGNOSIS — Z23 Encounter for immunization: Secondary | ICD-10-CM | POA: Diagnosis not present

## 2020-06-02 DIAGNOSIS — Z3A32 32 weeks gestation of pregnancy: Secondary | ICD-10-CM

## 2020-06-02 DIAGNOSIS — Z2839 Other underimmunization status: Secondary | ICD-10-CM

## 2020-06-02 DIAGNOSIS — F410 Panic disorder [episodic paroxysmal anxiety] without agoraphobia: Secondary | ICD-10-CM

## 2020-06-02 DIAGNOSIS — Z3490 Encounter for supervision of normal pregnancy, unspecified, unspecified trimester: Secondary | ICD-10-CM

## 2020-06-02 DIAGNOSIS — O10919 Unspecified pre-existing hypertension complicating pregnancy, unspecified trimester: Secondary | ICD-10-CM

## 2020-06-02 LAB — POCT URINALYSIS DIPSTICK OB
Blood, UA: NEGATIVE
Glucose, UA: NEGATIVE
Ketones, UA: NEGATIVE
Leukocytes, UA: NEGATIVE
Nitrite, UA: NEGATIVE
POC,PROTEIN,UA: NEGATIVE

## 2020-06-02 NOTE — Progress Notes (Signed)
HIGH-RISK PREGNANCY VISIT Patient name: Michelle Pace MRN [id number]  Date of birth: 1980/08/03 Chief Complaint:   High Risk Gestation (NST)  History of Present Illness:   Michelle Pace is a 40 y.o. [phone] female at [redacted]w[redacted]d with an Estimated Date of Delivery: 07/25/20 being seen today for ongoing management of a high-risk pregnancy complicated by chronic hypertension currently on Labetalol 200mg  BID.  Today she reports no complaints. Wants BTL Depression screen [facility] 2/9 04/23/2020 01/16/2020 10/31/2019  Decreased Interest 0 0 0  Down, Depressed, Hopeless 0 0 2  PHQ - 2 Score 0 0 2  Altered sleeping 1 1 0  Tired, decreased energy 1 1 0  Change in appetite 0 0 0  Feeling bad or failure about yourself  0 0 0  Trouble concentrating 0 0 0  Moving slowly or fidgety/restless 0 0 0  Suicidal thoughts 0 0 0  PHQ-9 Score 2 2 2     Contractions: Not present. Vag. Bleeding: None.  Movement: Present. denies leaking of fluid.  Review of Systems:   Pertinent items are noted in HPI Denies abnormal vaginal discharge w/ itching/odor/irritation, headaches, visual changes, shortness of breath, chest pain, abdominal pain, severe nausea/vomiting, or problems with urination or bowel movements unless otherwise stated above. Pertinent History Reviewed:  Reviewed past medical,surgical, social, obstetrical and family history.  Reviewed problem list, medications and allergies. Physical Assessment:   Vitals:   06/02/20 1024  BP: (!) 133/91  Pulse: 90  Weight: 178 lb (80.7 kg)  Body mass index is 34.76 kg/m.           Physical Examination:   General appearance: alert, well appearing, and in no distress  Mental status: alert, oriented to person, place, and time  Skin: warm & dry   Extremities: Edema: None    Cardiovascular: normal heart rate noted  Respiratory: normal respiratory effort, no distress  Abdomen: gravid, soft, non-tender  Pelvic: Cervical exam deferred         Fetal Status: Fetal  Heart Rate (bpm): 125 Fundal Height: 30 cm Movement: Present    Fetal Surveillance Testing today: NST: FHR baseline 125 bpm, Variability: moderate, Accelerations:present, Decelerations:  Absent= Cat 1/reactive Toco: irregular, not felt by pt     Chaperone: N/A    Results for orders placed or performed in visit on 06/02/20 (from the past 24 hour(s))  POC Urinalysis Dipstick OB   Collection Time: 06/02/20 10:31 AM  Result Value Ref Range   Color, UA     Clarity, UA     Glucose, UA Negative Negative   Bilirubin, UA     Ketones, UA neg    Spec Grav, UA     Blood, UA neg    pH, UA     POC,PROTEIN,UA Negative Negative, Trace, Small (1+), Moderate (2+), Large (3+), 4+   Urobilinogen, UA     Nitrite, UA neg    Leukocytes, UA Negative Negative   Appearance     Odor      Assessment & Plan:  High-risk pregnancy: 06/04/20 at [redacted]w[redacted]d with an Estimated Date of Delivery: 07/25/20   1) CHTN, stable on Labetalol 200mg  BID, reviewed pre-e s/s, reasons to seek care  2) Wants BTL>reviewed risks/benefits, discussed high incidence regret <30yo if appropriate, LARCs just as effective, consent signed today    Meds: No orders of the defined types were placed in this encounter.   Labs/procedures today: tdap and NST  Treatment Plan:  Growth u/s q4wks    2x/wk  testing nst/sono     Deliver 38-39wks (37wks or prn if poor control)____   Reviewed: Preterm labor symptoms and general obstetric precautions including but not limited to vaginal bleeding, contractions, leaking of fluid and fetal movement were reviewed in detail with the patient.  All questions were answered. Does have home bp cuff. Office bp cuff given: not applicable. Check bp daily, let us know if consistently >150 and/or >95.  Follow-up: Return for As scheduled; needs HROB w/ MD/CNm next Tue w/ bpp/dopp (or NST if no u/s), Sign BTL consent today.   Future Appointments  Date Time Provider Department Center  06/05/2020 10:10 AM [facility]  NURSE [facility]  06/13/2020 10:50 AM [facility] NURSE [facility]  06/17/2020  3:00 PM [facility] [place] [facility] None  06/17/2020  3:50 PM Lazaro Arms, MD [facility]  06/20/2020 10:30 AM [facility] NURSE [facility]  06/24/2020  2:30 PM [facility] [place] [facility] None  06/24/2020  3:30 PM Myna Hidalgo, DO [facility]  06/27/2020 10:30 AM [facility] NURSE [facility]  07/01/2020  2:30 PM [facility] [place] [facility] None  07/01/2020  3:30 PM Cheral Marker, CNM [facility]  07/04/2020 10:50 AM [facility] NURSE [facility]  07/08/2020  2:30 PM [facility] [place] [facility] None  07/08/2020  3:30 PM Myna Hidalgo, DO [facility]  07/11/2020 10:50 AM [facility] NURSE [facility]  07/15/2020  2:30 PM [facility] [place] [facility] None  07/15/2020  3:30 PM Lazaro Arms, MD [facility]  07/18/2020 10:30 AM [facility] NURSE [facility]    Orders Placed This Encounter  Procedures  . Tdap vaccine greater than or equal to 7yo IM  . POC Urinalysis Dipstick OB   Cheral Marker CNM, [facility] 06/02/2020 11:25 AM

## 2020-06-02 NOTE — Patient Instructions (Signed)
Michelle Pace, I greatly value your feedback.  If you receive a survey following your visit with us today, we appreciate you taking the time to fill it out.  Thanks, Michelle Pace Michelle Pace, CNM, WHNP-BC  [facility] ([address] [place], [place] [address]) Entrance C, located off of [address] Free 24/7 valet parking   Go to [email or web address] to register for FREE online childbirth classes    Call the office ([phone]) or go to [facility] if:  You begin to have strong, frequent contractions  Your water breaks.  Sometimes it is a big gush of fluid, sometimes it is just a trickle that keeps getting your panties wet or running down your legs  You have vaginal bleeding.  It is normal to have a small amount of spotting if your cervix was checked.   You don't feel your baby moving like normal.  If you don't, get you something to eat and drink and lay down and focus on feeling your baby move.  You should feel at least 10 movements in 2 hours.  If you don't, you should call the office or go to [facility].   Call the office ([phone]) or go to [facility] for these signs of pre-eclampsia:  Severe headache that does not go away with Tylenol  Visual changes- seeing spots, double, blurred vision  Pain under your right breast or upper abdomen that does not go away with Tums or heartburn medicine  Nausea and/or vomiting  Severe swelling in your hands, feet, and face    Home Blood Pressure Monitoring for Patients   Your provider has recommended that you check your blood pressure (BP) at least once a week at home. If you do not have a blood pressure cuff at home, one will be provided for you. Contact your provider if you have not received your monitor within 1 week.   Helpful Tips for Accurate Home Blood Pressure Checks  . Don't smoke, exercise, or drink caffeine 30 minutes before checking your BP . Use the restroom before checking your BP (a full  bladder can raise your pressure) . Relax in a comfortable upright chair . Feet on the ground . Left arm resting comfortably on a flat surface at the level of your heart . Legs uncrossed . Back supported . Sit quietly and don't talk . Place the cuff on your bare arm . Adjust snuggly, so that only two fingertips can fit between your skin and the top of the cuff . Check 2 readings separated by at least one minute . Keep a log of your BP readings . For a visual, please reference this diagram: http://ccnc.care/bpdiagram  Provider Name: [facility]     Phone: [phone]  Zone 1: ALL CLEAR  Continue to monitor your symptoms:  . BP reading is less than 140 (top number) or less than 90 (bottom number)  . No right upper stomach pain . No headaches or seeing spots . No feeling nauseated or throwing up . No swelling in face and hands  Zone 2: CAUTION Call your doctor's office for any of the following:  . BP reading is greater than 140 (top number) or greater than 90 (bottom number)  . Stomach pain under your ribs in the middle or right side . Headaches or seeing spots . Feeling nauseated or throwing up . Swelling in face and hands  Zone 3: EMERGENCY  Seek immediate medical care if you have any of   the following:  . BP reading is greater than160 (top number) or greater than 110 (bottom number) . Severe headaches not improving with Tylenol . Serious difficulty catching your breath . Any worsening symptoms from Zone 2  Preterm Labor and Birth Information  The normal length of a pregnancy is 39-41 weeks. Preterm labor is when labor starts before 37 completed weeks of pregnancy. What are the risk factors for preterm labor? Preterm labor is more likely to occur in women who:  Have certain infections during pregnancy such as a bladder infection, sexually transmitted infection, or infection inside the uterus (chorioamnionitis).  Have a shorter-than-normal cervix.  Have gone into  preterm labor before.  Have had surgery on their cervix.  Are younger than age 54 or older than age 25.  Are African American.  Are pregnant with twins or multiple babies (multiple gestation).  Take street drugs or smoke while pregnant.  Do not gain enough weight while pregnant.  Became pregnant shortly after having been pregnant. What are the symptoms of preterm labor? Symptoms of preterm labor include:  Cramps similar to those that can happen during a menstrual period. The cramps may happen with diarrhea.  Pain in the abdomen or lower back.  Regular uterine contractions that may feel like tightening of the abdomen.  A feeling of increased pressure in the pelvis.  Increased watery or bloody mucus discharge from the vagina.  Water breaking (ruptured amniotic sac). Why is it important to recognize signs of preterm labor? It is important to recognize signs of preterm labor because babies who are born prematurely may not be fully developed. This can put them at an increased risk for:  Long-term (chronic) heart and lung problems.  Difficulty immediately after birth with regulating body systems, including blood sugar, body temperature, heart rate, and breathing rate.  Bleeding in the brain.  Cerebral palsy.  Learning difficulties.  Death. These risks are highest for babies who are born before 48 weeks of pregnancy. How is preterm labor treated? Treatment depends on the length of your pregnancy, your condition, and the health of your baby. It may involve: 1. Having a stitch (suture) placed in your cervix to prevent your cervix from opening too early (cerclage). 2. Taking or being given medicines, such as: ? Hormone medicines. These may be given early in pregnancy to help support the pregnancy. ? Medicine to stop contractions. ? Medicines to help mature the baby's lungs. These may be prescribed if the risk of delivery is high. ? Medicines to prevent your baby from  developing cerebral palsy. If the labor happens before 34 weeks of pregnancy, you may need to stay in the hospital. What should I do if I think I am in preterm labor? If you think that you are going into preterm labor, call your health care provider right away. How can I prevent preterm labor in future pregnancies? To increase your chance of having a full-term pregnancy:  Do not use any tobacco products, such as cigarettes, chewing tobacco, and e-cigarettes. If you need help quitting, ask your health care provider.  Do not use street drugs or medicines that have not been prescribed to you during your pregnancy.  Talk with your health care provider before taking any herbal supplements, even if you have been taking them regularly.  Make sure you gain a healthy amount of weight during your pregnancy.  Watch for infection. If you think that you might have an infection, get it checked right away.  Make sure to  tell your health care provider if you have gone into preterm labor before. This information is not intended to replace advice given to you by your health care provider. Make sure you discuss any questions you have with your health care provider. Document Revised: 04/28/2018 Document Reviewed: 05/28/2015 Elsevier Patient Education  [place].

## 2020-06-05 ENCOUNTER — Ambulatory Visit (INDEPENDENT_AMBULATORY_CARE_PROVIDER_SITE_OTHER): Payer: Medicaid Other | Admitting: *Deleted

## 2020-06-05 ENCOUNTER — Other Ambulatory Visit: Payer: Self-pay

## 2020-06-05 VITALS — BP 130/89 | HR 97 | Wt 177.8 lb

## 2020-06-05 DIAGNOSIS — Z1389 Encounter for screening for other disorder: Secondary | ICD-10-CM

## 2020-06-05 DIAGNOSIS — O09899 Supervision of other high risk pregnancies, unspecified trimester: Secondary | ICD-10-CM

## 2020-06-05 DIAGNOSIS — O288 Other abnormal findings on antenatal screening of mother: Secondary | ICD-10-CM

## 2020-06-05 DIAGNOSIS — O2341 Unspecified infection of urinary tract in pregnancy, first trimester: Secondary | ICD-10-CM

## 2020-06-05 DIAGNOSIS — Z331 Pregnant state, incidental: Secondary | ICD-10-CM | POA: Diagnosis not present

## 2020-06-05 DIAGNOSIS — O09523 Supervision of elderly multigravida, third trimester: Secondary | ICD-10-CM

## 2020-06-05 DIAGNOSIS — F410 Panic disorder [episodic paroxysmal anxiety] without agoraphobia: Secondary | ICD-10-CM

## 2020-06-05 DIAGNOSIS — O10919 Unspecified pre-existing hypertension complicating pregnancy, unspecified trimester: Secondary | ICD-10-CM | POA: Diagnosis not present

## 2020-06-05 DIAGNOSIS — O09529 Supervision of elderly multigravida, unspecified trimester: Secondary | ICD-10-CM

## 2020-06-05 DIAGNOSIS — Z2839 Other underimmunization status: Secondary | ICD-10-CM

## 2020-06-05 LAB — POCT URINALYSIS DIPSTICK OB
Blood, UA: NEGATIVE
Glucose, UA: NEGATIVE
Leukocytes, UA: NEGATIVE
Nitrite, UA: NEGATIVE

## 2020-06-05 NOTE — Progress Notes (Addendum)
   NURSE VISIT- NST  SUBJECTIVE:  Michelle Pace is a 40 y.o. [phone] female at [redacted]w[redacted]d, here for a NST for pregnancy complicated by [facility].  She reports active fetal movement, contractions: none, vaginal bleeding: none, membranes: intact.   OBJECTIVE:  BP 130/89   Pulse 97   Wt 177 lb 12.8 oz (80.6 kg)   LMP 10/19/2019   BMI 34.72 kg/m   Appears well, no apparent distress  Results for orders placed or performed in visit on 06/05/20 (from the past 24 hour(s))  POC Urinalysis Dipstick OB   Collection Time: 06/05/20 10:45 AM  Result Value Ref Range   Color, UA     Clarity, UA     Glucose, UA Negative Negative   Bilirubin, UA     Ketones, UA small    Spec Grav, UA     Blood, UA neg    pH, UA     POC,PROTEIN,UA Trace Negative, Trace, Small (1+), Moderate (2+), Large (3+), 4+   Urobilinogen, UA     Nitrite, UA neg    Leukocytes, UA Negative Negative   Appearance     Odor      NST: FHR baseline 135 bpm, Variability: moderate, Accelerations:present, Decelerations:  Absent= Cat 1/reactive Toco: none   ASSESSMENT: [id number] at [redacted]w[redacted]d with CHTN NST reactive  PLAN: EFM strip reviewed by Dr. Despina Hidden   Recommendations: keep next appointment as scheduled    Jobe Marker  06/05/2020 11:13 AM   Attestation of Attending Supervision of Nursing Visit Encounter: Evaluation and management procedures were performed by the nursing staff under my supervision and collaboration.  I have reviewed the nurse's note and chart, and I agree with the management and plan.  Rockne Coons MD Attending Physician for the Center for [facility] 06/05/2020 11:18 AM

## 2020-06-10 ENCOUNTER — Ambulatory Visit (INDEPENDENT_AMBULATORY_CARE_PROVIDER_SITE_OTHER): Payer: Medicaid Other | Admitting: Women's Health

## 2020-06-10 ENCOUNTER — Encounter: Payer: Self-pay | Admitting: Women's Health

## 2020-06-10 ENCOUNTER — Other Ambulatory Visit: Payer: Self-pay

## 2020-06-10 VITALS — BP 136/88 | HR 90

## 2020-06-10 DIAGNOSIS — O0993 Supervision of high risk pregnancy, unspecified, third trimester: Secondary | ICD-10-CM

## 2020-06-10 DIAGNOSIS — O10919 Unspecified pre-existing hypertension complicating pregnancy, unspecified trimester: Secondary | ICD-10-CM

## 2020-06-10 DIAGNOSIS — O09523 Supervision of elderly multigravida, third trimester: Secondary | ICD-10-CM

## 2020-06-10 DIAGNOSIS — O09529 Supervision of elderly multigravida, unspecified trimester: Secondary | ICD-10-CM

## 2020-06-10 LAB — POCT URINALYSIS DIPSTICK OB
Blood, UA: NEGATIVE
Glucose, UA: NEGATIVE
Ketones, UA: NEGATIVE
Leukocytes, UA: NEGATIVE
Nitrite, UA: NEGATIVE

## 2020-06-10 NOTE — Patient Instructions (Signed)
Baxter Hire, I greatly value your feedback.  If you receive a survey following your visit with [place] today, we appreciate you taking the time to fill it out.  Thanks, Joellyn Haff, CNM, WHNP-BC  [facility] ([address] [place], [place] [address]) Entrance C, located off of [address] valet parking   Go to [email or web address] to register for FREE online childbirth classes    Call the office [phone]) or go to [facility] if:  You begin to have strong, frequent contractions  Your water breaks.  Sometimes it is a big gush of fluid, sometimes it is just a trickle that keeps getting your panties wet or running down your legs  You have vaginal bleeding.  It is normal to have a small amount of spotting if your cervix was checked.   You don't feel your baby moving like normal.  If you don't, get you something to eat and drink and lay down and focus on feeling your baby move.  You should feel at least 10 movements in 2 hours.  If you don't, you should call the office or go to [facility].   Call the office [phone]) or go to [facility] hospital for these signs of pre-eclampsia:  Severe headache that does not go away with Tylenol  Visual changes- seeing spots, double, blurred vision  Pain under your right breast or upper abdomen that does not go away with Tums or heartburn medicine  Nausea and/or vomiting  Severe swelling in your hands, feet, and face    Home Blood Pressure Monitoring for Patients   Your provider has recommended that you check your blood pressure (BP) at least once a week at home. If you do not have a blood pressure cuff at home, one will be provided for you. Contact your provider if you have not received your monitor within 1 week.   Helpful Tips for Accurate Home Blood Pressure Checks  . Don't smoke, exercise, or drink caffeine 30 minutes before checking your BP . Use the restroom before checking your BP (a full  bladder can raise your pressure) . Relax in a comfortable upright chair . Feet on the ground . Left arm resting comfortably on a flat surface at the level of your heart . Legs uncrossed . Back supported . Sit quietly and don't talk . Place the cuff on your bare arm . Adjust snuggly, so that only two fingertips can fit between your skin and the top of the cuff . Check 2 readings separated by at least one minute . Keep a log of your BP readings . For a visual, please reference this diagram: http://ccnc.care/bpdiagram  Provider Name: [facility]     Phone: [phone]  Zone 1: ALL CLEAR  Continue to monitor your symptoms:  . BP reading is less than 140 (top number) or less than 90 (bottom number)  . No right upper stomach pain . No headaches or seeing spots . No feeling nauseated or throwing up . No swelling in face and hands  Zone 2: CAUTION Call your doctor's office for any of the following:  . BP reading is greater than 140 (top number) or greater than 90 (bottom number)  . Stomach pain under your ribs in the middle or right side . Headaches or seeing spots . Feeling nauseated or throwing up . Swelling in face and hands  Zone 3: EMERGENCY  Seek immediate medical care if you have any of  the following:  . BP reading is greater than160 (top number) or greater than 110 (bottom number) . Severe headaches not improving with Tylenol . Serious difficulty catching your breath . Any worsening symptoms from Zone 2  Preterm Labor and Birth Information  The normal length of a pregnancy is 39-41 weeks. Preterm labor is when labor starts before 37 completed weeks of pregnancy. What are the risk factors for preterm labor? Preterm labor is more likely to occur in women who:  Have certain infections during pregnancy such as a bladder infection, sexually transmitted infection, or infection inside the uterus (chorioamnionitis).  Have a shorter-than-normal cervix.  Have gone into  preterm labor before.  Have had surgery on their cervix.  Are younger than age 54 or older than age 25.  Are African American.  Are pregnant with twins or multiple babies (multiple gestation).  Take street drugs or smoke while pregnant.  Do not gain enough weight while pregnant.  Became pregnant shortly after having been pregnant. What are the symptoms of preterm labor? Symptoms of preterm labor include:  Cramps similar to those that can happen during a menstrual period. The cramps may happen with diarrhea.  Pain in the abdomen or lower back.  Regular uterine contractions that may feel like tightening of the abdomen.  A feeling of increased pressure in the pelvis.  Increased watery or bloody mucus discharge from the vagina.  Water breaking (ruptured amniotic sac). Why is it important to recognize signs of preterm labor? It is important to recognize signs of preterm labor because babies who are born prematurely may not be fully developed. This can put them at an increased risk for:  Long-term (chronic) heart and lung problems.  Difficulty immediately after birth with regulating body systems, including blood sugar, body temperature, heart rate, and breathing rate.  Bleeding in the brain.  Cerebral palsy.  Learning difficulties.  Death. These risks are highest for babies who are born before 48 weeks of pregnancy. How is preterm labor treated? Treatment depends on the length of your pregnancy, your condition, and the health of your baby. It may involve: 1. Having a stitch (suture) placed in your cervix to prevent your cervix from opening too early (cerclage). 2. Taking or being given medicines, such as: ? Hormone medicines. These may be given early in pregnancy to help support the pregnancy. ? Medicine to stop contractions. ? Medicines to help mature the baby's lungs. These may be prescribed if the risk of delivery is high. ? Medicines to prevent your baby from  developing cerebral palsy. If the labor happens before 34 weeks of pregnancy, you may need to stay in the hospital. What should I do if I think I am in preterm labor? If you think that you are going into preterm labor, call your health care provider right away. How can I prevent preterm labor in future pregnancies? To increase your chance of having a full-term pregnancy:  Do not use any tobacco products, such as cigarettes, chewing tobacco, and e-cigarettes. If you need help quitting, ask your health care provider.  Do not use street drugs or medicines that have not been prescribed to you during your pregnancy.  Talk with your health care provider before taking any herbal supplements, even if you have been taking them regularly.  Make sure you gain a healthy amount of weight during your pregnancy.  Watch for infection. If you think that you might have an infection, get it checked right away.  Make sure to  tell your health care provider if you have gone into preterm labor before. This information is not intended to replace advice given to you by your health care provider. Make sure you discuss any questions you have with your health care provider. Document Revised: 04/28/2018 Document Reviewed: 05/28/2015 Elsevier Patient Education  [place].

## 2020-06-10 NOTE — Progress Notes (Signed)
HIGH-RISK PREGNANCY VISIT Patient name: Michelle Pace MRN [id number]  Date of birth: 03-27-1980 Chief Complaint:   Routine Prenatal Visit (NST)  History of Present Illness:   Michelle Pace is a 40 y.o. [id number] female at [redacted]w[redacted]d with an Estimated Date of Delivery: 07/25/20 being seen today for ongoing management of a high-risk pregnancy complicated by chronic hypertension currently on Labetalol 200mg  BID.  Today she reports no complaints.  Depression screen [facility] 2/9 04/23/2020 01/16/2020 10/31/2019  Decreased Interest 0 0 0  Down, Depressed, Hopeless 0 0 2  PHQ - 2 Score 0 0 2  Altered sleeping 1 1 0  Tired, decreased energy 1 1 0  Change in appetite 0 0 0  Feeling bad or failure about yourself  0 0 0  Trouble concentrating 0 0 0  Moving slowly or fidgety/restless 0 0 0  Suicidal thoughts 0 0 0  PHQ-9 Score 2 2 2     Contractions: Irritability. Vag. Bleeding: None.  Movement: Present. denies leaking of fluid.  Review of Systems:   Pertinent items are noted in HPI Denies abnormal vaginal discharge w/ itching/odor/irritation, headaches, visual changes, shortness of breath, chest pain, abdominal pain, severe nausea/vomiting, or problems with urination or bowel movements unless otherwise stated above. Pertinent History Reviewed:  Reviewed past medical,surgical, social, obstetrical and family history.  Reviewed problem list, medications and allergies. Physical Assessment:   Vitals:   06/10/20 1002  BP: 136/88  Pulse: 90  There is no height or weight on file to calculate BMI.           Physical Examination:   General appearance: alert, well appearing, and in no distress  Mental status: alert, oriented to person, place, and time  Skin: warm & dry   Extremities: Edema: None    Cardiovascular: normal heart rate noted  Respiratory: normal respiratory effort, no distress  Abdomen: gravid, soft, non-tender  Pelvic: Cervical exam deferred         Fetal Status: Fetal Heart Rate (bpm):  135   Movement: Present    Fetal Surveillance Testing today: NST: FHR baseline 135 bpm, Variability: moderate, Accelerations:present, Decelerations:  Absent= Cat 1/reactive Toco: none     Chaperone: N/A    Results for orders placed or performed in visit on 06/10/20 (from the past 24 hour(s))  POC Urinalysis Dipstick OB   Collection Time: 06/10/20 10:06 AM  Result Value Ref Range   Color, UA     Clarity, UA     Glucose, UA Negative Negative   Bilirubin, UA     Ketones, UA neg    Spec Grav, UA     Blood, UA neg    pH, UA     POC,PROTEIN,UA Trace Negative, Trace, Small (1+), Moderate (2+), Large (3+), 4+   Urobilinogen, UA     Nitrite, UA neg    Leukocytes, UA Negative Negative   Appearance     Odor      Assessment & Plan:  High-risk pregnancy: 06/12/20 at [redacted]w[redacted]d with an Estimated Date of Delivery: 07/25/20   1) CHTN, stable on Labetalol 200mg  BID, ASA  Meds: No orders of the defined types were placed in this encounter.   Labs/procedures today: NST  Treatment Plan:  Growth u/s q 4wks    2x/wk testing nst/sono @ 32wks     Deliver 38-39wks (37wks or prn if poor control)____   Reviewed: Preterm labor symptoms and general obstetric precautions including but not limited to vaginal bleeding, contractions, leaking of fluid and  fetal movement were reviewed in detail with the patient.  All questions were answered. Does have home bp cuff. Office bp cuff given: not applicable. Check bp weekly, let us know if consistently >150 and/or >95.  Follow-up: Return for As scheduled.   Future Appointments  Date Time Provider Department Center  06/13/2020 10:50 AM [facility] NURSE [facility]  06/17/2020  3:00 PM [facility] [place] [facility] None  06/17/2020  3:50 PM Lazaro Arms, MD [facility]  06/20/2020 10:30 AM [facility] NURSE [facility]  06/24/2020  2:30 PM [facility] [place] [facility] None  06/24/2020  3:30 PM Myna Hidalgo, DO [facility]  06/27/2020 10:30 AM [facility] NURSE  [facility]  07/01/2020  2:30 PM [facility] [place] [facility] None  07/01/2020  3:30 PM Cheral Marker, CNM [facility]  07/04/2020 10:50 AM [facility] NURSE [facility]  07/08/2020  2:30 PM [facility] [place] [facility] None  07/08/2020  3:30 PM Myna Hidalgo, DO [facility]  07/11/2020 10:50 AM [facility] NURSE [facility]  07/15/2020  2:30 PM [facility] [place] [facility] None  07/15/2020  3:30 PM Lazaro Arms, MD [facility]  07/18/2020 10:30 AM [facility] NURSE [facility]    Orders Placed This Encounter  Procedures  . US FETAL BPP WO NON STRESS  . [place] UA Cord Doppler  . US OB Follow Up  . POC Urinalysis Dipstick OB   Cheral Marker CNM, [facility] 06/10/2020 10:28 AM

## 2020-06-13 ENCOUNTER — Ambulatory Visit (INDEPENDENT_AMBULATORY_CARE_PROVIDER_SITE_OTHER): Payer: Medicaid Other | Admitting: *Deleted

## 2020-06-13 ENCOUNTER — Other Ambulatory Visit: Payer: Self-pay

## 2020-06-13 DIAGNOSIS — O09529 Supervision of elderly multigravida, unspecified trimester: Secondary | ICD-10-CM

## 2020-06-13 DIAGNOSIS — O10919 Unspecified pre-existing hypertension complicating pregnancy, unspecified trimester: Secondary | ICD-10-CM | POA: Diagnosis not present

## 2020-06-13 NOTE — Progress Notes (Addendum)
   NURSE VISIT- NST  SUBJECTIVE:  Michelle Pace is a 40 y.o. [phone] female at [redacted]w[redacted]d, here for a NST for pregnancy complicated by [facility].  She reports active fetal movement, contractions: none, vaginal bleeding: none, membranes: intact.   OBJECTIVE:  BP 120/81   Pulse 97   Wt 178 lb 6.4 oz (80.9 kg)   LMP 10/19/2019   BMI 34.84 kg/m   Appears well, no apparent distress  No results found for this or any previous visit (from the past 24 hour(s)).  NST: FHR baseline 130 bpm, Variability: moderate, Accelerations:present, Decelerations:  Absent= Cat 1/reactive Toco: none   ASSESSMENT: [id number] at [redacted]w[redacted]d with CHTN NST reactive  PLAN: EFM strip reviewed by Joellyn Haff, CNM, [facility]   Recommendations: keep next appointment as scheduled    Jobe Marker  06/13/2020 12:34 PM   Chart reviewed for nurse visit. Agree with plan of care.  Cheral Marker, [place] 06/13/2020 2:17 PM

## 2020-06-17 ENCOUNTER — Ambulatory Visit (INDEPENDENT_AMBULATORY_CARE_PROVIDER_SITE_OTHER): Payer: Medicaid Other

## 2020-06-17 ENCOUNTER — Other Ambulatory Visit: Payer: Self-pay

## 2020-06-17 ENCOUNTER — Encounter: Payer: Self-pay | Admitting: Obstetrics & Gynecology

## 2020-06-17 ENCOUNTER — Ambulatory Visit (INDEPENDENT_AMBULATORY_CARE_PROVIDER_SITE_OTHER): Payer: Medicaid Other | Admitting: Obstetrics & Gynecology

## 2020-06-17 VITALS — BP 124/84 | HR 88 | Wt 180.0 lb

## 2020-06-17 DIAGNOSIS — Z3A34 34 weeks gestation of pregnancy: Secondary | ICD-10-CM

## 2020-06-17 DIAGNOSIS — O09529 Supervision of elderly multigravida, unspecified trimester: Secondary | ICD-10-CM

## 2020-06-17 DIAGNOSIS — Z2839 Other underimmunization status: Secondary | ICD-10-CM

## 2020-06-17 DIAGNOSIS — O10919 Unspecified pre-existing hypertension complicating pregnancy, unspecified trimester: Secondary | ICD-10-CM | POA: Diagnosis not present

## 2020-06-17 DIAGNOSIS — O0993 Supervision of high risk pregnancy, unspecified, third trimester: Secondary | ICD-10-CM

## 2020-06-17 DIAGNOSIS — F410 Panic disorder [episodic paroxysmal anxiety] without agoraphobia: Secondary | ICD-10-CM

## 2020-06-17 DIAGNOSIS — O2341 Unspecified infection of urinary tract in pregnancy, first trimester: Secondary | ICD-10-CM

## 2020-06-17 LAB — POCT URINALYSIS DIPSTICK OB
Blood, UA: NEGATIVE
Glucose, UA: NEGATIVE
Ketones, UA: NEGATIVE
Leukocytes, UA: NEGATIVE
Nitrite, UA: NEGATIVE

## 2020-06-17 NOTE — Progress Notes (Signed)
[place] 34+4 wks,cephalic,BPP 8/8,RI .56,.58,.52=31%,anterior placenta gr 3,afi 26 cm,polyhydramnios,EFW 2340 g 31%,fhr 142 bpm

## 2020-06-17 NOTE — Progress Notes (Signed)
HIGH-RISK PREGNANCY VISIT Patient name: SELENI MELLER MRN [id number]  Date of birth: 01/26/1980 Chief Complaint:   High Risk Gestation ([place] today)  History of Present Illness:   RUARI DUGGAN is a 40 y.o. [phone] female at [redacted]w[redacted]d with an Estimated Date of Delivery: 07/25/20 being seen today for ongoing management of a high-risk pregnancy complicated by [facility] on labetalol 200 BID.    Today she reports no complaints. Contractions: Not present. Vag. Bleeding: None.  Movement: Present. denies leaking of fluid.   Depression screen [facility] 2/9 04/23/2020 01/16/2020 10/31/2019  Decreased Interest 0 0 0  Down, Depressed, Hopeless 0 0 2  PHQ - 2 Score 0 0 2  Altered sleeping 1 1 0  Tired, decreased energy 1 1 0  Change in appetite 0 0 0  Feeling bad or failure about yourself  0 0 0  Trouble concentrating 0 0 0  Moving slowly or fidgety/restless 0 0 0  Suicidal thoughts 0 0 0  PHQ-9 Score 2 2 2      GAD 7 : Generalized Anxiety Score 04/23/2020 01/16/2020 10/31/2019  Nervous, Anxious, on Edge 2 1 1   Control/stop worrying 2 0 1  Worry too much - different things 0 0 1  Trouble relaxing 0 1 0  Restless 0 0 0  Easily annoyed or irritable 2 1 1   Afraid - awful might happen 0 0 0  Total GAD 7 Score 6 3 4      Review of Systems:   Pertinent items are noted in HPI Denies abnormal vaginal discharge w/ itching/odor/irritation, headaches, visual changes, shortness of breath, chest pain, abdominal pain, severe nausea/vomiting, or problems with urination or bowel movements unless otherwise stated above. Pertinent History Reviewed:  Reviewed past medical,surgical, social, obstetrical and family history.  Reviewed problem list, medications and allergies. Physical Assessment:   Vitals:   06/17/20 1618  BP: 124/84  Pulse: 88  Weight: 180 lb (81.6 kg)  Body mass index is 35.15 kg/m.           Physical Examination:   General appearance: alert, well appearing, and in no distress  Mental status:  alert, oriented to person, place, and time  Skin: warm & dry   Extremities: Edema: None    Cardiovascular: normal heart rate noted  Respiratory: normal respiratory effort, no distress  Abdomen: gravid, soft, non-tender  Pelvic: Cervical exam deferred         Fetal Status:     Movement: Present    Fetal Surveillance Testing today: BPP 8/8 with normal Dopplers   Chaperone: N/A    Results for orders placed or performed in visit on 06/17/20 (from the past 24 hour(s))  POC Urinalysis Dipstick OB   Collection Time: 06/17/20  4:21 PM  Result Value Ref Range   Color, UA     Clarity, UA     Glucose, UA Negative Negative   Bilirubin, UA     Ketones, UA neg    Spec Grav, UA     Blood, UA neg    pH, UA     POC,PROTEIN,UA Small (1+) Negative, Trace, Small (1+), Moderate (2+), Large (3+), 4+   Urobilinogen, UA     Nitrite, UA neg    Leukocytes, UA Negative Negative   Appearance     Odor      Assessment & Plan:  High-risk pregnancy: 06/19/20 at [redacted]w[redacted]d with an Estimated Date of Delivery: 07/25/20   1) CHTN, stable, on labetalol 200 BID normal surveillance  2) Mild  polyhydramniso,   Meds: No orders of the defined types were placed in this encounter.   Labs/procedures today: U/S  Treatment Plan:  As scheduled  Reviewed: Preterm labor symptoms and general obstetric precautions including but not limited to vaginal bleeding, contractions, leaking of fluid and fetal movement were reviewed in detail with the patient.  All questions were answered. Does have home bp cuff. Office bp cuff given: not applicable. Check bp daily, let us know if consistently >140 and/or >90.  Follow-up: No follow-ups on file.   Future Appointments  Date Time Provider Department Center  06/20/2020 10:30 AM [facility] NURSE [facility]  06/24/2020  2:30 PM [facility] - [facility] [place] [facility] None  06/24/2020  3:30 PM Myna Hidalgo, DO [facility]  06/27/2020 10:30 AM [facility] NURSE [facility]  07/01/2020  2:30 PM  [facility] - [facility] [place] [facility] None  07/01/2020  3:30 PM Cheral Marker, CNM [facility]  07/04/2020 10:50 AM [facility] NURSE [facility]  07/08/2020  2:30 PM [facility] - [facility] [place] [facility] None  07/08/2020  3:30 PM Myna Hidalgo, DO [facility]  07/11/2020 10:50 AM [facility] NURSE [facility]  07/15/2020  2:30 PM [facility] [place] [facility] None  07/15/2020  3:30 PM Lazaro Arms, MD [facility]  07/18/2020 10:30 AM [facility] NURSE [facility]    Orders Placed This Encounter  Procedures  . POC Urinalysis Dipstick OB   Lazaro Arms  06/17/2020 4:55 PM

## 2020-06-20 ENCOUNTER — Other Ambulatory Visit: Payer: Medicaid Other

## 2020-06-24 ENCOUNTER — Ambulatory Visit (INDEPENDENT_AMBULATORY_CARE_PROVIDER_SITE_OTHER): Payer: Medicaid Other | Admitting: Obstetrics & Gynecology

## 2020-06-24 ENCOUNTER — Ambulatory Visit (INDEPENDENT_AMBULATORY_CARE_PROVIDER_SITE_OTHER): Payer: Medicaid Other

## 2020-06-24 ENCOUNTER — Other Ambulatory Visit: Payer: Self-pay

## 2020-06-24 VITALS — BP 121/79 | HR 97 | Wt 176.8 lb

## 2020-06-24 DIAGNOSIS — O10919 Unspecified pre-existing hypertension complicating pregnancy, unspecified trimester: Secondary | ICD-10-CM

## 2020-06-24 DIAGNOSIS — Z2839 Other underimmunization status: Secondary | ICD-10-CM

## 2020-06-24 DIAGNOSIS — O09523 Supervision of elderly multigravida, third trimester: Secondary | ICD-10-CM

## 2020-06-24 DIAGNOSIS — O2341 Unspecified infection of urinary tract in pregnancy, first trimester: Secondary | ICD-10-CM

## 2020-06-24 DIAGNOSIS — Z3A35 35 weeks gestation of pregnancy: Secondary | ICD-10-CM

## 2020-06-24 DIAGNOSIS — Z1389 Encounter for screening for other disorder: Secondary | ICD-10-CM

## 2020-06-24 DIAGNOSIS — O0993 Supervision of high risk pregnancy, unspecified, third trimester: Secondary | ICD-10-CM

## 2020-06-24 DIAGNOSIS — O09529 Supervision of elderly multigravida, unspecified trimester: Secondary | ICD-10-CM

## 2020-06-24 DIAGNOSIS — F410 Panic disorder [episodic paroxysmal anxiety] without agoraphobia: Secondary | ICD-10-CM

## 2020-06-24 LAB — POCT URINALYSIS DIPSTICK OB
Blood, UA: NEGATIVE
Glucose, UA: NEGATIVE
Ketones, UA: NEGATIVE
Leukocytes, UA: NEGATIVE
Nitrite, UA: NEGATIVE

## 2020-06-24 NOTE — Progress Notes (Signed)
HIGH-RISK PREGNANCY VISIT Patient name: Michelle Pace MRN [id number]  Date of birth: 30-Oct-1980 Chief Complaint:   Routine Prenatal Visit, Pregnancy Ultrasound, and High Risk Gestation  History of Present Illness:   Michelle Pace is a 40 y.o. [id number] female at [redacted]w[redacted]d with an Estimated Date of Delivery: 07/25/20 being seen today for ongoing management of a high-risk pregnancy complicated by advanced maternal age and  -chronic hypertension currently on Labetalol 200mg  bid.    Today she reports no complaints.   Contractions: Not present. Vag. Bleeding: None.  Movement: Present. denies leaking of fluid.   Depression screen [facility] 2/9 04/23/2020 01/16/2020 10/31/2019  Decreased Interest 0 0 0  Down, Depressed, Hopeless 0 0 2  PHQ - 2 Score 0 0 2  Altered sleeping 1 1 0  Tired, decreased energy 1 1 0  Change in appetite 0 0 0  Feeling bad or failure about yourself  0 0 0  Trouble concentrating 0 0 0  Moving slowly or fidgety/restless 0 0 0  Suicidal thoughts 0 0 0  PHQ-9 Score 2 2 2      Current Outpatient Medications  Medication Instructions  . aspirin 162 mg, Oral, Daily  . Blood Pressure Monitor MISC For regular home bp monitoring during pregnancy  . escitalopram (LEXAPRO) 20 MG tablet TAKE 1 TABLET BY MOUTH ONCE DAILY  . labetalol (NORMODYNE) 200 mg, Oral, 2 times daily  . prenatal vitamin w/FE, FA (PRENATAL 1 + 1) 27-1 MG TABS tablet 1 tablet, Oral, Daily     Review of Systems:   Pertinent items are noted in HPI Denies abnormal vaginal discharge w/ itching/odor/irritation, headaches, visual changes, shortness of breath, chest pain, abdominal pain, severe nausea/vomiting, or problems with urination or bowel movements unless otherwise stated above. Pertinent History Reviewed:  Reviewed past medical,surgical, social, obstetrical and family history.  Reviewed problem list, medications and allergies. Physical Assessment:   Vitals:   06/24/20 1529  BP: 121/79  Pulse: 97   Weight: 176 lb 12.8 oz (80.2 kg)  Body mass index is 34.53 kg/m.           Physical Examination:   General appearance: alert, well appearing, and in no distress  Mental status: alert, oriented to person, place, and time  Skin: warm & dry   Extremities: Edema: None    Cardiovascular: normal heart rate noted  Respiratory: normal respiratory effort, no distress  Abdomen: gravid, soft, non-tender  Pelvic: Cervical exam deferred         Fetal Status: Fetal Heart Rate (bpm): 133 by   Movement: Present    Fetal Surveillance Testing today: BPP/doppler cephalic,anterior placenta gr 3,AFI 20 cm,FHR 133 bpm RI .59,.58,.56=46%,BPP 8/8,EFW 22%,FL .52%   Chaperone: N/A    Results for orders placed or performed in visit on 06/24/20 (from the past 24 hour(s))  POC Urinalysis Dipstick OB   Collection Time: 06/24/20  3:33 PM  Result Value Ref Range   Color, UA     Clarity, UA     Glucose, UA Negative Negative   Bilirubin, UA     Ketones, UA neg    Spec Grav, UA     Blood, UA neg    pH, UA     POC,PROTEIN,UA Trace Negative, Trace, Small (1+), Moderate (2+), Large (3+), 4+   Urobilinogen, UA     Nitrite, UA neg    Leukocytes, UA Negative Negative   Appearance     Odor       Assessment &  Plan:  High-risk pregnancy: [id number] at [redacted]w[redacted]d with an Estimated Date of Delivery: 07/25/20   1) chronic HTN Stable with current medication Antepartum testing twice weekly Schedule IOL 38-39wks  2) Anxiety- doing well on Lexapro  []  GBS next visit  Meds: No orders of the defined types were placed in this encounter.   Labs/procedures today: BPP/doppler  Treatment Plan:  Continue with care as outlined above  Reviewed: Term labor symptoms and general obstetric precautions including but not limited to vaginal bleeding, contractions, leaking of fluid and fetal movement were reviewed in detail with the patient.  All questions were answered. Pt has home bp cuff. Check bp weekly, let know if  >140/90.   Follow-up: Return in about 1 week (around 07/01/2020) for HROB visit, continue antepartum testing.   Future Appointments  Date Time Provider Department Center  06/27/2020 10:30 AM [facility] NURSE [facility]  07/01/2020  2:30 PM [facility] - [facility] 07/03/2020 [facility] None  07/01/2020  3:30 PM 07/03/2020, CNM [facility]  07/04/2020 10:50 AM [facility] NURSE [facility]  07/08/2020  2:30 PM [facility] - [facility] 07/10/2020 [facility] None  07/08/2020  3:30 PM 07/10/2020, DO [facility]  07/11/2020 10:50 AM [facility] NURSE [facility]  07/15/2020  2:30 PM [facility] - [facility] 07/17/2020 [facility] None  07/15/2020  3:30 PM 07/17/2020, MD [facility]  07/18/2020 10:30 AM [facility] NURSE [facility]    Orders Placed This Encounter  Procedures  . POC Urinalysis Dipstick OB    09/18/2020, DO Attending Obstetrician & Gynecologist, [facility] for [facility]

## 2020-06-24 NOTE — Progress Notes (Signed)
[place] 35+4 wks,cephalic,anterior placenta gr 3,AFI 20 cm,FHR 133 bpm,RI .59,.58,.56=46%,BPP 8/8,EFW 22%,FL .52%

## 2020-06-27 ENCOUNTER — Other Ambulatory Visit: Payer: Self-pay

## 2020-06-27 ENCOUNTER — Ambulatory Visit (INDEPENDENT_AMBULATORY_CARE_PROVIDER_SITE_OTHER): Payer: Medicaid Other | Admitting: *Deleted

## 2020-06-27 VITALS — BP 118/73 | HR 98 | Wt 178.0 lb

## 2020-06-27 DIAGNOSIS — O09529 Supervision of elderly multigravida, unspecified trimester: Secondary | ICD-10-CM

## 2020-06-27 DIAGNOSIS — O10919 Unspecified pre-existing hypertension complicating pregnancy, unspecified trimester: Secondary | ICD-10-CM | POA: Diagnosis not present

## 2020-06-27 LAB — POCT URINALYSIS DIPSTICK OB
Blood, UA: NEGATIVE
Glucose, UA: NEGATIVE
Ketones, UA: NEGATIVE
Leukocytes, UA: NEGATIVE
Nitrite, UA: NEGATIVE

## 2020-06-27 NOTE — Progress Notes (Signed)
   NURSE VISIT- NST  SUBJECTIVE:  Michelle Pace is a 40 y.o. [phone] female at [redacted]w[redacted]d, here for a NST for pregnancy complicated by [facility].  She reports active fetal movement, contractions: none, vaginal bleeding: none, membranes: intact.   OBJECTIVE:  BP 118/73   Pulse 98   Wt 178 lb (80.7 kg)   LMP 10/19/2019   BMI 34.76 kg/m   Appears well, no apparent distress  No results found for this or any previous visit (from the past 24 hour(s)).  NST: FHR baseline 135 bpm, Variability: moderate, Accelerations:present, Decelerations:  Absent= Cat 1/reactive Toco: occasional   ASSESSMENT: [id number] at [redacted]w[redacted]d with CHTN NST reactive  PLAN: EFM strip reviewed by Michelle Pace, CNM, [facility]   Recommendations: keep next appointment as scheduled    Michelle Pace  06/27/2020 1:16 PM  Chart reviewed for nurse visit. Agree with plan of care.  Michelle Pace, [place] 06/27/2020 2:25 PM

## 2020-07-01 ENCOUNTER — Ambulatory Visit (INDEPENDENT_AMBULATORY_CARE_PROVIDER_SITE_OTHER): Payer: Medicaid Other | Admitting: Women's Health

## 2020-07-01 ENCOUNTER — Ambulatory Visit (INDEPENDENT_AMBULATORY_CARE_PROVIDER_SITE_OTHER): Payer: Medicaid Other

## 2020-07-01 ENCOUNTER — Other Ambulatory Visit (HOSPITAL_COMMUNITY)
Admission: RE | Admit: 2020-07-01 | Discharge: 2020-07-01 | Disposition: A | Discharge status: Home / Self Care | Payer: Medicaid Other | Source: Ambulatory Visit | Attending: Obstetrics & Gynecology | Admitting: Obstetrics & Gynecology

## 2020-07-01 ENCOUNTER — Encounter: Payer: Self-pay | Admitting: Women's Health

## 2020-07-01 ENCOUNTER — Other Ambulatory Visit: Payer: Self-pay

## 2020-07-01 VITALS — BP 130/80 | HR 93 | Wt 177.6 lb

## 2020-07-01 DIAGNOSIS — O09523 Supervision of elderly multigravida, third trimester: Secondary | ICD-10-CM

## 2020-07-01 DIAGNOSIS — O10919 Unspecified pre-existing hypertension complicating pregnancy, unspecified trimester: Secondary | ICD-10-CM

## 2020-07-01 DIAGNOSIS — O0993 Supervision of high risk pregnancy, unspecified, third trimester: Secondary | ICD-10-CM

## 2020-07-01 DIAGNOSIS — O09899 Supervision of other high risk pregnancies, unspecified trimester: Secondary | ICD-10-CM

## 2020-07-01 DIAGNOSIS — Z1389 Encounter for screening for other disorder: Secondary | ICD-10-CM | POA: Diagnosis not present

## 2020-07-01 DIAGNOSIS — Z3A36 36 weeks gestation of pregnancy: Secondary | ICD-10-CM | POA: Diagnosis not present

## 2020-07-01 DIAGNOSIS — O09529 Supervision of elderly multigravida, unspecified trimester: Secondary | ICD-10-CM

## 2020-07-01 DIAGNOSIS — O10913 Unspecified pre-existing hypertension complicating pregnancy, third trimester: Secondary | ICD-10-CM | POA: Insufficient documentation

## 2020-07-01 DIAGNOSIS — Z3490 Encounter for supervision of normal pregnancy, unspecified, unspecified trimester: Secondary | ICD-10-CM

## 2020-07-01 DIAGNOSIS — F410 Panic disorder [episodic paroxysmal anxiety] without agoraphobia: Secondary | ICD-10-CM

## 2020-07-01 DIAGNOSIS — O2341 Unspecified infection of urinary tract in pregnancy, first trimester: Secondary | ICD-10-CM

## 2020-07-01 LAB — POCT URINALYSIS DIPSTICK OB
Blood, UA: NEGATIVE
Glucose, UA: NEGATIVE
Ketones, UA: NEGATIVE
Leukocytes, UA: NEGATIVE
Nitrite, UA: NEGATIVE
POC,PROTEIN,UA: NEGATIVE

## 2020-07-01 NOTE — Progress Notes (Signed)
[place] 36+4 wks,cephalic,BPP 8/8,FHR 140 BPM,anterior placenta gr 3,AFI 21.4 cm,RI .50,.56,.47,.55=32%

## 2020-07-01 NOTE — Patient Instructions (Signed)
Michelle Pace, thank you for choosing our office today! We appreciate the opportunity to meet your healthcare needs. You may receive a short survey by mail, e-mail, or through MyChart. If you are happy with your care we would appreciate if you could take just a few minutes to complete the survey questions. We read all of your comments and take your feedback very seriously. Thank you again for choosing our office.  [facility] ([address] [place], [place] [address]) Entrance C, located off of [address] Free 24/7 valet parking   CLASSES: Go to [email or web address] to register for classes (childbirth, breastfeeding, waterbirth, infant CPR, daddy bootcamp, etc.)  Call the office ([phone]) or go to [facility] if: You begin to have strong, frequent contractions Your water breaks.  Sometimes it is a big gush of fluid, sometimes it is just a trickle that keeps getting your panties wet or running down your legs You have vaginal bleeding.  It is normal to have a small amount of spotting if your cervix was checked.  You don't feel your baby moving like normal.  If you don't, get you something to eat and drink and lay down and focus on feeling your baby move.   If your baby is still not moving like normal, you should call the office or go to [facility].  Call the office ([phone]) or go to [facility] for these signs of pre-eclampsia: Severe headache that does not go away with Tylenol Visual changes- seeing spots, double, blurred vision Pain under your right breast or upper abdomen that does not go away with Tums or heartburn medicine Nausea and/or vomiting Severe swelling in your hands, feet, and face   [place] [facility] ([facility]): [address], [phone]           [facility]: [address], [phone]                 [facility] ([facility]): [address], [phone] (call to ask if accepting patients) [facility]: [address], [place], [phone]    [place] [facility]/Family Doctors [facility] ([facility]): [address], [phone] [facility]: [address], [phone] [facility]: [address], [phone]  [place] [facility]  [facility] ([facility]): [phone] [facility]: [address], [phone]   [place] [facility]: [address], [phone]  [place] [facility]  [facility]: [phone], [phone]  Home Blood Pressure Monitoring for Patients   Your provider has recommended that you check your blood pressure (BP) at least once a week at home. If you do not have a blood pressure cuff at home, one will be provided for you. Contact your provider if you have not received your monitor within 1 week.   Helpful Tips for Accurate Home Blood Pressure Checks  Don't smoke, exercise, or drink caffeine 30 minutes before checking your BP Use the restroom before checking your BP (a full bladder can raise your pressure) Relax in a comfortable upright chair Feet on the ground Left arm resting comfortably on a flat surface at the level of your heart Legs uncrossed Back supported Sit quietly and don't talk Place the cuff on your bare arm Adjust snuggly, so that only two fingertips   can fit between your skin and the top of the cuff Check 2 readings separated by at least one minute Keep a log of your BP readings For a visual, please reference this diagram: http://ccnc.care/bpdiagram  Provider Name: [facility]     Phone: [phone]  Zone 1: ALL CLEAR  Continue to monitor your symptoms:  BP reading is less than 140 (top number) or less than 90 (bottom number)  No right  upper stomach pain No headaches or seeing spots No feeling nauseated or throwing up No swelling in face and hands  Zone 2: CAUTION Call your doctor's office for any of the following:  BP reading is greater than 140 (top number) or greater than 90 (bottom number)  Stomach pain under your ribs in the middle or right side Headaches or seeing spots Feeling nauseated or throwing up Swelling in face and hands  Zone 3: EMERGENCY  Seek immediate medical care if you have any of the following:  BP reading is greater than160 (top number) or greater than 110 (bottom number) Severe headaches not improving with Tylenol Serious difficulty catching your breath Any worsening symptoms from Zone 2   Braxton Hicks Contractions Contractions of the uterus can occur throughout pregnancy, but they are not always a sign that you are in labor. You may have practice contractions called Braxton Hicks contractions. These false labor contractions are sometimes confused with true labor. What are Braxton Hicks contractions? Braxton Hicks contractions are tightening movements that occur in the muscles of the uterus before labor. Unlike true labor contractions, these contractions do not result in opening (dilation) and thinning of the cervix. Toward the end of pregnancy (32-34 weeks), Braxton Hicks contractions can happen more often and may become stronger. These contractions are sometimes difficult to tell apart from true labor because they can be very uncomfortable. You should not feel embarrassed if you go to the hospital with false labor. Sometimes, the only way to tell if you are in true labor is for your health care provider to look for changes in the cervix. The health care provider will do a physical exam and may monitor your contractions. If you are not in true labor, the exam should show that your cervix is not dilating and your water has not broken. If there are no other health problems associated with your  pregnancy, it is completely safe for you to be sent home with false labor. You may continue to have Braxton Hicks contractions until you go into true labor. How to tell the difference between true labor and false labor True labor Contractions last 30-70 seconds. Contractions become very regular. Discomfort is usually felt in the top of the uterus, and it spreads to the lower abdomen and low back. Contractions do not go away with walking. Contractions usually become more intense and increase in frequency. The cervix dilates and gets thinner. False labor Contractions are usually shorter and not as strong as true labor contractions. Contractions are usually irregular. Contractions are often felt in the front of the lower abdomen and in the groin. Contractions may go away when you walk around or change positions while lying down. Contractions get weaker and are shorter-lasting as time goes on. The cervix usually does not dilate or become thin. Follow these instructions at home:  Take over-the-counter and prescription medicines only as told by your health care provider. Keep up with your usual exercises and follow other instructions from your health care provider. Eat and drink lightly if you think   you are going into labor. If Braxton Hicks contractions are making you uncomfortable: Change your position from lying down or resting to walking, or change from walking to resting. Sit and rest in a tub of warm water. Drink enough fluid to keep your urine pale yellow. Dehydration may cause these contractions. Do slow and deep breathing several times an hour. Keep all follow-up prenatal visits as told by your health care provider. This is important. Contact a health care provider if: You have a fever. You have continuous pain in your abdomen. Get help right away if: Your contractions become stronger, more regular, and closer together. You have fluid leaking or gushing from your vagina. You pass  blood-tinged mucus (bloody show). You have bleeding from your vagina. You have low back pain that you never had before. You feel your baby's head pushing down and causing pelvic pressure. Your baby is not moving inside you as much as it used to. Summary Contractions that occur before labor are called Braxton Hicks contractions, false labor, or practice contractions. Braxton Hicks contractions are usually shorter, weaker, farther apart, and less regular than true labor contractions. True labor contractions usually become progressively stronger and regular, and they become more frequent. Manage discomfort from Braxton Hicks contractions by changing position, resting in a warm bath, drinking plenty of water, or practicing deep breathing. This information is not intended to replace advice given to you by your health care provider. Make sure you discuss any questions you have with your health care provider. Document Revised: 12/17/2016 Document Reviewed: 05/20/2016 Elsevier Patient Education  2020 Elsevier Inc[employer]   

## 2020-07-01 NOTE — Progress Notes (Signed)
HIGH-RISK PREGNANCY VISIT Patient name: Michelle Pace MRN [id number]  Date of birth: [birth year] Chief Complaint:   Routine Prenatal Visit, High Risk Gestation, and Pregnancy Ultrasound  History of Present Illness:   Michelle Pace is a 40 y.o. [id number] female at [redacted]w[redacted]d with an Estimated Date of Delivery: 07/25/20 being seen today for ongoing management of a high-risk pregnancy complicated by chronic hypertension currently on labetalol 200mg  BID.    Today she reports no complaints. Contractions: Irritability. Vag. Bleeding: None.  Movement: Present. denies leaking of fluid.   Depression screen [facility] 2/9 04/23/2020 01/16/2020 10/31/2019  Decreased Interest 0 0 0  Down, Depressed, Hopeless 0 0 2  PHQ - 2 Score 0 0 2  Altered sleeping 1 1 0  Tired, decreased energy 1 1 0  Change in appetite 0 0 0  Feeling bad or failure about yourself  0 0 0  Trouble concentrating 0 0 0  Moving slowly or fidgety/restless 0 0 0  Suicidal thoughts 0 0 0  PHQ-9 Score 2 2 2      GAD 7 : Generalized Anxiety Score 04/23/2020 01/16/2020 10/31/2019  Nervous, Anxious, on Edge 2 1 1   Control/stop worrying 2 0 1  Worry too much - different things 0 0 1  Trouble relaxing 0 1 0  Restless 0 0 0  Easily annoyed or irritable 2 1 1   Afraid - awful might happen 0 0 0  Total GAD 7 Score 6 3 4      Review of Systems:   Pertinent items are noted in HPI Denies abnormal vaginal discharge w/ itching/odor/irritation, headaches, visual changes, shortness of breath, chest pain, abdominal pain, severe nausea/vomiting, or problems with urination or bowel movements unless otherwise stated above. Pertinent History Reviewed:  Reviewed past medical,surgical, social, obstetrical and family history.  Reviewed problem list, medications and allergies. Physical Assessment:   Vitals:   07/01/20 1522  BP: 130/80  Pulse: 93  Weight: 177 lb 9.6 oz (80.6 kg)  Body mass index is 34.69 kg/m.           Physical Examination:   General  appearance: alert, well appearing, and in no distress  Mental status: alert, oriented to person, place, and time  Skin: warm & dry   Extremities: Edema: None    Cardiovascular: normal heart rate noted  Respiratory: normal respiratory effort, no distress  Abdomen: gravid, soft, non-tender  Pelvic: Cervical exam performed  Dilation: 1 Effacement (%): Thick Station: Ballotable  Fetal Status: Fetal Heart Rate (bpm): 140 u/s   Movement: Present Presentation: Vertex  Fetal Surveillance Testing today:  11/02/2019 36+4 wks,cephalic,BPP 8/8,FHR 140 BPM,anterior placenta gr 3,AFI 21.4 cm,RI .50,.56,.47,.55=32%  Chaperone:    Results for orders placed or performed in visit on 07/01/20 (from the past 24 hour(s))  POC Urinalysis Dipstick OB   Collection Time: 07/01/20  3:20 PM  Result Value Ref Range   Color, UA     Clarity, UA     Glucose, UA Negative Negative   Bilirubin, UA     Ketones, UA neg    Spec Grav, UA     Blood, UA neg    pH, UA     POC,PROTEIN,UA Negative Negative, Trace, Small (1+), Moderate (2+), Large (3+), 4+   Urobilinogen, UA     Nitrite, UA neg    Leukocytes, UA Negative Negative   Appearance     Odor      Assessment & Plan:  High-risk pregnancy: 07/03/20 at [redacted]w[redacted]d with an  Estimated Date of Delivery: 07/25/20   1) CHTN, stable, on labetalol 200mg  BID, ASA, reviewed pre-e s/s, reasons to seek care   Meds: No orders of the defined types were placed in this encounter.   Labs/procedures today: GBS, GC/CT, SVE, and U/S  Treatment Plan:     Growth u/s q 4wks    2x/wk testing nst/sono     Deliver 38-39wks (37wks or prn if poor control)____   Reviewed: Preterm labor symptoms and general obstetric precautions including but not limited to vaginal bleeding, contractions, leaking of fluid and fetal movement were reviewed in detail with the patient.  All questions were answered. Does have home bp cuff. Office bp cuff given: not applicable. Check bp daily, let know if  consistently >150 and/or >95.  Follow-up: Return for As scheduled.   Future Appointments  Date Time Provider Department Center  07/04/2020 10:50 AM [facility] NURSE [facility]  07/08/2020  2:30 PM [facility] 07/10/2020 [facility] None  07/08/2020  3:30 PM 07/10/2020, DO [facility]  07/11/2020 10:50 AM [facility] NURSE [facility]  07/15/2020  2:30 PM [facility] 07/17/2020 [facility] None  07/15/2020  3:30 PM 07/17/2020, MD [facility]  07/18/2020 10:30 AM [facility] NURSE [facility]    Orders Placed This Encounter  Procedures   Culture, beta strep (group b only)   POC Urinalysis Dipstick OB   09/18/2020 CNM, [facility] 07/01/2020 3:58 PM

## 2020-07-03 LAB — CERVICOVAGINAL ANCILLARY ONLY
Chlamydia: NEGATIVE
Comment: NEGATIVE
Comment: NORMAL
Neisseria Gonorrhea: NEGATIVE

## 2020-07-04 ENCOUNTER — Other Ambulatory Visit: Payer: Self-pay

## 2020-07-04 ENCOUNTER — Ambulatory Visit (INDEPENDENT_AMBULATORY_CARE_PROVIDER_SITE_OTHER): Payer: Medicaid Other | Admitting: *Deleted

## 2020-07-04 VITALS — BP 127/88 | HR 106 | Wt 178.0 lb

## 2020-07-04 DIAGNOSIS — Z3A37 37 weeks gestation of pregnancy: Secondary | ICD-10-CM | POA: Diagnosis not present

## 2020-07-04 DIAGNOSIS — O10919 Unspecified pre-existing hypertension complicating pregnancy, unspecified trimester: Secondary | ICD-10-CM | POA: Diagnosis not present

## 2020-07-04 DIAGNOSIS — O288 Other abnormal findings on antenatal screening of mother: Secondary | ICD-10-CM

## 2020-07-04 DIAGNOSIS — O09529 Supervision of elderly multigravida, unspecified trimester: Secondary | ICD-10-CM | POA: Diagnosis not present

## 2020-07-04 DIAGNOSIS — Z1389 Encounter for screening for other disorder: Secondary | ICD-10-CM

## 2020-07-04 DIAGNOSIS — Z331 Pregnant state, incidental: Secondary | ICD-10-CM

## 2020-07-04 LAB — POCT URINALYSIS DIPSTICK OB
Blood, UA: NEGATIVE
Glucose, UA: NEGATIVE
Ketones, UA: NEGATIVE
Leukocytes, UA: NEGATIVE
Nitrite, UA: NEGATIVE

## 2020-07-04 NOTE — Progress Notes (Signed)
   NURSE VISIT- NST  SUBJECTIVE:  Michelle Pace is a 40 y.o. [phone] female at [redacted]w[redacted]d, here for a NST for pregnancy complicated by [facility].  She reports active fetal movement, contractions: occasional, vaginal bleeding: none, membranes: intact.   OBJECTIVE:  BP 127/88   Pulse (!) 106   LMP 10/19/2019   Appears well, no apparent distress  Results for orders placed or performed in visit on 07/04/20 (from the past 24 hour(s))  POC Urinalysis Dipstick OB   Collection Time: 07/04/20 11:32 AM  Result Value Ref Range   Color, UA     Clarity, UA     Glucose, UA Negative Negative   Bilirubin, UA     Ketones, UA neg    Spec Grav, UA     Blood, UA neg    pH, UA     POC,PROTEIN,UA Trace Negative, Trace, Small (1+), Moderate (2+), Large (3+), 4+   Urobilinogen, UA     Nitrite, UA neg    Leukocytes, UA Negative Negative   Appearance     Odor      NST: FHR baseline 130 bpm, Variability: moderate, Accelerations:present, Decelerations:  Absent= Cat 1/reactive Toco: occasional   ASSESSMENT: [id number] at [redacted]w[redacted]d with CHTN NST reactive  PLAN: EFM strip reviewed by Dr. Alysia Penna   Recommendations: keep next appointment as scheduled    Jobe Marker  07/04/2020 11:50 AM

## 2020-07-04 NOTE — Progress Notes (Signed)
Agree with A & P. 

## 2020-07-06 LAB — CULTURE, BETA STREP (GROUP B ONLY): Strep Gp B Culture: NEGATIVE

## 2020-07-08 ENCOUNTER — Ambulatory Visit (INDEPENDENT_AMBULATORY_CARE_PROVIDER_SITE_OTHER): Payer: Medicaid Other

## 2020-07-08 ENCOUNTER — Other Ambulatory Visit: Payer: Self-pay

## 2020-07-08 ENCOUNTER — Encounter: Payer: Self-pay | Admitting: Obstetrics & Gynecology

## 2020-07-08 ENCOUNTER — Ambulatory Visit (INDEPENDENT_AMBULATORY_CARE_PROVIDER_SITE_OTHER): Payer: Medicaid Other | Admitting: Obstetrics & Gynecology

## 2020-07-08 VITALS — BP 129/85 | HR 98 | Wt 179.2 lb

## 2020-07-08 DIAGNOSIS — Z3A37 37 weeks gestation of pregnancy: Secondary | ICD-10-CM

## 2020-07-08 DIAGNOSIS — O10919 Unspecified pre-existing hypertension complicating pregnancy, unspecified trimester: Secondary | ICD-10-CM | POA: Diagnosis not present

## 2020-07-08 DIAGNOSIS — Z2839 Other underimmunization status: Secondary | ICD-10-CM

## 2020-07-08 DIAGNOSIS — O09899 Supervision of other high risk pregnancies, unspecified trimester: Secondary | ICD-10-CM

## 2020-07-08 DIAGNOSIS — O0993 Supervision of high risk pregnancy, unspecified, third trimester: Secondary | ICD-10-CM

## 2020-07-08 DIAGNOSIS — O09529 Supervision of elderly multigravida, unspecified trimester: Secondary | ICD-10-CM

## 2020-07-08 DIAGNOSIS — O2341 Unspecified infection of urinary tract in pregnancy, first trimester: Secondary | ICD-10-CM

## 2020-07-08 DIAGNOSIS — F410 Panic disorder [episodic paroxysmal anxiety] without agoraphobia: Secondary | ICD-10-CM

## 2020-07-08 DIAGNOSIS — O09523 Supervision of elderly multigravida, third trimester: Secondary | ICD-10-CM

## 2020-07-08 LAB — POCT URINALYSIS DIPSTICK OB
Blood, UA: NEGATIVE
Glucose, UA: NEGATIVE
Ketones, UA: NEGATIVE
Leukocytes, UA: NEGATIVE
Nitrite, UA: NEGATIVE

## 2020-07-08 NOTE — Progress Notes (Signed)
[place] 37+4 wks,cephalic,fhr 146 bpm,anterior placenta gr 3,BPP 8/8,AFI 27.8 cm,polyhydramnios,RI .55,.50,.49=33%

## 2020-07-08 NOTE — Progress Notes (Signed)
HIGH-RISK PREGNANCY VISIT Patient name: Michelle Pace MRN [id number]  Date of birth: 1980/06/18 Chief Complaint:   Routine Prenatal Visit  History of Present Illness:   Michelle Pace is a 40 y.o. [id number] female at [redacted]w[redacted]d with an Estimated Date of Delivery: 07/25/20 being seen today for ongoing management of a high-risk pregnancy complicated by:  -cHTN- Labetalol 200mg  bid -on ASA daily    Today she reports no complaints.   Contractions: Not present. Vag. Bleeding: None.  Movement: Present. denies leaking of fluid.   Depression screen [facility] 2/9 04/23/2020 01/16/2020 10/31/2019  Decreased Interest 0 0 0  Down, Depressed, Hopeless 0 0 2  PHQ - 2 Score 0 0 2  Altered sleeping 1 1 0  Tired, decreased energy 1 1 0  Change in appetite 0 0 0  Feeling bad or failure about yourself  0 0 0  Trouble concentrating 0 0 0  Moving slowly or fidgety/restless 0 0 0  Suicidal thoughts 0 0 0  PHQ-9 Score 2 2 2      Current Outpatient Medications  Medication Instructions   aspirin 162 mg, Oral, Daily   Blood Pressure Monitor MISC For regular home bp monitoring during pregnancy   escitalopram (LEXAPRO) 20 MG tablet TAKE 1 TABLET BY MOUTH ONCE DAILY   labetalol (NORMODYNE) 200 mg, Oral, 2 times daily   prenatal vitamin w/FE, FA (PRENATAL 1 + 1) 27-1 MG TABS tablet 1 tablet, Oral, Daily     Review of Systems:   Pertinent items are noted in HPI Denies abnormal vaginal discharge w/ itching/odor/irritation, headaches, visual changes, shortness of breath, chest pain, abdominal pain, severe nausea/vomiting, or problems with urination or bowel movements unless otherwise stated above. Pertinent History Reviewed:  Reviewed past medical,surgical, social, obstetrical and family history.  Reviewed problem list, medications and allergies. Physical Assessment:   Vitals:   07/08/20 1508  BP: 129/85  Pulse: 98  Weight: 179 lb 3.2 oz (81.3 kg)  Body mass index is 35 kg/m.           Physical  Examination:   General appearance: alert, well appearing, and in no distress  Mental status: alert, oriented to person, place, and time  Skin: warm & dry   Extremities: Edema: None    Cardiovascular: normal heart rate noted  Respiratory: normal respiratory effort, no distress  Abdomen: gravid, soft, non-tender  Pelvic: Cervical exam performed  Dilation: 1 Effacement (%): Thick Station: Ballotable  Fetal Status: Fetal Heart Rate (bpm): 142 by   Movement: Present Presentation: Vertex  Fetal Surveillance Testing today: BPP- cephalic,fhr 146 bpm,anterior placenta gr 3,BPP 8/8,AFI 27.8 cm,polyhydramnios,RI .55,.50,.49=33%   Chaperone:  declined     Results for orders placed or performed in visit on 07/08/20 (from the past 24 hour(s))  POC Urinalysis Dipstick OB   Collection Time: 07/08/20  3:13 PM  Result Value Ref Range   Color, UA     Clarity, UA     Glucose, UA Negative Negative   Bilirubin, UA     Ketones, UA neg    Spec Grav, UA     Blood, UA neg    pH, UA     POC,PROTEIN,UA Small (1+) Negative, Trace, Small (1+), Moderate (2+), Large (3+), 4+   Urobilinogen, UA     Nitrite, UA neg    Leukocytes, UA Negative Negative   Appearance     Odor       Assessment & Plan:  High-risk pregnancy: 07/10/20 at [redacted]w[redacted]d with an  Estimated Date of Delivery: 07/25/20   1) chronic HTN -doing well with current medication -Reviewed plan for IOL and scheduled for 6/25  Meds: No orders of the defined types were placed in this encounter.   Labs/procedures today: BPP  Treatment Plan:  Continue antepartum testing, plan for IOL as scheduled  Reviewed: Term labor symptoms and general obstetric precautions including but not limited to vaginal bleeding, contractions, leaking of fluid and fetal movement were reviewed in detail with the patient.  All questions were answered. Pt has home bp cuff. Check bp weekly, let us know if >140/90.   Follow-up: Return in about 1 week (around 07/15/2020) for appt  as scheduled.   Future Appointments  Date Time Provider Department Center  07/11/2020 10:50 AM [facility] NURSE [facility]  07/15/2020  2:30 PM [facility] [place] [facility] None  07/15/2020  3:30 PM Lazaro Arms, MD [facility]  07/18/2020 10:30 AM [facility] NURSE [facility]    Orders Placed This Encounter  Procedures   POC Urinalysis Dipstick OB    Myna Hidalgo, DO Attending Obstetrician & Gynecologist, [facility] [employer], [facility]

## 2020-07-10 ENCOUNTER — Other Ambulatory Visit (HOSPITAL_COMMUNITY): Payer: Self-pay | Admitting: Advanced Practice Midwife

## 2020-07-11 ENCOUNTER — Other Ambulatory Visit: Payer: Self-pay

## 2020-07-11 ENCOUNTER — Ambulatory Visit (INDEPENDENT_AMBULATORY_CARE_PROVIDER_SITE_OTHER): Payer: Medicaid Other

## 2020-07-11 VITALS — BP 139/88 | HR 98 | Wt 177.0 lb

## 2020-07-11 DIAGNOSIS — O0993 Supervision of high risk pregnancy, unspecified, third trimester: Secondary | ICD-10-CM

## 2020-07-11 DIAGNOSIS — Z1389 Encounter for screening for other disorder: Secondary | ICD-10-CM | POA: Diagnosis not present

## 2020-07-11 DIAGNOSIS — Z3A38 38 weeks gestation of pregnancy: Secondary | ICD-10-CM | POA: Diagnosis not present

## 2020-07-11 LAB — POCT URINALYSIS DIPSTICK OB
Blood, UA: NEGATIVE
Glucose, UA: NEGATIVE
Ketones, UA: NEGATIVE
Leukocytes, UA: NEGATIVE
Nitrite, UA: NEGATIVE
POC,PROTEIN,UA: NEGATIVE

## 2020-07-11 NOTE — Progress Notes (Signed)
   NURSE VISIT- NST  SUBJECTIVE:  Michelle Pace is a 40 y.o. [phone] female at [redacted]w[redacted]d, here for a NST for pregnancy complicated by [facility] and CHTN.  She reports active fetal movement, contractions: intermittent Braxton Hicks, vaginal bleeding: none, membranes: intact.   OBJECTIVE:  BP 139/88   Pulse 98   Wt 177 lb (80.3 kg)   LMP 10/19/2019   BMI 34.57 kg/m   Appears well, no apparent distress  Results for orders placed or performed in visit on 07/11/20 (from the past 24 hour(s))  POC Urinalysis Dipstick OB   Collection Time: 07/11/20 11:20 AM  Result Value Ref Range   Color, UA     Clarity, UA     Glucose, UA Negative Negative   Bilirubin, UA     Ketones, UA neg    Spec Grav, UA     Blood, UA neg    pH, UA     POC,PROTEIN,UA Negative Negative, Trace, Small (1+), Moderate (2+), Large (3+), 4+   Urobilinogen, UA     Nitrite, UA neg    Leukocytes, UA Negative Negative   Appearance     Odor      NST: FHR baseline 130 bpm, Variability: moderate, Accelerations:present, Decelerations:  Absent= Cat 1/reactive Toco: irregular   ASSESSMENT: [id number] at [redacted]w[redacted]d with AMA and CHTN NST reactive  PLAN: EFM strip reviewed by Michelle Pace, CNM, WHNP   Recommendations: IOL tonight as scheduled  Wilburt Messina A Kramer Hanrahan  07/11/2020 12:21 PM

## 2020-07-12 ENCOUNTER — Other Ambulatory Visit: Payer: Self-pay

## 2020-07-13 ENCOUNTER — Inpatient Hospital Stay (HOSPITAL_COMMUNITY)
Admission: AD | Admit: 2020-07-13 | Discharge: 2020-07-15 | DRG: 798 | Disposition: A | Discharge status: Home / Self Care | Payer: Medicaid Other | Attending: Obstetrics & Gynecology | Admitting: Obstetrics & Gynecology

## 2020-07-13 ENCOUNTER — Inpatient Hospital Stay (HOSPITAL_COMMUNITY): Payer: Medicaid Other

## 2020-07-13 ENCOUNTER — Inpatient Hospital Stay (HOSPITAL_COMMUNITY): Payer: Medicaid Other | Admitting: Anesthesiology

## 2020-07-13 ENCOUNTER — Encounter (HOSPITAL_COMMUNITY): Payer: Self-pay | Admitting: Obstetrics & Gynecology

## 2020-07-13 DIAGNOSIS — O09899 Supervision of other high risk pregnancies, unspecified trimester: Secondary | ICD-10-CM

## 2020-07-13 DIAGNOSIS — Z302 Encounter for sterilization: Secondary | ICD-10-CM | POA: Diagnosis not present

## 2020-07-13 DIAGNOSIS — O119 Pre-existing hypertension with pre-eclampsia, unspecified trimester: Secondary | ICD-10-CM | POA: Diagnosis not present

## 2020-07-13 DIAGNOSIS — F410 Panic disorder [episodic paroxysmal anxiety] without agoraphobia: Secondary | ICD-10-CM | POA: Diagnosis present

## 2020-07-13 DIAGNOSIS — Z23 Encounter for immunization: Secondary | ICD-10-CM

## 2020-07-13 DIAGNOSIS — O1002 Pre-existing essential hypertension complicating childbirth: Principal | ICD-10-CM | POA: Diagnosis present

## 2020-07-13 DIAGNOSIS — Z7982 Long term (current) use of aspirin: Secondary | ICD-10-CM | POA: Diagnosis not present

## 2020-07-13 DIAGNOSIS — O10919 Unspecified pre-existing hypertension complicating pregnancy, unspecified trimester: Secondary | ICD-10-CM | POA: Diagnosis present

## 2020-07-13 DIAGNOSIS — O09529 Supervision of elderly multigravida, unspecified trimester: Secondary | ICD-10-CM

## 2020-07-13 DIAGNOSIS — Z20822 Contact with and (suspected) exposure to covid-19: Secondary | ICD-10-CM | POA: Diagnosis present

## 2020-07-13 DIAGNOSIS — O99344 Other mental disorders complicating childbirth: Secondary | ICD-10-CM | POA: Diagnosis present

## 2020-07-13 DIAGNOSIS — Z9851 Tubal ligation status: Secondary | ICD-10-CM

## 2020-07-13 DIAGNOSIS — Z3A38 38 weeks gestation of pregnancy: Secondary | ICD-10-CM

## 2020-07-13 DIAGNOSIS — Z2839 Other underimmunization status: Secondary | ICD-10-CM

## 2020-07-13 LAB — RPR: RPR Ser Ql: NONREACTIVE

## 2020-07-13 LAB — COMPREHENSIVE METABOLIC PANEL
ALT: 13 U/L (ref 0–44)
AST: 16 U/L (ref 15–41)
Albumin: 2.8 g/dL — ABNORMAL LOW (ref 3.5–5.0)
Alkaline Phosphatase: 96 U/L (ref 38–126)
Anion gap: 9 (ref 5–15)
BUN: 8 mg/dL (ref 6–20)
CO2: 17 mmol/L — ABNORMAL LOW (ref 22–32)
Calcium: 9.1 mg/dL (ref 8.9–10.3)
Chloride: 108 mmol/L (ref 98–111)
Creatinine, Ser: 0.79 mg/dL (ref 0.44–1.00)
GFR, Estimated: >60 mL/min (ref >60)
Glucose, Bld: 82 mg/dL (ref 70–99)
Potassium: 3.4 mmol/L — ABNORMAL LOW (ref 3.5–5.1)
Sodium: 134 mmol/L — ABNORMAL LOW (ref 135–145)
Total Bilirubin: 0.7 mg/dL (ref 0.3–1.2)
Total Protein: 6.2 g/dL — ABNORMAL LOW (ref 6.5–8.1)

## 2020-07-13 LAB — CBC
HCT: 34.7 % — ABNORMAL LOW (ref 36.0–46.0)
Hemoglobin: 11.8 g/dL — ABNORMAL LOW (ref 12.0–15.0)
MCH: 30.3 pg (ref 26.0–34.0)
MCHC: 34 g/dL (ref 30.0–36.0)
MCV: 89.2 fL (ref 80.0–100.0)
Platelets: 209 10*3/uL (ref 150–400)
RBC: 3.89 MIL/uL (ref 3.87–5.11)
RDW: 14.3 % (ref 11.5–15.5)
WBC: 13.3 10*3/uL — ABNORMAL HIGH (ref 4.0–10.5)
nRBC: 0 % (ref 0.0–0.2)

## 2020-07-13 LAB — PROTEIN / CREATININE RATIO, URINE
Creatinine, Urine: 478.89 mg/dL
Protein Creatinine Ratio: 0.07 mg/mg{Cre} (ref 0.00–0.15)
Total Protein, Urine: 34 mg/dL

## 2020-07-13 LAB — RESP PANEL BY RT-PCR (FLU A&B, COVID) ARPGX2
Influenza A by PCR: NEGATIVE
Influenza B by PCR: NEGATIVE
SARS Coronavirus 2 by RT PCR: NEGATIVE

## 2020-07-13 LAB — TYPE AND SCREEN
ABO/RH(D): A POS
Antibody Screen: NEGATIVE

## 2020-07-13 MED ORDER — OXYTOCIN BOLUS FROM INFUSION
333.0000 mL | Freq: Once | INTRAVENOUS | Status: AC
  Administered 2020-07-13: 333 mL via INTRAVENOUS

## 2020-07-13 MED ORDER — ACETAMINOPHEN 325 MG PO TABS: 650.0000 mg | ORAL_TABLET | ORAL | Status: DC | PRN

## 2020-07-13 MED ORDER — FENTANYL CITRATE (PF) 100 MCG/2ML IJ SOLN: 50.0000 ug | INTRAMUSCULAR | Status: DC | PRN

## 2020-07-13 MED ORDER — MEDROXYPROGESTERONE ACETATE 150 MG/ML IM SUSP: 150.0000 mg | INTRAMUSCULAR | Status: DC | PRN

## 2020-07-13 MED ORDER — LIDOCAINE HCL (PF) 1 % IJ SOLN
INTRAMUSCULAR | Status: DC | PRN
  Administered 2020-07-13: 5 mL via EPIDURAL
  Administered 2020-07-13: 3 mL via EPIDURAL

## 2020-07-13 MED ORDER — ESCITALOPRAM OXALATE 20 MG PO TABS: 20.0000 mg | ORAL_TABLET | Freq: Every day | ORAL | Status: DC

## 2020-07-13 MED ORDER — EPHEDRINE 5 MG/ML INJ: 10.0000 mg | INTRAVENOUS | Status: DC | PRN

## 2020-07-13 MED ORDER — MEASLES, MUMPS & RUBELLA VAC IJ SOLR
0.5000 mL | Freq: Once | INTRAMUSCULAR | Status: AC
  Administered 2020-07-15: 0.5 mL via SUBCUTANEOUS
  Filled 2020-07-13: qty 0.5

## 2020-07-13 MED ORDER — ZOLPIDEM TARTRATE 5 MG PO TABS: 5.0000 mg | ORAL_TABLET | Freq: Every evening | ORAL | Status: DC | PRN

## 2020-07-13 MED ORDER — MISOPROSTOL 50MCG HALF TABLET
50.0000 ug | ORAL_TABLET | ORAL | Status: DC
  Administered 2020-07-13: 50 ug via BUCCAL
  Filled 2020-07-13: qty 1

## 2020-07-13 MED ORDER — SOD CITRATE-CITRIC ACID 500-334 MG/5ML PO SOLN: 30.0000 mL | ORAL | Status: DC | PRN

## 2020-07-13 MED ORDER — DIPHENHYDRAMINE HCL 50 MG/ML IJ SOLN: 12.5000 mg | INTRAMUSCULAR | Status: DC | PRN

## 2020-07-13 MED ORDER — ONDANSETRON HCL 4 MG/2ML IJ SOLN
4.0000 mg | Freq: Four times a day (QID) | INTRAMUSCULAR | Status: DC | PRN
  Administered 2020-07-13: 4 mg via INTRAVENOUS
  Filled 2020-07-13: qty 2

## 2020-07-13 MED ORDER — OXYTOCIN-SODIUM CHLORIDE 30-0.9 UT/500ML-% IV SOLN
2.5000 [IU]/h | INTRAVENOUS | Status: DC
  Filled 2020-07-13: qty 500

## 2020-07-13 MED ORDER — TERBUTALINE SULFATE 1 MG/ML IJ SOLN: 0.2500 mg | Freq: Once | INTRAMUSCULAR | Status: DC | PRN

## 2020-07-13 MED ORDER — DIBUCAINE (PERIANAL) 1 % EX OINT: 1.0000 "application " | TOPICAL_OINTMENT | CUTANEOUS | Status: DC | PRN

## 2020-07-13 MED ORDER — LACTATED RINGERS IV SOLN: 500.0000 mL | Freq: Once | INTRAVENOUS | Status: DC

## 2020-07-13 MED ORDER — LACTATED RINGERS IV SOLN
500.0000 mL | INTRAVENOUS | Status: DC | PRN
  Administered 2020-07-13 (×2): 250 mL via INTRAVENOUS

## 2020-07-13 MED ORDER — PRENATAL MULTIVITAMIN CH
1.0000 | ORAL_TABLET | Freq: Every day | ORAL | Status: DC
  Administered 2020-07-15: 1 via ORAL
  Filled 2020-07-13: qty 1

## 2020-07-13 MED ORDER — SIMETHICONE 80 MG PO CHEW: 80.0000 mg | CHEWABLE_TABLET | ORAL | Status: DC | PRN

## 2020-07-13 MED ORDER — ONDANSETRON HCL 4 MG/2ML IJ SOLN: 4.0000 mg | INTRAMUSCULAR | Status: DC | PRN

## 2020-07-13 MED ORDER — OXYTOCIN-SODIUM CHLORIDE 30-0.9 UT/500ML-% IV SOLN
1.0000 m[IU]/min | INTRAVENOUS | Status: DC
Start: 2020-07-13 — End: 2020-07-13
  Administered 2020-07-13: 2 m[IU]/min via INTRAVENOUS

## 2020-07-13 MED ORDER — LACTATED RINGERS IV SOLN
500.0000 mL | Freq: Once | INTRAVENOUS | Status: AC
  Administered 2020-07-13: 500 mL via INTRAVENOUS

## 2020-07-13 MED ORDER — PHENYLEPHRINE 40 MCG/ML (10ML) SYRINGE FOR IV PUSH (FOR BLOOD PRESSURE SUPPORT)
80.0000 ug | PREFILLED_SYRINGE | INTRAVENOUS | Status: DC | PRN
  Filled 2020-07-13: qty 10

## 2020-07-13 MED ORDER — OXYTOCIN-SODIUM CHLORIDE 30-0.9 UT/500ML-% IV SOLN
1.0000 m[IU]/min | INTRAVENOUS | Status: DC
  Administered 2020-07-13: 1 m[IU]/min via INTRAVENOUS

## 2020-07-13 MED ORDER — LABETALOL HCL 200 MG PO TABS: 200.0000 mg | ORAL_TABLET | Freq: Two times a day (BID) | ORAL | Status: DC

## 2020-07-13 MED ORDER — FENTANYL CITRATE (PF) 100 MCG/2ML IJ SOLN: 100.0000 ug | INTRAMUSCULAR | Status: DC | PRN

## 2020-07-13 MED ORDER — OXYCODONE-ACETAMINOPHEN 5-325 MG PO TABS: 1.0000 | ORAL_TABLET | ORAL | Status: DC | PRN

## 2020-07-13 MED ORDER — FENTANYL-BUPIVACAINE-NACL 0.5-0.125-0.9 MG/250ML-% EP SOLN
12.0000 mL/h | EPIDURAL | Status: DC | PRN
  Administered 2020-07-13: 12 mL/h via EPIDURAL
  Filled 2020-07-13: qty 250

## 2020-07-13 MED ORDER — LIDOCAINE HCL (PF) 1 % IJ SOLN: 30.0000 mL | INTRAMUSCULAR | Status: DC | PRN

## 2020-07-13 MED ORDER — ACETAMINOPHEN 325 MG PO TABS
650.0000 mg | ORAL_TABLET | ORAL | Status: DC | PRN
  Administered 2020-07-14: 650 mg via ORAL
  Filled 2020-07-13: qty 2

## 2020-07-13 MED ORDER — OXYCODONE-ACETAMINOPHEN 5-325 MG PO TABS
2.0000 | ORAL_TABLET | ORAL | Status: DC | PRN
Start: 2020-07-13 — End: 2020-07-13

## 2020-07-13 MED ORDER — COCONUT OIL OIL: 1.0000 "application " | TOPICAL_OIL | Status: DC | PRN

## 2020-07-13 MED ORDER — OXYTOCIN-SODIUM CHLORIDE 30-0.9 UT/500ML-% IV SOLN
1.0000 m[IU]/min | INTRAVENOUS | Status: DC
  Administered 2020-07-13: 2 m[IU]/min via INTRAVENOUS

## 2020-07-13 MED ORDER — SENNOSIDES-DOCUSATE SODIUM 8.6-50 MG PO TABS
2.0000 | ORAL_TABLET | Freq: Every day | ORAL | Status: DC
  Administered 2020-07-14 – 2020-07-15 (×2): 2 via ORAL
  Filled 2020-07-13 (×2): qty 2

## 2020-07-13 MED ORDER — PHENYLEPHRINE 40 MCG/ML (10ML) SYRINGE FOR IV PUSH (FOR BLOOD PRESSURE SUPPORT): 80.0000 ug | PREFILLED_SYRINGE | INTRAVENOUS | Status: DC | PRN

## 2020-07-13 MED ORDER — LACTATED RINGERS IV SOLN: INTRAVENOUS | Status: DC

## 2020-07-13 MED ORDER — LABETALOL HCL 200 MG PO TABS
200.0000 mg | ORAL_TABLET | Freq: Two times a day (BID) | ORAL | Status: DC
  Administered 2020-07-13: 200 mg via ORAL
  Filled 2020-07-13: qty 1

## 2020-07-13 MED ORDER — TETANUS-DIPHTH-ACELL PERTUSSIS 5-2.5-18.5 LF-MCG/0.5 IM SUSY: 0.5000 mL | PREFILLED_SYRINGE | Freq: Once | INTRAMUSCULAR | Status: DC

## 2020-07-13 MED ORDER — WITCH HAZEL-GLYCERIN EX PADS: 1.0000 "application " | MEDICATED_PAD | CUTANEOUS | Status: DC | PRN

## 2020-07-13 MED ORDER — DIPHENHYDRAMINE HCL 25 MG PO CAPS: 25.0000 mg | ORAL_CAPSULE | Freq: Four times a day (QID) | ORAL | Status: DC | PRN

## 2020-07-13 MED ORDER — FERROUS SULFATE 325 (65 FE) MG PO TABS
325.0000 mg | ORAL_TABLET | ORAL | Status: DC
  Administered 2020-07-14: 325 mg via ORAL
  Filled 2020-07-13: qty 1

## 2020-07-13 MED ORDER — IBUPROFEN 600 MG PO TABS
600.0000 mg | ORAL_TABLET | Freq: Four times a day (QID) | ORAL | Status: DC
  Administered 2020-07-13 – 2020-07-15 (×6): 600 mg via ORAL
  Filled 2020-07-13 (×6): qty 1

## 2020-07-13 MED ORDER — ESCITALOPRAM OXALATE 20 MG PO TABS
20.0000 mg | ORAL_TABLET | Freq: Every day | ORAL | Status: DC
  Administered 2020-07-14 – 2020-07-15 (×2): 20 mg via ORAL
  Filled 2020-07-13 (×2): qty 1

## 2020-07-13 MED ORDER — ONDANSETRON HCL 4 MG PO TABS: 4.0000 mg | ORAL_TABLET | ORAL | Status: DC | PRN

## 2020-07-13 MED ORDER — BENZOCAINE-MENTHOL 20-0.5 % EX AERO: 1.0000 "application " | INHALATION_SPRAY | CUTANEOUS | Status: DC | PRN

## 2020-07-13 NOTE — H&P (Signed)
OBSTETRIC ADMISSION HISTORY AND PHYSICAL  Michelle Pace is a 40 y.o. female [phone] with IUP at [redacted]w[redacted]d by L/7 presenting for IOL secondary to cHTN (labetalol 200mg  BID). She reports +FMs, No LOF, no VB, no blurry vision, headaches or peripheral edema, and RUQ pain.  She plans on formula feeding. She is considering postpartum BTL for birth control.  She received her prenatal care at [facility]   Dating: By L/7 --->  Estimated Date of Delivery: 07/25/20  Sono:  @[redacted]w[redacted]d , CWD, normal anatomy, cephalic presentation, 2441g, 09/25/20 EFW  Prenatal History/Complications:  - chronic hypertension (labetalol 200mg  BID in pregnancy) - panic attacks (lexapro 20mg  daily in pregnancy) - Rubella non-immune  Past Medical History: Past Medical History:  Diagnosis Date   Panic attacks     Past Surgical History: Past Surgical History:  Procedure Laterality Date   WISDOM TOOTH EXTRACTION  11/2011    Obstetrical History: OB History     Gravida  4   Para  2   Term  2   Preterm      AB  1   Living  2      SAB  1   IAB      Ectopic      Multiple      Live Births  2           Social History Social History   Socioeconomic History   Marital status: Single    Spouse name: Not on file   Number of children: Not on file   Years of education: Not on file   Highest education level: Not on file  Occupational History   Not on file  Tobacco Use   Smoking status: Never   Smokeless tobacco: Never  Vaping Use   Vaping Use: Never used  Substance and Sexual Activity   Alcohol use: No   Drug use: No   Sexual activity: Yes    Birth control/protection: None  Other Topics Concern   Not on file  Social History Narrative   Not on file   Social Determinants of Health   Financial Resource Strain: Low Risk    Difficulty of Paying Living Expenses: Not hard at all  Food Insecurity: No Food Insecurity   Worried About 75% in the Last Year: Never true   Ran Out of Food  in the Last Year: Never true  Transportation Needs: No Transportation Needs   Lack of Transportation (Medical): No   Lack of Transportation (Non-Medical): No  Physical Activity: Insufficiently Active   Days of Exercise per Week: 3 days   Minutes of Exercise per Session: 10 min  Stress: Stress Concern Present   Feeling of Stress : To some extent  Social Connections: Moderately Isolated   Frequency of Communication with Friends and Family: More than three times a week   Frequency of Social Gatherings with Friends and Family: Once a week   Attends Religious Services: Never   or Organizations: No   Attends : Never   Marital Status: Living with partner    Family History: Family History  Problem Relation Age of Onset   Other Mother        brain tumor   Cancer Father    Stroke Maternal Grandmother    Asthma Son     Allergies: No Known Allergies  Medications Prior to Admission  Medication Sig Dispense Refill Last Dose   aspirin 81 MG chewable tablet Chew  2 tablets (162 mg total) by mouth daily. 60 tablet 7    Blood Pressure Monitor MISC For regular home bp monitoring during pregnancy 1 each 0    escitalopram (LEXAPRO) 20 MG tablet TAKE 1 TABLET BY MOUTH ONCE DAILY 30 tablet 11    labetalol (NORMODYNE) 200 MG tablet Take 1 tablet (200 mg total) by mouth 2 (two) times daily. 60 tablet 3    prenatal vitamin w/FE, FA (PRENATAL 1 + 1) 27-1 MG TABS tablet Take 1 tablet by mouth daily at 12 noon. 30 tablet 12      Review of Systems   All systems reviewed and negative except as stated in HPI  Last menstrual period 10/19/2019. General appearance: alert, cooperative, and appears stated age Lungs: normal WOB Heart: regular rate Abdomen: soft, non-tender Extremities: no sign of DVT Presentation: cephalic Fetal monitoringBaseline: 130 bpm, Variability: Good {> 6 bpm), Accelerations: Reactive, and Decelerations: Absent Uterine  activityFrequency: Every 2-4 minutes   Prenatal labs: ABO, Rh: A/Positive/-- (12/29 1122) Antibody: Negative (04/06 0821) Rubella: <0.90 (12/29 1122) RPR: Non Reactive (04/06 0821)  HBsAg: Negative (12/29 1122)  HIV: Non Reactive (04/06 0821)  GBS: Negative/-- (06/15 1145)  2 hr Glucola wnl Genetic screening  wnl Anatomy US wnl  Prenatal Transfer Tool  Maternal Diabetes: No Genetic Screening: Normal Maternal Ultrasounds/Referrals: Normal Fetal Ultrasounds or other Referrals:  Referred to Materal Fetal Medicine  Maternal Substance Abuse:  No Significant Maternal Medications:  Meds include: Other: labetalol, lexapro Significant Maternal Lab Results: Group B Strep negative  No results found for this or any previous visit (from the past 24 hour(s)).  Patient Active Problem List   Diagnosis Date Noted   Chronic hypertension affecting pregnancy 04/02/2020   Rubella non-immune status, antepartum 01/18/2020   Maternal UTI (urinary tract infection), first trimester 01/18/2020   AMA (advanced maternal age) multigravida 35+ 01/16/2020   Panic attacks 01/16/2020   Encounter for supervision of high risk multigravida of advanced maternal age, antepartum 01/15/2020    Assessment/Plan:  Michelle Pace is a 40 y.o. [id number] at [redacted]w[redacted]d here for IOL secondary to chronic hypertension.  #Labor: Given initial cervical exam will administer cytotec and then transition to pitocin as clinically indicated. #Pain: TBD per pt preference #FWB: Category 1 strip #ID: GBS negative #MOF: formula #MOC: considering postpartum BTL (consent signed 06/02/20) #Circ: desired #cHTN: Blood pressure within normal limits on admission. Will hold labetalol 200mg  BID at this time given plan for epidural. F/u Pt asymptomatic. F/u preeclampsia labs on admission.  , MD  07/13/2020, 12:04 AM

## 2020-07-13 NOTE — Progress Notes (Addendum)
S: Michelle Pace is up bouncing on the birthing ball doing well. She denies contractions, plevic pain, or pressure   O: Vitals:   07/13/20 1102 07/13/20 1300 07/13/20 1400 07/13/20 1445  BP: 127/88   139/87  Pulse: 74   83  Resp: 16 16 16 18   Temp: 98.3 F (36.8 C)     TempSrc: Oral     SpO2:      Weight:      Height:       FHT:  FHR: 145 bpm, variability: moderate,  accelerations:  Present,  decelerations:  Absent UC:   irregular, every 2-4 minutes SVE:   Dilation: 2 Effacement (%): 70 Station: Ballotable Exam by:: [email or web address], SNM   A / P: Induction of labor due to cHTN stable on Labetalol 200mg  BID.   -Since admission has been stable without meds. Continue to hold labetalol for now.  - S/p cytotec, Pitocin and repeat cytotec  x1.  - Progressed well with repeat cytotec.  - Initiate Pitocin 2x2 PRN  Fetal Wellbeing:  Category I Pain Control:   birthing ball.   Anticipated MOD:  NSVD  Vianney Kopecky Suzie Portela) , BSN, RNC-OB  Student Nurse-Midwife   07/13/2020  3:29 PM

## 2020-07-13 NOTE — Plan of Care (Signed)
  Problem: Education: Goal: Knowledge of General Education information will improve Description: Including pain rating scale, medication(s)/side effects and non-pharmacologic comfort measures Outcome: Progressing   Problem: Health Behavior/Discharge Planning: Goal: Ability to manage health-related needs will improve Outcome: Progressing   Problem: Clinical Measurements: Goal: Ability to maintain clinical measurements within normal limits will improve Outcome: Progressing Goal: Will remain free from infection Outcome: Progressing Goal: Diagnostic test results will improve Outcome: Progressing Goal: Respiratory complications will improve Outcome: Progressing Goal: Cardiovascular complication will be avoided Outcome: Progressing   Problem: Activity: Goal: Risk for activity intolerance will decrease Outcome: Progressing   Problem: Nutrition: Goal: Adequate nutrition will be maintained Outcome: Progressing   Problem: Coping: Goal: Level of anxiety will decrease Outcome: Progressing   Problem: Elimination: Goal: Will not experience complications related to bowel motility Outcome: Progressing Goal: Will not experience complications related to urinary retention Outcome: Progressing   Problem: Pain Managment: Goal: General experience of comfort will improve Outcome: Progressing   Problem: Safety: Goal: Ability to remain free from injury will improve Outcome: Progressing   Problem: Skin Integrity: Goal: Risk for impaired skin integrity will decrease Outcome: Progressing   Problem: Education: Goal: Knowledge of disease or condition will improve Outcome: Progressing Goal: Knowledge of the prescribed therapeutic regimen will improve Outcome: Progressing   Problem: Fluid Volume: Goal: Peripheral tissue perfusion will improve Outcome: Progressing   Problem: Clinical Measurements: Goal: Complications related to disease process, condition or treatment will be avoided or  minimized Outcome: Progressing

## 2020-07-13 NOTE — Discharge Summary (Signed)
Postpartum Discharge Summary     Patient Name: ALAIA Pace DOB: 23-Feb-1980 MRN: [id number]  Date of admission: 07/13/2020 Delivery date:07/13/2020  Delivering provider: Deloris Ping  Date of discharge: 07/15/2020  Admitting diagnosis: Chronic hypertension affecting pregnancy [O10.919] Intrauterine pregnancy: [redacted]w[redacted]d    Secondary diagnosis:  Active Problems:   AMA (advanced maternal age) multigravida 35+   Panic attacks   Rubella non-immune status, antepartum   Chronic hypertension affecting pregnancy   History of bilateral tubal ligation   Vaginal delivery  Additional problems:    Discharge diagnosis: Term Pregnancy Delivered                                              Post partum procedures: postpartum BTL Augmentation: Pitocin and Cytotec Complications: None  Hospital course: Induction of Labor With Vaginal Delivery   40y.o. yo [id number] admitted to the hospital 07/13/2020 for induction of labor.  Indication for induction:  Chronic hypertension on medicine .  Patient had an uncomplicated labor course as follows: Patient arrived for 0001 IOL on 07/13/2020. Patient arrived for IOL, patient receive cytotec and pitocin. After slow dilation all day, patient progressed rapidly from 6 cm to complete and +2. CODE APGAR was called due to infant's poor respiratory effort and limp extremities, see delivery note for more details.  Membrane Rupture Time/Date: 3:35 PM ,07/13/2020   Delivery Method:Vaginal, Spontaneous  Episiotomy: None  Lacerations:  Periurethral  Details of delivery can be found in separate delivery note. Postpartum bilateral tubal ligation was performed without complication on 06/21/99/60Pt was started on procardia after delivery with plan for 1 week blood pressure check in clinic. Patient had a routine postpartum course. Patient is discharged home 07/15/20.  Newborn Data: Birth date:07/13/2020  Birth time:7:23 PM  Gender:Female  Living status:Living   Apgars:6 ,8  Weight:2574 g   Magnesium Sulfate received: No BMZ received: No Rhophylac:No MMR:No T-DaP:Given prenatally Flu: N/A Transfusion:No  Physical exam  Vitals:   07/14/20 2300 07/15/20 0325 07/15/20 0330 07/15/20 0546  BP: 118/85 (!) 137/99 116/78 (!) 108/59  Pulse: 96  99 94  Resp: 17   15  Temp: 97.8 F (36.6 C)  97.8 F (36.6 C) 98 F (36.7 C)  TempSrc: Oral  Oral Oral  SpO2: 99%  97% 98%  Weight:      Height:       General: alert, cooperative, and no distress Lochia: appropriate Uterine Fundus: firm Incision: Honeycomb dressing is clean, dry, and intact DVT Evaluation: No evidence of DVT seen on physical exam. No cords or calf tenderness. No significant calf/ankle edema. Labs: Lab Results  Component Value Date   WBC 13.3 (H) 07/13/2020   HGB 11.8 (L) 07/13/2020   HCT 34.7 (L) 07/13/2020   MCV 89.2 07/13/2020   PLT 209 07/13/2020   CMP Latest Ref Rng & Units 07/13/2020  Glucose 70 - 99 mg/dL 82  BUN 6 - 20 mg/dL 8  Creatinine 0.44 - 1.00 mg/dL 0.79  Sodium 135 - 145 mmol/L 134(L)  Potassium 3.5 - 5.1 mmol/L 3.4(L)  Chloride 98 - 111 mmol/L 108  CO2 22 - 32 mmol/L 17(L)  Calcium 8.9 - 10.3 mg/dL 9.1  Total Protein 6.5 - 8.1 g/dL 6.2(L)  Total Bilirubin 0.3 - 1.2 mg/dL 0.7  Alkaline Phos 38 - 126 U/L 96  AST  15 - 41 U/L 16  ALT 0 - 44 U/L 13   Edinburgh Score: Edinburgh Postnatal Depression Scale Screening Tool 07/14/2020  I have been able to laugh and see the funny side of things. 0  I have looked forward with enjoyment to things. 0  I have blamed myself unnecessarily when things went wrong. 0  I have been anxious or worried for no good reason. 0  I have felt scared or panicky for no good reason. 0  Things have been getting on top of me. 0  I have been so unhappy that I have had difficulty sleeping. 0  I have felt sad or miserable. 0  I have been so unhappy that I have been crying. 1  The thought of harming myself has occurred to me. 0   Edinburgh Postnatal Depression Scale Total 1     After visit meds:  Allergies as of 07/15/2020   No Known Allergies      Medication List     STOP taking these medications    aspirin 81 MG chewable tablet   labetalol 200 MG tablet Commonly known as: NORMODYNE       TAKE these medications    acetaminophen 500 MG tablet Commonly known as: TYLENOL Take 2 tablets (1,000 mg total) by mouth every 6 (six) hours as needed for mild pain, moderate pain, fever or headache.   Blood Pressure Monitor Misc For regular home bp monitoring during pregnancy   coconut oil Oil Apply 1 application topically as needed (nipple pain).   escitalopram 20 MG tablet Commonly known as: LEXAPRO TAKE 1 TABLET BY MOUTH ONCE DAILY   ferrous sulfate 325 (65 FE) MG tablet Take 1 tablet (325 mg total) by mouth every other day. Start taking on: July 16, 2020   ibuprofen 600 MG tablet Commonly known as: ADVIL Take 1 tablet (600 mg total) by mouth every 6 (six) hours.   NIFEdipine 30 MG 24 hr tablet Commonly known as: ADALAT CC Take 1 tablet (30 mg total) by mouth daily.   prenatal vitamin w/FE, FA 27-1 MG Tabs tablet Take 1 tablet by mouth daily at 12 noon.         Discharge home in stable condition Infant Feeding: Bottle Infant Disposition:home with mother Discharge instruction: per After Visit Summary and Postpartum booklet. Activity: Advance as tolerated. Pelvic rest for 6 weeks.  Diet: routine diet Future Appointments: Future Appointments  Date Time Provider [place]  07/18/2020 10:30 AM [facility] NURSE [facility]   Follow up Visit: PP message sent by KK  Please schedule this patient for a In person postpartum visit in 6 weeks with the following provider: MD. Additional Postpartum F/U:BP check 1 week  High risk pregnancy complicated by: HTN- no meds  Delivery mode:  Vaginal, Spontaneous  Anticipated Birth Control:  BTL done PP   Kaitlyn Franko, Gildardo Cranker, MD OB Fellow,  Faculty Practice 07/15/2020 7:18 AM

## 2020-07-13 NOTE — Anesthesia Preprocedure Evaluation (Signed)
Anesthesia Evaluation  Patient identified by MRN, date of birth, ID band Patient awake    Reviewed: Allergy & Precautions, H&P , NPO status , Patient's Chart, lab work & pertinent test results  Airway Mallampati: II   Neck ROM: full    Dental   Pulmonary neg pulmonary ROS,    breath sounds clear to auscultation       Cardiovascular hypertension,  Rhythm:regular Rate:Normal     Neuro/Psych PSYCHIATRIC DISORDERS Anxiety    GI/Hepatic   Endo/Other    Renal/GU      Musculoskeletal   Abdominal   Peds  Hematology   Anesthesia Other Findings   Reproductive/Obstetrics (+) Pregnancy                             Anesthesia Physical Anesthesia Plan  ASA: 2  Anesthesia Plan: Epidural   Post-op Pain Management:    Induction: Intravenous  PONV Risk Score and Plan: 2 and Treatment may vary due to age or medical condition  Airway Management Planned: Natural Airway  Additional Equipment:   Intra-op Plan:   Post-operative Plan:   Informed Consent: I have reviewed the patients History and Physical, chart, labs and discussed the procedure including the risks, benefits and alternatives for the proposed anesthesia with the patient or authorized representative who has indicated his/her understanding and acceptance.       Plan Discussed with: Anesthesiologist  Anesthesia Plan Comments:         Anesthesia Quick Evaluation

## 2020-07-13 NOTE — Progress Notes (Signed)
Labor Progress Note                                                                                                                                                                                                                                                                                                                       Michelle Pace is a 40 y.o. [id number] at [redacted]w[redacted]d by LMP admitted for  [facility].   Subjective: [name] is resting comfortably. She states that she is currently not feeling any contractions.   Objective: Vitals:   07/13/20 0731 07/13/20 0801 07/13/20 0832 07/13/20 0901  BP: 118/70 124/70 114/69 118/62  Pulse: 84 89 72 78  Resp:    16  Temp:      TempSrc:      SpO2:      Weight:      Height:        FHT:  FHR: 135 bpm, variability: moderate,  accelerations:  Present,  decelerations:  Absent UC:   regular, every 2-3 minutes SVE:   Dilation: Fingertip Effacement (%): Thick Station: Ballotable Exam by:: Suzie Portela, SNM  Labs:   Recent Labs    07/13/20 0036  WBC 13.3*  HGB 11.8*  HCT 34.7*  PLT 209    Assessment / Plan: [id number] 40 y.o. [redacted]w[redacted]d Induction of labor due to [facility], s/p 4 hours of low-dose Pitocin, with no cervical change.   Discussed with pt and family d/c pitocin and resuming with Cytotec for cervical ripening. Also discussed option of FB. Pt will discuss with husband and decide.   Labor:  No progression with pit  -Plan for repeat cytotec  -Possible placement of FB Preeclampsia:  labs stable and Blood pressures continue to be normotensive w/o meds. Continue to hold for now.  Fetal Wellbeing:  Category I Pain Control:  Labor support without medications I/D:   GBS Negative  Anticipated MOD:  NSVD  Signed:  Uziel Covault Danella Deis) Suzie Portela, BSN, RNC-OB  Student Nurse-Midwife   07/13/2020  9:43 AM

## 2020-07-13 NOTE — Anesthesia Procedure Notes (Signed)
Epidural Patient location during procedure: OB Start time: 07/13/2020 4:15 PM End time: 07/13/2020 4:26 PM  Staffing Anesthesiologist: Achille Rich, MD Performed: anesthesiologist   Preanesthetic Checklist Completed: patient identified, IV checked, site marked, risks and benefits discussed, monitors and equipment checked, pre-op evaluation and timeout performed  Epidural Patient position: sitting Prep: DuraPrep Patient monitoring: heart rate, cardiac monitor, continuous pulse ox and blood pressure Approach: midline Location: L2-L3 Injection technique: LOR saline  Needle:  Needle type: Tuohy  Needle gauge: 17 G Needle length: 9 cm Needle insertion depth: 5 cm Catheter type: closed end flexible Catheter size: 19 Gauge Catheter at skin depth: 11 cm Test dose: negative and Other  Assessment Events: blood not aspirated, injection not painful, no injection resistance and negative IV test  Additional Notes Informed consent obtained prior to proceeding including risk of failure, 1% risk of PDPH, risk of minor discomfort and bruising.  Discussed rare but serious complications including epidural abscess, permanent nerve injury, epidural hematoma.  Discussed alternatives to epidural analgesia and patient desires to proceed.  Timeout performed pre-procedure verifying patient name, procedure, and platelet count.  Patient tolerated procedure well. Reason for block:procedure for pain

## 2020-07-13 NOTE — Progress Notes (Signed)
CH responded to neonatal code blue; when CH arrived at pt.'s rm. RN shared that baby had stabilized; family at bedside, but staff perceived no immediate need for chaplain support at this time.  Chaplains remain available.  Michelle Pace, Chaplain Pager: [phone]

## 2020-07-13 NOTE — Progress Notes (Signed)
Labor Progress Note Michelle Pace is a 40 y.o. [id number] at [redacted]w[redacted]d presented for IOL secondary to [facility].  S: Pt reporting increasing discomfort with contractions. Pt reports "feeling nervous" but no headache, vision changes or other concerns.  O:  BP 123/63   Pulse 77   Temp 98.4 F (36.9 C) (Oral)   Resp 18   Ht 5' (1.524 m)   Wt 80.2 kg   LMP 10/19/2019   SpO2 99%   BMI 34.53 kg/m  EFM: baseline 125/moderate variability/no accels/no decels Toco: contractions every 2-4 min  CVE: Dilation: 1 Effacement (%): 50 Cervical Position: Posterior Station: Ballotable Presentation: Vertex Exam by:: Dr. Barb Merino   A&P: 40 y.o. T6O0600 [redacted]w[redacted]d presented for IOL secondary to cHTN. #Labor: S/p cytotec x1. Given frequency of contractions will transition to pitocin. May consider FB at next check if still unchanged. #Pain: TBD per pt preference #FWB: Category 1 strip #GBS negative #cHTN: Holding po labetalol given low normal blood pressures. Only 1 mild range BP since admission. Pt asymptomatic. Preeclampsia labs unremarkable on admission. Will continue to monitor.  Sheila Oats, MD 6:31 AM

## 2020-07-14 ENCOUNTER — Encounter (HOSPITAL_COMMUNITY): Payer: Self-pay | Admitting: Family Medicine

## 2020-07-14 ENCOUNTER — Inpatient Hospital Stay (HOSPITAL_COMMUNITY): Payer: Medicaid Other | Admitting: Anesthesiology

## 2020-07-14 ENCOUNTER — Encounter (HOSPITAL_COMMUNITY)
Admission: AD | Disposition: A | Discharge status: Home / Self Care | Payer: Self-pay | Source: Home / Self Care | Attending: Obstetrics & Gynecology

## 2020-07-14 DIAGNOSIS — Z9851 Tubal ligation status: Secondary | ICD-10-CM

## 2020-07-14 DIAGNOSIS — Z302 Encounter for sterilization: Secondary | ICD-10-CM

## 2020-07-14 HISTORY — PX: TUBAL LIGATION: SHX77

## 2020-07-14 SURGERY — LIGATION, FALLOPIAN TUBE, POSTPARTUM
Anesthesia: Epidural

## 2020-07-14 MED ORDER — FENTANYL CITRATE (PF) 100 MCG/2ML IJ SOLN
INTRAMUSCULAR | Status: DC | PRN
  Administered 2020-07-14: 25 ug via INTRAVENOUS
  Administered 2020-07-14: 50 ug via INTRAVENOUS
  Administered 2020-07-14: 25 ug via INTRAVENOUS

## 2020-07-14 MED ORDER — LIDOCAINE HCL (PF) 2 % IJ SOLN: INTRAMUSCULAR | Status: DC | PRN

## 2020-07-14 MED ORDER — KETOROLAC TROMETHAMINE 30 MG/ML IJ SOLN
INTRAMUSCULAR | Status: DC | PRN
  Administered 2020-07-14: 30 mg via INTRAVENOUS

## 2020-07-14 MED ORDER — METOCLOPRAMIDE HCL 10 MG PO TABS: 10.0000 mg | ORAL_TABLET | Freq: Once | ORAL | Status: DC

## 2020-07-14 MED ORDER — FENTANYL CITRATE (PF) 100 MCG/2ML IJ SOLN
INTRAMUSCULAR | Status: AC
  Filled 2020-07-14: qty 2

## 2020-07-14 MED ORDER — LACTATED RINGERS IV SOLN: INTRAVENOUS | Status: DC

## 2020-07-14 MED ORDER — CHLOROPROCAINE HCL (PF) 3 % IJ SOLN
INTRAMUSCULAR | Status: DC | PRN
  Administered 2020-07-14: 20 mL

## 2020-07-14 MED ORDER — BUPIVACAINE HCL (PF) 0.25 % IJ SOLN
INTRAMUSCULAR | Status: DC | PRN
  Administered 2020-07-14: 5 mL

## 2020-07-14 MED ORDER — MIDAZOLAM HCL 2 MG/2ML IJ SOLN
INTRAMUSCULAR | Status: DC | PRN
  Administered 2020-07-14 (×2): 1 mg via INTRAVENOUS

## 2020-07-14 MED ORDER — METOCLOPRAMIDE HCL 10 MG PO TABS
10.0000 mg | ORAL_TABLET | Freq: Once | ORAL | Status: AC
  Administered 2020-07-14: 10 mg via ORAL
  Filled 2020-07-14: qty 1

## 2020-07-14 MED ORDER — ACETAMINOPHEN 500 MG PO TABS: 1000.0000 mg | ORAL_TABLET | Freq: Four times a day (QID) | ORAL | Status: DC | PRN

## 2020-07-14 MED ORDER — LIDOCAINE HCL (PF) 2 % IJ SOLN
INTRAMUSCULAR | Status: AC
  Filled 2020-07-14: qty 5

## 2020-07-14 MED ORDER — ONDANSETRON HCL 4 MG/2ML IJ SOLN
INTRAMUSCULAR | Status: AC
  Filled 2020-07-14: qty 2

## 2020-07-14 MED ORDER — FAMOTIDINE 20 MG PO TABS
40.0000 mg | ORAL_TABLET | Freq: Once | ORAL | Status: AC
  Administered 2020-07-14: 40 mg via ORAL
  Filled 2020-07-14: qty 2

## 2020-07-14 MED ORDER — LIDOCAINE HCL (PF) 2 % IJ SOLN
INTRAMUSCULAR | Status: DC | PRN
  Administered 2020-07-14: 60 mg via EPIDURAL
  Administered 2020-07-14: 100 mg via EPIDURAL

## 2020-07-14 MED ORDER — LIDOCAINE-EPINEPHRINE (PF) 2 %-1:200000 IJ SOLN: INTRAMUSCULAR | Status: DC | PRN

## 2020-07-14 MED ORDER — KETOROLAC TROMETHAMINE 30 MG/ML IJ SOLN
INTRAMUSCULAR | Status: AC
  Filled 2020-07-14: qty 1

## 2020-07-14 MED ORDER — FENTANYL CITRATE (PF) 100 MCG/2ML IJ SOLN
INTRAMUSCULAR | Status: DC | PRN
  Administered 2020-07-14: 100 ug via EPIDURAL

## 2020-07-14 MED ORDER — MIDAZOLAM HCL 2 MG/2ML IJ SOLN
INTRAMUSCULAR | Status: AC
  Filled 2020-07-14: qty 2

## 2020-07-14 MED ORDER — CHLOROPROCAINE HCL (PF) 3 % IJ SOLN
INTRAMUSCULAR | Status: AC
  Filled 2020-07-14: qty 20

## 2020-07-14 MED ORDER — OXYCODONE HCL 5 MG PO TABS
5.0000 mg | ORAL_TABLET | Freq: Once | ORAL | Status: AC
  Administered 2020-07-14: 5 mg via ORAL
  Filled 2020-07-14: qty 1

## 2020-07-14 MED ORDER — FAMOTIDINE 20 MG PO TABS: 40.0000 mg | ORAL_TABLET | Freq: Once | ORAL | Status: DC

## 2020-07-14 MED ORDER — BUPIVACAINE HCL (PF) 0.25 % IJ SOLN
INTRAMUSCULAR | Status: AC
  Filled 2020-07-14: qty 30

## 2020-07-14 MED ORDER — ACETAMINOPHEN 500 MG PO TABS
1000.0000 mg | ORAL_TABLET | Freq: Four times a day (QID) | ORAL | Status: DC
  Administered 2020-07-15 (×3): 1000 mg via ORAL
  Filled 2020-07-14 (×3): qty 2

## 2020-07-14 MED ORDER — LIDOCAINE-EPINEPHRINE (PF) 2 %-1:200000 IJ SOLN
INTRAMUSCULAR | Status: DC | PRN
  Administered 2020-07-14 (×4): 5 mL via EPIDURAL

## 2020-07-14 MED ORDER — CHLOROPROCAINE HCL (PF) 3 % IJ SOLN
INTRAMUSCULAR | Status: DC | PRN
  Administered 2020-07-14: 10 mL

## 2020-07-14 MED ORDER — ONDANSETRON HCL 4 MG/2ML IJ SOLN
INTRAMUSCULAR | Status: DC | PRN
  Administered 2020-07-14: 4 mg via INTRAVENOUS

## 2020-07-14 MED ORDER — NIFEDIPINE ER OSMOTIC RELEASE 30 MG PO TB24
30.0000 mg | ORAL_TABLET | Freq: Every day | ORAL | Status: DC
  Administered 2020-07-14 – 2020-07-15 (×2): 30 mg via ORAL
  Filled 2020-07-14 (×2): qty 1

## 2020-07-14 MED ORDER — CHLOROPROCAINE HCL (PF) 3 % IJ SOLN: INTRAMUSCULAR | Status: DC | PRN

## 2020-07-14 SURGICAL SUPPLY — 24 items
CLIP FILSHIE TUBAL LIGA STRL (Clip) ×2 IMPLANT
DECANTER SPIKE VIAL GLASS SM (MISCELLANEOUS) ×2 IMPLANT
DRSG OPSITE POSTOP 3X4 (GAUZE/BANDAGES/DRESSINGS) ×3 IMPLANT
DURAPREP 26ML APPLICATOR (WOUND CARE) ×3 IMPLANT
GLOVE SURG POLYISO LF SZ7 (GLOVE) ×3 IMPLANT
GLOVE SURG UNDER POLY LF SZ7 (GLOVE) ×6 IMPLANT
GOWN STRL REUS W/TWL LRG LVL3 (GOWN DISPOSABLE) ×3 IMPLANT
HEMOSTAT ARISTA ABSORB 3G PWDR (HEMOSTASIS) ×2 IMPLANT
NEEDLE HYPO 22GX1.5 SAFETY (NEEDLE) ×5 IMPLANT
NS IRRIG 1000ML POUR BTL (IV SOLUTION) ×3 IMPLANT
PACK ABDOMINAL MINOR (CUSTOM PROCEDURE TRAY) ×3 IMPLANT
PROTECTOR NERVE ULNAR (MISCELLANEOUS) ×3 IMPLANT
SPONGE GAUZE 2X2 8PLY STER LF (GAUZE/BANDAGES/DRESSINGS) ×1
SPONGE GAUZE 2X2 8PLY STRL LF (GAUZE/BANDAGES/DRESSINGS) ×2 IMPLANT
SPONGE LAP 4X18 RFD (DISPOSABLE) ×11 IMPLANT
SUT MON AB 2-0 CT1 36 (SUTURE) ×3 IMPLANT
SUT PLAIN 0 NONE (SUTURE) IMPLANT
SUT VIC AB 0 CT1 27 (SUTURE) ×3
SUT VIC AB 0 CT1 27XBRD ANBCTR (SUTURE) ×1 IMPLANT
SUT VIC AB 3-0 PS2 18 (SUTURE) ×3 IMPLANT
SUT VICRYL 0 UR6 27IN ABS (SUTURE) ×3 IMPLANT
SYR CONTROL 10ML LL (SYRINGE) ×5 IMPLANT
TOWEL OR 17X24 6PK STRL BLUE (TOWEL DISPOSABLE) ×3 IMPLANT
WATER STERILE IRR 1000ML POUR (IV SOLUTION) ×3 IMPLANT

## 2020-07-14 NOTE — Anesthesia Preprocedure Evaluation (Addendum)
Anesthesia Evaluation  Patient identified by MRN, date of birth, ID band Patient awake    Reviewed: Allergy & Precautions, H&P , NPO status , Patient's Chart, lab work & pertinent test results, reviewed documented beta blocker date and time   Airway Mallampati: II  TM Distance: >3 FB Neck ROM: Full    Dental  (+) Dental Advisory Given, Teeth Intact   Pulmonary neg pulmonary ROS,    Pulmonary exam normal breath sounds clear to auscultation- rhonchi       Cardiovascular hypertension, Pt. on medications and Pt. on home beta blockers Normal cardiovascular exam Rhythm:Regular Rate:Normal     Neuro/Psych PSYCHIATRIC DISORDERS Anxiety    GI/Hepatic   Endo/Other    Renal/GU      Musculoskeletal   Abdominal (+) + obese,   Peds  Hematology   Anesthesia Other Findings   Reproductive/Obstetrics                            Anesthesia Physical Anesthesia Plan  ASA: 2  Anesthesia Plan: Epidural   Post-op Pain Management:    Induction: Intravenous  PONV Risk Score and Plan: 3 and Ondansetron, Dexamethasone, Treatment may vary due to age or medical condition and Scopolamine patch - Pre-op  Airway Management Planned: Natural Airway  Additional Equipment: None  Intra-op Plan:   Post-operative Plan:   Informed Consent: I have reviewed the patients History and Physical, chart, labs and discussed the procedure including the risks, benefits and alternatives for the proposed anesthesia with the patient or authorized representative who has indicated his/her understanding and acceptance.     Dental advisory given  Plan Discussed with: CRNA  Anesthesia Plan Comments:        Anesthesia Quick Evaluation

## 2020-07-14 NOTE — Progress Notes (Signed)
Patient desires permanent sterilization.  Other reversible forms of contraception were discussed with patient; she declines all other modalities. Risks of procedure discussed with patient including but not limited to: risk of regret, permanence of method, bleeding, infection, injury to surrounding organs and need for additional procedures.  Failure risk of about 1% with increased risk of ectopic gestation if pregnancy occurs was also discussed with patient.  Also discussed possibility of post-tubal pain syndrome. The patient concurred with the proposed plan, giving informed written consent for the procedures.  Patient has been NPO since last night she will remain NPO for procedure. Anesthesia and OR aware.  

## 2020-07-14 NOTE — Progress Notes (Signed)
POSTPARTUM PROGRESS NOTE  Post Partum Day 1  Subjective:  Michelle Pace is a 40 y.o. [id number] s/p SVD at [redacted]w[redacted]d  No acute events overnight.  Pt denies problems with ambulating, voiding or po intake.  She denies nausea or vomiting.  Pain is moderately controlled.  She has not had flatus. She has not had bowel movement.  Lochia Minimal.   Objective: Blood pressure 136/84, pulse 72, temperature 99 F (37.2 C), temperature source Oral, resp. rate 18, height 5' (1.524 m), weight 80.2 kg, last menstrual period 10/19/2019, SpO2 100 %, unknown if currently breastfeeding.  Physical Exam:  General: alert, cooperative and no distress Chest: no respiratory distress Heart:regular rate Abdomen: soft, nontender,  Uterine Fundus: firm, appropriately tender DVT Evaluation: No calf swelling or tenderness Extremities: no edema Skin: warm, dry  Recent Labs    07/13/20 0036  HGB 11.8*  HCT 34.7*    Assessment/Plan: Michelle CAMBREis a 40y.o. [id number]/p SVD at 355w2d PPD#1 - Doing well cHTN: BP mostly high 130s/80s, start nifedipine 30 mg daily today (previously on labetalol 200 mg BID) Rubella non-immune: discussed, MMR prior to discharge Contraception: BTL, scheduled for noon today. NPO until procedure Feeding: bottle Dispo: Plan for discharge likely tomorrow.   LOS: 1 day   Michelle ButtonMD 07/14/2020, 7:49 AM

## 2020-07-14 NOTE — Op Note (Signed)
Postpartum Tubal Ligation Operative Note   Patient: Michelle Pace  Date of Procedure: 07/14/2020  Procedure: Postpartum bilateral Tubal Ligation via  modified Pomeroy on left side, and failed Filshie clip application on the right side followed by transection and ligation    Indications: undesired fertility  Pre-operative Diagnosis: Postpartum Tubal Ligation.   Post-operative Diagnosis: Same  Surgeon: Surgeon(s) and Role:    * Mahogony Gilchrest, Mary Sella, MD - Primary    * Myna Hidalgo, DO - Assisting    * Alric Seton, MD - Assisting  An experienced assistant was required given the standard of surgical care given the complexity of the case.  This assistant was needed for exposure, dissection, suctioning, retraction, instrument exchange, assisting with delivery with administration of fundal pressure, and for overall help during the procedure.   Anesthesia: epidural  Anesthesiologist: Dr. Renold Don  Antibiotics: None   Estimated Blood Loss: 198 ml   Total IV Fluids: 1200 ml  Urine Output:  500 cc OF clear urine  Specimens: portion of left fallopian tube to pathology   Complications: no complications   Indications: Michelle Pace is a 40 y.o. [phone] with undesired fertility, status post vaginal delivery, desires permanent sterilization.  Other reversible forms of contraception were discussed with patient; she declines all other modalities. Risks of procedure discussed with patient including but not limited to: risk of regret, permanence of method, bleeding, infection, injury to surrounding organs and need for additional procedures.  Failure risk of 1-2 % with increased risk of ectopic gestation if pregnancy occurs and possibility of post-tubal pain syndrome also discussed with patient. Clinical course notable for uncomplicated NSVD the prior evening.  Findings: Normal left ovary and fallopian tube. Normal right fallopian tube, unable to visualize R ovary. Significant  dextro-rotation of the uterus.   Procedure Details: The patient was taken to the operating room where her epidural anesthesia was dosed up to surgical level and found to be adequate.  She was then placed in the dorsal supine position and prepped and draped in sterile fashion.  After an adequate timeout was performed, attention was turned to the patient's abdomen where a small transverse skin incision was made under the umbilical fold. The incision was taken down to the layer of fascia using the scalpel, and fascia was incised, and extended bilaterally using Mayo scissors. The peritoneum was entered in a blunt fashion.   Attention was then turned to the patient's uterus, and left fallopian tube was identified and followed out to the fimbriated end. An avascular midsection of the tube approximately 3-4cm from the cornua was grasped with the Babcock clamps and brought into a knuckle. The tube was double ligated with 0 plain gut suture and the intervening portion of tube was transected and removed.   Attention was then turned to the right fallopian tube. This was exceedingly difficult to visualize due to severe dextro-rotation of the uterus as well as protruding bowel. After attempting to locate the tube for some time Dr. Charlotta Newton was called to the OR for assistance. After again unsuccessfully attempting to locate the tube we made the decision to extend the fascial incision. After extending the incision and using the suction to levo-rotate the uterus I was able to visualize the round ligament. I grasped this with a Babcock clamp and was able to further rotate the uterus until the fallopian tube was visualized, grasped with a Babcock clamp, and traced out to the fimbriated end. Due to the rotation of the uterus and the  depth of the operative field we were unable to perform a Pomeroy, and a Filshie was called for. During the application of the Filshie clip the tube avulsed completely. At that point both end of the  avulsed tube were ligated with 0 plain gut.  Good hemostasis was noted overall. The instruments were then removed from the patient's abdomen and the fascial incision was repaired with 0 Vicryl, the subcutaneous tissue was closed with plain gut, and the skin was closed with a 4-0 Vicryl subcuticular stitch. The patient tolerated the procedure well.  Instrument, sponge, and needle counts were correct times three.  The patient was then taken to the recovery room awake and in stable condition.  Disposition: PACU - hemodynamically stable.    Signed: Venora Maples, MD, MPH [facility])

## 2020-07-14 NOTE — Transfer of Care (Signed)
Immediate Anesthesia Transfer of Care Note  Patient: Michelle Pace  Procedure(s) Performed: POST PARTUM TUBAL LIGATION  Patient Location: PACU  Anesthesia Type:Epidural  Level of Consciousness: awake, alert  and oriented  Airway & Oxygen Therapy: Patient Spontanous Breathing  Post-op Assessment: Report given to RN, Post -op Vital signs reviewed and stable and Patient moving all extremities  Post vital signs: Reviewed and stable  Last Vitals:  Vitals Value Taken Time  BP 114/69 07/14/20 1338  Temp    Pulse 92 07/14/20 1341  Resp 20 07/14/20 1341  SpO2 95 % 07/14/20 1341  Vitals shown include unvalidated device data.  Last Pain:  Vitals:   07/14/20 1112  TempSrc: Oral  PainSc:          Complications: No notable events documented.

## 2020-07-14 NOTE — Anesthesia Postprocedure Evaluation (Signed)
Anesthesia Post Note  Patient: Michelle Pace  Procedure(s) Performed: AN AD HOC LABOR EPIDURAL     Patient location during evaluation: Mother Baby Anesthesia Type: Epidural Level of consciousness: awake Pain management: satisfactory to patient Vital Signs Assessment: post-procedure vital signs reviewed and stable Respiratory status: spontaneous breathing Cardiovascular status: stable Anesthetic complications: no   No notable events documented.  Last Vitals:  Vitals:   07/14/20 1415 07/14/20 1430  BP: 120/77 117/86  Pulse: 91 72  Resp: 19 11  Temp:  36.9 C  SpO2: 94% 94%    Last Pain:  Vitals:   07/14/20 1430  TempSrc: Oral  PainSc: 0-No pain   Pain Goal:                   [employer]

## 2020-07-14 NOTE — Social Work (Signed)
MOB was referred for history of anxiety.   * Referral screened out by Clinical Social Worker because none of the following criteria appear to apply:  ~ History of anxiety/depression during this pregnancy, or of post-partum depression following prior delivery. ~ Diagnosis of anxiety and/or depression within last 3 years. OR * MOB's symptoms currently being treated with medication and/or therapy. MOB currently treating with Lexapro 20mg.  Please contact the Clinical Social Worker if needs arise, by MOB request, or if MOB scores greater than 9/yes to question 10 on Edinburgh Postpartum Depression Screen.  Michelle Pace, LCSWA Clinical Social Work [facility]  [phone]  

## 2020-07-15 ENCOUNTER — Encounter: Payer: Medicaid Other | Admitting: Obstetrics & Gynecology

## 2020-07-15 ENCOUNTER — Other Ambulatory Visit (HOSPITAL_COMMUNITY): Payer: Self-pay

## 2020-07-15 ENCOUNTER — Other Ambulatory Visit: Payer: Medicaid Other

## 2020-07-15 MED ORDER — COCONUT OIL OIL: 1.0000 "application " | TOPICAL_OIL | 0 refills | Status: DC | PRN

## 2020-07-15 MED ORDER — NIFEDIPINE ER 30 MG PO TB24
30.0000 mg | ORAL_TABLET | Freq: Every day | ORAL | 0 refills | Status: DC
  Filled 2020-07-15: qty 45, 45d supply, fill #0

## 2020-07-15 MED ORDER — FERROUS SULFATE 325 (65 FE) MG PO TABS
325.0000 mg | ORAL_TABLET | ORAL | 2 refills | Status: DC
  Filled 2020-07-15: qty 30, 60d supply, fill #0

## 2020-07-15 MED ORDER — ACETAMINOPHEN 500 MG PO TABS
1000.0000 mg | ORAL_TABLET | Freq: Four times a day (QID) | ORAL | 0 refills | Status: DC | PRN
  Filled 2020-07-15: qty 30, 4d supply, fill #0

## 2020-07-15 MED ORDER — IBUPROFEN 600 MG PO TABS
600.0000 mg | ORAL_TABLET | Freq: Four times a day (QID) | ORAL | 0 refills | Status: DC
  Filled 2020-07-15: qty 30, 8d supply, fill #0

## 2020-07-15 NOTE — Anesthesia Postprocedure Evaluation (Signed)
Anesthesia Post Note  Patient: Michelle Pace  Procedure(s) Performed: POST PARTUM TUBAL LIGATION     Patient location during evaluation: PACU Anesthesia Type: Epidural Level of consciousness: awake and alert Pain management: pain level controlled Vital Signs Assessment: post-procedure vital signs reviewed and stable Respiratory status: spontaneous breathing Cardiovascular status: stable Postop Assessment: no headache, no backache and epidural receding Anesthetic complications: no   No notable events documented.  Last Vitals:  Vitals:   07/15/20 0546 07/15/20 0742  BP: (!) 108/59 128/83  Pulse: 94 86  Resp: 15 18  Temp: 36.7 C 36.4 C  SpO2: 98% 98%    Last Pain:  Vitals:   07/15/20 0742  TempSrc: Oral  PainSc: 0-No pain                 Lewie Loron

## 2020-07-16 LAB — SURGICAL PATHOLOGY

## 2020-07-18 ENCOUNTER — Ambulatory Visit (INDEPENDENT_AMBULATORY_CARE_PROVIDER_SITE_OTHER): Payer: Medicaid Other | Admitting: *Deleted

## 2020-07-18 DIAGNOSIS — O10919 Unspecified pre-existing hypertension complicating pregnancy, unspecified trimester: Secondary | ICD-10-CM

## 2020-07-18 NOTE — Progress Notes (Addendum)
   NURSE VISIT- BLOOD PRESSURE CHECK  SUBJECTIVE:  Michelle Pace is a 40 y.o. [phone] female here for BP check. She is postpartum, delivery date 07/13/20     HYPERTENSION ROS:  Postpartum:  Severe headaches that don't go away with tylenol/other medicines: No  Visual changes (seeing spots/double/blurred vision) No  Severe pain under right breast breast or in center of upper chest No  Severe nausea/vomiting No  Taking medicines as instructed yes  OBJECTIVE:  BP (!) 140/96 (BP Location: Right Arm, Patient Position: Sitting, Cuff Size: Normal)   Breastfeeding No   Appearance alert, well appearing, and in no distress and oriented to person, place, and time.  ASSESSMENT: Postpartum  blood pressure check. Did not take bp medication before coming to appointment today.   PLAN: Discussed with Philipp Deputy, CNM   Recommendations:Take Bp medication today.  Virtual visit next Tuesday for Bp check.  Advised to take medication prior to visit.     Follow-up: as scheduled   Jobe Marker  07/18/2020 1:04 PM  Chart reviewed for nurse visit. Agree with plan of care.  Arabella Merles, CNM 07/22/2020 11:56 PM

## 2020-07-22 ENCOUNTER — Telehealth (INDEPENDENT_AMBULATORY_CARE_PROVIDER_SITE_OTHER): Payer: Medicaid Other | Admitting: *Deleted

## 2020-07-22 ENCOUNTER — Other Ambulatory Visit: Payer: Self-pay

## 2020-07-22 VITALS — BP 133/96

## 2020-07-22 DIAGNOSIS — Z0130 Encounter for examination of blood pressure without abnormal findings: Secondary | ICD-10-CM

## 2020-07-22 NOTE — Progress Notes (Addendum)
   NURSE VISIT- BLOOD PRESSURE CHECK  I connected withNAME@ on 07/22/2020 by telephone  and verified that I am speaking with the correct person using two identifiers.   I discussed the limitations of evaluation and management by telemedicine. The patient expressed understanding and agreed to proceed.  Nurse is at the office, and patient is at home.  SUBJECTIVE:  Michelle Pace is a 40 y.o. [phone] female here for BP check. She is postpartum, delivery date 07/13/20 .  States she has had some elevated but some normal blood pressures but unable to give those readings.   HYPERTENSION ROS:  Postpartum:  Severe headaches that don't go away with tylenol/other medicines: No  Visual changes (seeing spots/double/blurred vision) No  Severe pain under right breast breast or in center of upper chest No  Severe nausea/vomiting No  Taking medicines as instructed yes  OBJECTIVE:  BP (!) 133/96   Appearance alert, well appearing, and in no distress and oriented to person, place, and time.  ASSESSMENT: Postpartum  blood pressure check  PLAN: Discussed with Joellyn Haff, CNM, [facility]   Recommendations:  come into office for BP check on 7/6  and bring home cuff.  Follow-up: as scheduled   Jobe Marker  07/22/2020 4:21 PM  Chart reviewed for nurse visit. Agree with plan of care. Pt told RN she feels her bp cuff is not accurate. To come to office tomorrow for bp check, bring her cuff. Cheral Marker, [place] 07/22/2020 4:36 PM

## 2020-07-23 ENCOUNTER — Ambulatory Visit (INDEPENDENT_AMBULATORY_CARE_PROVIDER_SITE_OTHER): Payer: Medicaid Other

## 2020-07-23 ENCOUNTER — Telehealth (HOSPITAL_COMMUNITY): Payer: Self-pay

## 2020-07-23 DIAGNOSIS — O165 Unspecified maternal hypertension, complicating the puerperium: Secondary | ICD-10-CM

## 2020-07-23 DIAGNOSIS — O10919 Unspecified pre-existing hypertension complicating pregnancy, unspecified trimester: Secondary | ICD-10-CM

## 2020-07-23 MED ORDER — NIFEDIPINE ER 60 MG PO TB24: 60.0000 mg | ORAL_TABLET | Freq: Every day | ORAL | 3 refills | Status: DC

## 2020-07-23 NOTE — Progress Notes (Addendum)
   NURSE VISIT- BLOOD PRESSURE CHECK  SUBJECTIVE:  Michelle Pace is a 40 y.o. [phone] female here for BP check. She is postpartum, delivery date 07/13/20     HYPERTENSION ROS:  Pregnant/postpartum:  Severe headaches that don't go away with tylenol/other medicines: No  Visual changes (seeing spots/double/blurred vision) No  Severe pain under right breast breast or in center of upper chest No  Severe nausea/vomiting No  Taking medicines as instructed yes  OBJECTIVE:  BP 136/100 BP 133/89 (BP Location: Left Arm, Patient Position: Sitting, Cuff Size: Normal) Comment: Office BP cuff  Pulse (!) 120   Breastfeeding No   Appearance alert, well appearing, and in no distress, oriented to person, place, and time, and normal appearing weight.  ASSESSMENT: Postpartum  blood pressure check  PLAN: Discussed with Michelle Pace, CNM, Michelle Pace   Recommendations: new prescription will be sent   Follow-up:  in 1 week    Michelle Pace  07/23/2020 3:53 PM   Chart reviewed for nurse visit. Agree with plan of care. Rx nifedipine 60mg  , Michelle Pace 07/23/2020 4:16 PM

## 2020-07-23 NOTE — Addendum Note (Signed)
Addended by: Cheral Marker on: 07/23/2020 04:16 PM   Modules accepted: Orders

## 2020-07-23 NOTE — Telephone Encounter (Signed)
No answer. Message left to return nurse call.   Marcelino Duster [facility] 07/23/2020,1938

## 2020-07-25 ENCOUNTER — Telehealth (HOSPITAL_COMMUNITY): Payer: Self-pay | Admitting: *Deleted

## 2020-07-25 NOTE — Telephone Encounter (Signed)
Mom reports feeling good. No concerns about herself. EPDS = 3 (hospital score = 1) Mom reports baby is feeding well. Gaining weight. No concerns about baby. Duffy Rhody, RN 07/25/2020 at 12:49pm

## 2020-07-30 ENCOUNTER — Ambulatory Visit (INDEPENDENT_AMBULATORY_CARE_PROVIDER_SITE_OTHER): Payer: Medicaid Other | Admitting: *Deleted

## 2020-07-30 ENCOUNTER — Other Ambulatory Visit: Payer: Self-pay

## 2020-07-30 ENCOUNTER — Encounter: Payer: Self-pay | Admitting: *Deleted

## 2020-07-30 VITALS — BP 123/93 | HR 97

## 2020-07-30 DIAGNOSIS — Z0130 Encounter for examination of blood pressure without abnormal findings: Secondary | ICD-10-CM

## 2020-07-30 NOTE — Progress Notes (Signed)
   NURSE VISIT- BLOOD PRESSURE CHECK  SUBJECTIVE:  Michelle Pace is a 40 y.o. [phone] female here for BP check. She is postpartum, delivery date 07/13/20     HYPERTENSION ROS:  Pregnant/postpartum:  Severe headaches that don't go away with tylenol/other medicines: No  Visual changes (seeing spots/double/blurred vision) No  Severe pain under right breast breast or in center of upper chest No  Severe nausea/vomiting No  Taking medicines as instructed yes    OBJECTIVE:  BP (!) 123/93 (BP Location: Left Arm, Patient Position: Sitting, Cuff Size: Normal)   Pulse 97   Appearance alert, well appearing, and in no distress.  ASSESSMENT: Postpartum  blood pressure check. Pt mentioned pain at bra line, on left side. Pt is taking Tylenol and that helps some. Dr. Charlotta Newton advised it may be the way she is holding the baby. Continue Tylenol prn and use ice. Let us know if pain don't get better.   PLAN: Discussed with Dr. Charlotta Newton   Recommendations: no changes needed   Follow-up: in 2 weeks   Malachy Mood  07/30/2020 3:01 PM

## 2020-08-22 ENCOUNTER — Ambulatory Visit: Payer: Medicaid Other | Admitting: Women's Health

## 2020-09-02 ENCOUNTER — Encounter: Payer: Self-pay | Admitting: *Deleted

## 2020-09-17 ENCOUNTER — Ambulatory Visit: Payer: Medicaid Other | Admitting: Advanced Practice Midwife

## 2020-10-10 ENCOUNTER — Ambulatory Visit: Payer: Medicaid Other | Admitting: Family Medicine

## 2020-10-15 ENCOUNTER — Ambulatory Visit: Payer: Medicaid Other | Admitting: Advanced Practice Midwife

## 2020-11-10 ENCOUNTER — Other Ambulatory Visit (HOSPITAL_COMMUNITY): Payer: Self-pay

## 2020-11-19 ENCOUNTER — Ambulatory Visit: Payer: Medicaid Other

## 2020-11-19 ENCOUNTER — Ambulatory Visit
Admission: EM | Admit: 2020-11-19 | Discharge: 2020-11-19 | Disposition: A | Discharge status: Home / Self Care | Payer: Medicaid Other | Attending: Urgent Care | Admitting: Urgent Care

## 2020-11-19 ENCOUNTER — Other Ambulatory Visit: Payer: Self-pay

## 2020-11-19 ENCOUNTER — Encounter: Payer: Self-pay | Admitting: Emergency Medicine

## 2020-11-19 DIAGNOSIS — Z20822 Contact with and (suspected) exposure to covid-19: Secondary | ICD-10-CM | POA: Diagnosis not present

## 2020-11-19 DIAGNOSIS — Z20818 Contact with and (suspected) exposure to other bacterial communicable diseases: Secondary | ICD-10-CM

## 2020-11-19 DIAGNOSIS — J069 Acute upper respiratory infection, unspecified: Secondary | ICD-10-CM | POA: Diagnosis present

## 2020-11-19 HISTORY — DX: Essential (primary) hypertension: I10

## 2020-11-19 LAB — POCT RAPID STREP A (OFFICE): Rapid Strep A Screen: NEGATIVE

## 2020-11-19 MED ORDER — IBUPROFEN 800 MG PO TABS
800.0000 mg | ORAL_TABLET | Freq: Once | ORAL | Status: AC
  Administered 2020-11-19: 800 mg via ORAL

## 2020-11-19 MED ORDER — BENZONATATE 100 MG PO CAPS: 100.0000 mg | ORAL_CAPSULE | Freq: Three times a day (TID) | ORAL | 0 refills | Status: DC | PRN

## 2020-11-19 MED ORDER — PSEUDOEPHEDRINE HCL 30 MG PO TABS: 30.0000 mg | ORAL_TABLET | Freq: Three times a day (TID) | ORAL | 0 refills | Status: DC | PRN

## 2020-11-19 MED ORDER — CETIRIZINE HCL 10 MG PO TABS: 10.0000 mg | ORAL_TABLET | Freq: Every day | ORAL | 0 refills | Status: DC

## 2020-11-19 MED ORDER — PROMETHAZINE-DM 6.25-15 MG/5ML PO SYRP: 5.0000 mL | ORAL_SOLUTION | Freq: Every evening | ORAL | 0 refills | Status: DC | PRN

## 2020-11-19 NOTE — ED Triage Notes (Signed)
Fever, productive cough - yellow sputum, nasal congestion since Monday

## 2020-11-19 NOTE — ED Provider Notes (Signed)
-[facility]   MRN: [id number] DOB: 1980/04/26  Subjective:   Michelle Pace is a 40 y.o. female presenting for 3-day history of acute onset persistent fever, productive cough, sinus congestion.  Her son tested positive for strep today.  No chest pain, shortness of breath or wheezing.  No current facility-administered medications for this encounter.  Current Outpatient Medications:    acetaminophen (TYLENOL) 500 MG tablet, Take 2 tablets (1,000 mg total) by mouth every 6 (six) hours as needed for mild pain, moderate pain, fever or headache., Disp: 30 tablet, Rfl: 0   Blood Pressure Monitor MISC, For regular home bp monitoring during pregnancy, Disp: 1 each, Rfl: 0   coconut oil OIL, Apply 1 application topically as needed (nipple pain)., Disp: , Rfl: 0   escitalopram (LEXAPRO) 20 MG tablet, TAKE 1 TABLET BY MOUTH ONCE DAILY, Disp: 30 tablet, Rfl: 11   ferrous sulfate 325 (65 FE) MG tablet, Take 1 tablet (325 mg total) by mouth every other day., Disp: 30 tablet, Rfl: 2   ibuprofen (ADVIL) 600 MG tablet, Take 1 tablet (600 mg total) by mouth every 6 (six) hours. (Patient not taking: Reported on 07/30/2020), Disp: 30 tablet, Rfl: 0   NIFEdipine (ADALAT CC) 60 MG 24 hr tablet, Take 1 tablet (60 mg total) by mouth daily., Disp: 30 tablet, Rfl: 3   prenatal vitamin w/FE, FA (PRENATAL 1 + 1) 27-1 MG TABS tablet, Take 1 tablet by mouth daily at 12 noon., Disp: 30 tablet, Rfl: 12   No Known Allergies  Past Medical History:  Diagnosis Date   Hypertension    Panic attacks      Past Surgical History:  Procedure Laterality Date   TUBAL LIGATION N/A 07/14/2020   Procedure: POST PARTUM TUBAL LIGATION;  Surgeon: Venora Maples, MD;  Location: [facility] LD ORS;  Service: Gynecology;  Laterality: N/A;   WISDOM TOOTH EXTRACTION  11/2011    Family History  Problem Relation Age of Onset   Stroke Maternal Grandmother    Cancer Father    Other Mother        brain tumor   Asthma Son      Social History   Tobacco Use   Smoking status: Never   Smokeless tobacco: Never  Vaping Use   Vaping Use: Never used  Substance Use Topics   Alcohol use: No   Drug use: No    ROS   Objective:   Vitals: BP 135/88 (BP Location: Right Arm)   Pulse (!) 134   Temp (!) 102.1 F (38.9 C) (Oral)   Resp 18   LMP 11/19/2020 (Exact Date)   SpO2 98%   Physical Exam Constitutional:      General: She is not in acute distress.    Appearance: Normal appearance. She is well-developed. She is not ill-appearing, toxic-appearing or diaphoretic.  HENT:     Head: Normocephalic and atraumatic.     Right Ear: Tympanic membrane, ear canal and external ear normal. No drainage or tenderness. No middle ear effusion. Tympanic membrane is not erythematous.     Left Ear: Tympanic membrane, ear canal and external ear normal. No drainage or tenderness.  No middle ear effusion. Tympanic membrane is not erythematous.     Nose: Congestion and rhinorrhea present.     Mouth/Throat:     Mouth: Mucous membranes are moist. No oral lesions.     Pharynx: No pharyngeal swelling, oropharyngeal exudate, posterior oropharyngeal erythema or uvula swelling.     Tonsils:  No tonsillar exudate or tonsillar abscesses.  Eyes:     General: No scleral icterus.       Right eye: No discharge.        Left eye: No discharge.     Extraocular Movements: Extraocular movements intact.     Right eye: Normal extraocular motion.     Left eye: Normal extraocular motion.     Conjunctiva/sclera: Conjunctivae normal.     Pupils: Pupils are equal, round, and reactive to light.  Cardiovascular:     Rate and Rhythm: Normal rate and regular rhythm.     Pulses: Normal pulses.     Heart sounds: Normal heart sounds. No murmur heard.   No friction rub. No gallop.  Pulmonary:     Effort: Pulmonary effort is normal. No respiratory distress.     Breath sounds: Normal breath sounds. No stridor. No wheezing, rhonchi or rales.   Musculoskeletal:     Cervical back: Normal range of motion and neck supple.  Lymphadenopathy:     Cervical: No cervical adenopathy.  Skin:    General: Skin is warm and dry.     Findings: No rash.  Neurological:     General: No focal deficit present.     Mental Status: She is alert and oriented to person, place, and time.  Psychiatric:        Mood and Affect: Mood normal.        Behavior: Behavior normal.        Thought Content: Thought content normal.    Results for orders placed or performed during the hospital encounter of 11/19/20 (from the past 24 hour(s))  POCT rapid strep A     Status: None   Collection Time: 11/19/20  6:36 PM  Result Value Ref Range   Rapid Strep A Screen Negative Negative    Assessment and Plan :   PDMP not reviewed this encounter.  1. Viral URI with cough   2. Exposure to COVID-19 virus   3. Exposure to strep throat    Does not meet criteria for being a high risk patient and therefore we will hold off on using Tamiflu.  Will manage for viral illness such as viral URI, viral syndrome, viral rhinitis, COVID-19, viral pharyngitis, influenza. Recommended supportive care. Offered scripts for symptomatic relief. COVID 19, influenza and strep culture are pending. Counseled patient on potential for adverse effects with medications prescribed/recommended today, ER and return-to-clinic precautions discussed, patient verbalized understanding.      Wallis Bamberg, [place] 11/19/20 4580

## 2020-11-19 NOTE — Discharge Instructions (Signed)
We will notify you of your test results as they arrive and may take between 48-72 hours.  I encourage you to sign up for MyChart if you have not already done so as this can be the easiest way for us to communicate results to you online or through a phone app.  Generally, we only contact you if it is a positive test result.  In the meantime, if you develop worsening symptoms including fever, chest pain, shortness of breath despite our current treatment plan then please report to the emergency room as this may be a sign of worsening status from possible viral infection.  Otherwise, we will manage this as a viral syndrome. For sore throat or cough try using a honey-based tea. Use 3 teaspoons of honey with juice squeezed from half lemon. Place shaved pieces of ginger into 1/2-1 cup of water and warm over stove top. Then mix the ingredients and repeat every 4 hours as needed. Please take Tylenol 500mg-650mg every 6 hours for aches and pains, fevers. Hydrate very well with at least 2 liters of water. Eat light meals such as soups to replenish electrolytes and soft fruits, veggies. Start an antihistamine like Zyrtec for postnasal drainage, sinus congestion.  You can take this together with pseudoephedrine (Sudafed) at a dose of 30 mg 2-3 times a day as needed for the same kind of congestion.    

## 2020-11-20 LAB — COVID-19, FLU A+B NAA
Influenza A, NAA: DETECTED — AB
Influenza B, NAA: NOT DETECTED
SARS-CoV-2, NAA: NOT DETECTED

## 2020-11-22 ENCOUNTER — Other Ambulatory Visit: Payer: Self-pay | Admitting: Women's Health

## 2020-11-22 LAB — CULTURE, GROUP A STREP (THRC)

## 2020-11-26 ENCOUNTER — Other Ambulatory Visit: Payer: Self-pay

## 2020-11-26 ENCOUNTER — Encounter: Payer: Self-pay | Admitting: Advanced Practice Midwife

## 2020-11-26 ENCOUNTER — Ambulatory Visit (INDEPENDENT_AMBULATORY_CARE_PROVIDER_SITE_OTHER): Payer: Medicaid Other | Admitting: Advanced Practice Midwife

## 2020-11-26 VITALS — BP 119/86 | HR 95 | Ht 60.0 in | Wt 155.0 lb

## 2020-11-26 DIAGNOSIS — I10 Essential (primary) hypertension: Secondary | ICD-10-CM | POA: Diagnosis not present

## 2020-11-26 MED ORDER — NIFEDIPINE ER 60 MG PO TB24: 60.0000 mg | ORAL_TABLET | Freq: Every day | ORAL | 1 refills | Status: DC

## 2020-11-26 NOTE — Patient Instructions (Signed)
Primary Care Providers Dr. Dwana Melena [place]) [phone] [facility] [phone] [facility] [place]) [phone] [facility] [place]) [phone] [facility] [place]) [phone] [facility] [place] [phone] [facility] [phone]

## 2020-11-26 NOTE — Progress Notes (Signed)
GYN VISIT Patient name: Michelle Pace MRN [id number]  Date of birth: [birth year] Chief Complaint:   Blood Pressure Check  History of Present Illness:   Michelle Pace is a 40 y.o. [phone] Caucasian female being seen today for BP check at 4.5 months following induced vag del for cHTN and ppBTL. She has been taking Procardia XL 60mg  but ran out a little while ago; doesn't have a PCP. Doing well on Lexapro 20mg .  Patient's last menstrual period was 11/19/2020 (exact date). The current method of family planning is tubal ligation.  Last pap Oct 2021. Results were: NILM w/ HRHPV negative  Depression screen [facility] 2/9 04/23/2020 01/16/2020 10/31/2019  Decreased Interest 0 0 0  Down, Depressed, Hopeless 0 0 2  PHQ - 2 Score 0 0 2  Altered sleeping 1 1 0  Tired, decreased energy 1 1 0  Change in appetite 0 0 0  Feeling bad or failure about yourself  0 0 0  Trouble concentrating 0 0 0  Moving slowly or fidgety/restless 0 0 0  Suicidal thoughts 0 0 0  PHQ-9 Score 2 2 2      GAD 7 : Generalized Anxiety Score 04/23/2020 01/16/2020 10/31/2019  Nervous, Anxious, on Edge 2 1 1   Control/stop worrying 2 0 1  Worry too much - different things 0 0 1  Trouble relaxing 0 1 0  Restless 0 0 0  Easily annoyed or irritable 2 1 1   Afraid - awful might happen 0 0 0  Total GAD 7 Score 6 3 4      Review of Systems:   Pertinent items are noted in HPI Denies fever/chills, dizziness, headaches, visual disturbances, fatigue, shortness of breath, chest pain, abdominal pain, vomiting, abnormal vaginal discharge/itching/odor/irritation, problems with periods, bowel movements, urination, or intercourse unless otherwise stated above.  Pertinent History Reviewed:  Reviewed past medical,surgical, social, obstetrical and family history.  Reviewed problem list, medications and allergies. Physical Assessment:   Vitals:   11/26/20 1036  BP: 119/86  Pulse: 95  Weight: 155 lb (70.3 kg)  Height: 5' (1.524 m)  Body  mass index is 30.27 kg/m.       Physical Examination:   General appearance: alert, well appearing, and in no distress  Mental status: alert, oriented to person, place, and time  Skin: warm & dry   Cardiovascular: normal heart rate noted  Respiratory: normal respiratory effort, no distress  Abdomen: soft, non-tender   Pelvic: examination not indicated  Extremities: no edema    No results found for this or any previous visit (from the past 24 hour(s)).  Assessment & Plan:  1) cHTN> dx at 23wks with elevations in the first trimester as well; has been on Procardia XL 60mg  daily since delivery with nl BP today; gave a refill on meds, but recommended stop taking for now and keep BP log- restart if >140/90; strongly recommended establishing PCP for BP management- she agrees- list given  2) Panic attacks> stable on Lexapro 20mg ; PCP can manage this as well  Meds:  Meds ordered this encounter  Medications   NIFEdipine (ADALAT CC) 60 MG 24 hr tablet    Sig: Take 1 tablet (60 mg total) by mouth daily.    Dispense:  30 tablet    Refill:  1    Order Specific Question:   Supervising Provider    Answer:   01/18/2020, LUTHER H [[id number]]    No orders of the defined types were placed in this encounter.  Return in about 1 year (around 11/26/2021) for Physical.  Arabella Merles [facility] 11/26/2020 10:55 AM

## 2021-01-16 ENCOUNTER — Other Ambulatory Visit: Payer: Self-pay | Admitting: Advanced Practice Midwife

## 2021-01-20 ENCOUNTER — Other Ambulatory Visit: Payer: Self-pay | Admitting: *Deleted

## 2021-01-20 MED ORDER — ESCITALOPRAM OXALATE 20 MG PO TABS: 20.0000 mg | ORAL_TABLET | Freq: Every day | ORAL | 11 refills | Status: DC

## 2021-01-21 ENCOUNTER — Other Ambulatory Visit: Payer: Self-pay | Admitting: Advanced Practice Midwife

## 2021-03-04 ENCOUNTER — Telehealth: Payer: Self-pay

## 2021-03-04 NOTE — Telephone Encounter (Signed)
Called pt for clarification on BP med refill request, no answer, left vm

## 2021-03-11 ENCOUNTER — Encounter: Payer: Self-pay | Admitting: Obstetrics & Gynecology

## 2021-03-12 ENCOUNTER — Ambulatory Visit (INDEPENDENT_AMBULATORY_CARE_PROVIDER_SITE_OTHER): Payer: Medicaid Other | Admitting: Nurse Practitioner

## 2021-03-12 ENCOUNTER — Other Ambulatory Visit: Payer: Self-pay

## 2021-03-12 ENCOUNTER — Encounter: Payer: Self-pay | Admitting: Nurse Practitioner

## 2021-03-12 VITALS — BP 114/83 | HR 129 | Temp 98.3°F | Ht 60.0 in | Wt 165.8 lb

## 2021-03-12 DIAGNOSIS — Z13220 Encounter for screening for lipoid disorders: Secondary | ICD-10-CM

## 2021-03-12 DIAGNOSIS — I10 Essential (primary) hypertension: Secondary | ICD-10-CM | POA: Diagnosis not present

## 2021-03-12 MED ORDER — VALSARTAN 40 MG PO TABS: 40.0000 mg | ORAL_TABLET | Freq: Every day | ORAL | 3 refills | Status: DC

## 2021-03-12 NOTE — Progress Notes (Signed)
° °  Subjective:    Patient ID: Michelle Pace, female    DOB: 05-Mar-1980, 41 y.o.   MRN: [id number]  HPI  Patient here for blood pressure issues.  Patient has history of gestational hypertension and postpartum hypertension.  Patient was prescribed nifedipine 60 mg daily for postpartum hypertension.  Patient was getting medication from OB/GYN however OB/GYN encourage patient to follow-up with PCP.  Patient takes her blood pressures at home states that blood pressures run 150/100 at home when not on medication.  Patient states that she experiences headaches when her blood pressure is high.  Patient denies any chest pain, palpitations, or changes to vision.  Review of Systems  All other systems reviewed and are negative.     Objective:   Physical Exam Constitutional:      General: She is not in acute distress.    Appearance: Normal appearance. She is normal weight. She is not ill-appearing or toxic-appearing.  Cardiovascular:     Rate and Rhythm: Normal rate and regular rhythm.     Pulses: Normal pulses.     Heart sounds: Normal heart sounds.  Pulmonary:     Effort: Pulmonary effort is normal.     Breath sounds: Normal breath sounds.  Musculoskeletal:        General: Normal range of motion.  Skin:    Capillary Refill: Capillary refill takes less than 2 seconds.  Neurological:     General: No focal deficit present.     Mental Status: She is alert and oriented to person, place, and time.  Psychiatric:        Mood and Affect: Mood normal.        Behavior: Behavior normal.          Assessment & Plan:   1. Primary hypertension -Blood pressure today 114/83.  Blood pressure is within goal with nifedipine 60 mg. -We will start patient on ARB per guidelines - valsartan (DIOVAN) 40 MG tablet; Take 1 tablet (40 mg total) by mouth daily.  Dispense: 90 tablet; Refill: 3 -Discussed signs and symptoms of hypotension.  Patient to continue to monitor blood pressures at home.  Notify  provider if blood pressures are consistently under 110/70 and symptomatic.  -Patient to return to clinic in 4 weeks for blood pressure recheck and for labs (CMP, urine creatinine albumin, Lipid panel).

## 2021-04-09 ENCOUNTER — Ambulatory Visit: Payer: Medicaid Other | Admitting: Nurse Practitioner

## 2021-04-09 ENCOUNTER — Encounter: Payer: Self-pay | Admitting: Nurse Practitioner

## 2021-04-13 ENCOUNTER — Encounter: Payer: Self-pay | Admitting: Nurse Practitioner

## 2021-04-13 ENCOUNTER — Ambulatory Visit: Payer: Medicaid Other | Admitting: Nurse Practitioner

## 2021-04-16 ENCOUNTER — Ambulatory Visit: Payer: Medicaid Other | Admitting: Nurse Practitioner

## 2021-04-16 ENCOUNTER — Encounter: Payer: Self-pay | Admitting: Nurse Practitioner

## 2021-05-01 ENCOUNTER — Encounter: Payer: Self-pay | Admitting: Family Medicine

## 2021-05-01 ENCOUNTER — Ambulatory Visit: Payer: Medicaid Other | Admitting: Family Medicine

## 2021-05-01 VITALS — BP 142/86 | HR 82 | Ht 60.0 in | Wt 170.6 lb

## 2021-05-01 DIAGNOSIS — O165 Unspecified maternal hypertension, complicating the puerperium: Secondary | ICD-10-CM | POA: Diagnosis not present

## 2021-05-01 DIAGNOSIS — J069 Acute upper respiratory infection, unspecified: Secondary | ICD-10-CM | POA: Diagnosis not present

## 2021-05-01 DIAGNOSIS — E559 Vitamin D deficiency, unspecified: Secondary | ICD-10-CM | POA: Diagnosis not present

## 2021-05-01 DIAGNOSIS — Z7689 Persons encountering health services in other specified circumstances: Secondary | ICD-10-CM | POA: Diagnosis not present

## 2021-05-01 NOTE — Assessment & Plan Note (Addendum)
-  Likely viral etiology ?-Encouraged supportive care and symptomatic management ?-State that her tonsils have always been large, with no complaints voiced ? ?

## 2021-05-01 NOTE — Patient Instructions (Addendum)
I appreciate the opportunity to provide care to you today! ? ?-Stop by the lab drawn today ? ?- Please take mucinex OTC and your tessalon to help cough ?  ? ?You're AWESOME! ? ? ?  ?It was a pleasure to see you and I look forward to continuing to work together on your health and well-being. ?Please do not hesitate to call the office if you need care or have questions about your care. ?  ?Have a wonderful day and week. ?With Gratitude, ?Gilmore Laroche MSN, FNP-BC ? ?

## 2021-05-01 NOTE — Assessment & Plan Note (Signed)
-  BP elevated today at 142/86 ?-Admits to not taking medication today ?-Stated that she no longer takes nifedipine due to its side effects ?-Takes valsartan only ?

## 2021-05-01 NOTE — Progress Notes (Signed)
? ?New Patient Office Visit ? ?Subjective:  ?Patient ID: Michelle Pace, female    DOB: 07-24-1980  Age: 41 y.o. MRN: [id number] ? ?CC:  ?Chief Complaint  ?Patient presents with  ? New Patient (Initial Visit)  ?  Want to establish care, would like to discuss bp medication.   ? ? ?HPI ?Michelle Pace is a 41 y.o. female with a past medical history of chronic hypertension, BTL, and panic attacks, presents for establishing care. She has no complaints or concerns today. She states that she has been having cold symptoms, which are cough and congestion for two days. The pain denies fever, chills, headache, nausea, and vomiting.  ? ? ?Past Medical History:  ?Diagnosis Date  ? Hypertension   ? Panic attacks   ? ? ?Past Surgical History:  ?Procedure Laterality Date  ? TUBAL LIGATION N/A 07/14/2020  ? Procedure: POST PARTUM TUBAL LIGATION;  Surgeon: Clarnce Flock, MD;  Location: [facility] LD [facility];  Service: Gynecology;  Laterality: N/A;  ? WISDOM TOOTH EXTRACTION  11/2011  ? ? ?Family History  ?Problem Relation Age of Onset  ? Stroke Maternal Grandmother   ? Cancer Father   ? Other Mother   ?     brain tumor  ? Asthma Son   ? ? ?Social History  ? ?Socioeconomic History  ? Marital status: Single  ?  Spouse name: Not on file  ? Number of children: Not on file  ? Years of education: Not on file  ? Highest education level: Not on file  ?Occupational History  ? Not on file  ?Tobacco Use  ? Smoking status: Never  ? Smokeless tobacco: Never  ?Vaping Use  ? Vaping Use: Never used  ?Substance and Sexual Activity  ? Alcohol use: No  ? Drug use: No  ? Sexual activity: Yes  ?  Birth control/protection: Surgical  ?  Comment: tubal ligation  ?Other Topics Concern  ? Not on file  ?Social History Narrative  ? Not on file  ? ?Social Determinants of Health  ? ?Financial Resource Strain: Not on file  ?Food Insecurity: Not on file  ?Transportation Needs: Not on file  ?Physical Activity: Not on file  ?Stress: Not on file  ?Social Connections: Not  on file  ?Intimate Partner Violence: Not on file  ? ? ?ROS ?Review of Systems  ?Constitutional:  Negative for chills, fatigue and fever.  ?HENT:  Positive for congestion. Negative for rhinorrhea, sinus pressure, sinus pain and sore throat.   ?Eyes:  Negative for pain, redness and itching.  ?Respiratory:  Positive for cough. Negative for chest tightness and shortness of breath.   ?Cardiovascular:  Negative for chest pain, palpitations and leg swelling.  ?Gastrointestinal:  Negative for constipation, diarrhea and vomiting.  ?Endocrine: Negative for polydipsia, polyphagia and polyuria.  ?Genitourinary:  Negative for dysuria, urgency and vaginal bleeding.  ?Musculoskeletal:  Negative for back pain, joint swelling and neck pain.  ?Skin:  Negative for rash and wound.  ?Allergic/Immunologic: Negative for food allergies.  ?Neurological:  Negative for dizziness, numbness and headaches.  ?Psychiatric/Behavioral:  Negative for confusion, self-injury and suicidal ideas.   ? ?Objective:  ? ?Today's Vitals: BP (!) 142/86 (BP Location: Left Arm, Patient Position: Sitting)   Pulse 82   Ht 5' (1.524 m)   Wt 170 lb 9.6 oz (77.4 kg)   SpO2 99%   BMI 33.32 kg/m?  ? ?Physical Exam ?Constitutional:   ?   Appearance: Normal appearance.  ?HENT:  ?  Head: Normocephalic.  ?   Right Ear: External ear normal.  ?   Left Ear: External ear normal.  ?   Nose: Congestion present. No rhinorrhea.  ?   Mouth/Throat:  ?   Lips: Pink. No lesions.  ?   Mouth: Mucous membranes are moist.  ?   Dentition: Dental caries present.  ?   Tongue: No lesions. Tongue does not deviate from midline.  ?   Pharynx: Uvula midline.  ?   Tonsils: No tonsillar exudate or tonsillar abscesses. 3+ on the right. 3+ on the left.  ?Eyes:  ?   General:     ?   Right eye: No discharge.     ?   Left eye: No discharge.  ?   Extraocular Movements: Extraocular movements intact.  ?   Pupils: Pupils are equal, round, and reactive to light.  ?Cardiovascular:  ?   Rate and Rhythm:  Normal rate and regular rhythm.  ?   Pulses: Normal pulses.  ?   Heart sounds: Normal heart sounds.  ?Pulmonary:  ?   Effort: Pulmonary effort is normal.  ?   Breath sounds: Normal breath sounds.  ?Abdominal:  ?   Tenderness: There is no right CVA tenderness or left CVA tenderness.  ?Musculoskeletal:     ?   General: No swelling or tenderness.  ?   Cervical back: Normal range of motion.  ?Skin: ?   Capillary Refill: Capillary refill takes less than 2 seconds.  ?   Findings: No bruising or erythema.  ?Neurological:  ?   Mental Status: She is alert and oriented to person, place, and time.  ?Psychiatric:  ?   Comments: Normal affect  ? ? ?Assessment & Plan:  ? ?Problem List Items Addressed This Visit   ? ?  ? Cardiovascular and Mediastinum  ? Postpartum hypertension  ?  -BP elevated today at 142/86 ?-Admits to not taking medication today ?-Stated that she no longer takes nifedipine due to its side effects ?-Takes valsartan only ?  ?  ?  ? Respiratory  ? URI (upper respiratory infection) - Primary  ?  -Likely viral etiology ?-Encouraged supportive care and symptomatic management ?-State that her tonsils have always been large, with no complaints voiced ? ?  ?  ?  ? Other  ? Encounter to establish care  ?  -Physical examination performed ?-Pending labs ? ? ?  ?  ? Relevant Orders  ? CBC with Differential/Platelet  ? CMP14+EGFR  ? Hemoglobin A1C  ? TSH + free T4  ? Lipid Profile  ? ?Other Visit Diagnoses   ? ? Vitamin D deficiency      ? Relevant Orders  ? Vitamin D (25 hydroxy)  ? ?  ? ? ?Outpatient Encounter Medications as of 05/01/2021  ?Medication Sig  ? escitalopram (LEXAPRO) 20 MG tablet Take 1 tablet (20 mg total) by mouth daily.  ? valsartan (DIOVAN) 40 MG tablet Take 1 tablet (40 mg total) by mouth daily.  ? acetaminophen (TYLENOL) 500 MG tablet Take 2 tablets (1,000 mg total) by mouth every 6 (six) hours as needed for mild pain, moderate pain, fever or headache. (Patient not taking: Reported on 05/01/2021)  ?  benzonatate (TESSALON) 100 MG capsule Take 1-2 capsules (100-200 mg total) by mouth 3 (three) times daily as needed for cough. (Patient not taking: Reported on 05/01/2021)  ? cetirizine (ZYRTEC ALLERGY) 10 MG tablet Take 1 tablet (10 mg total) by mouth daily. (Patient not taking: Reported  on 05/01/2021)  ? NIFEdipine (ADALAT CC) 60 MG 24 hr tablet Take 1 tablet (60 mg total) by mouth daily. (Patient not taking: Reported on 05/01/2021)  ? promethazine-dextromethorphan (PROMETHAZINE-DM) 6.25-15 MG/5ML syrup Take 5 mLs by mouth at bedtime as needed for cough. (Patient not taking: Reported on 05/01/2021)  ? pseudoephedrine (SUDAFED) 30 MG tablet Take 1 tablet (30 mg total) by mouth every 8 (eight) hours as needed for congestion. (Patient not taking: Reported on 05/01/2021)  ? ?No facility-administered encounter medications on file as of 05/01/2021.  ? ? ?Follow-up: Return in about 6 months (around 10/31/2021).  ? ?Alvira Monday, FNP ?

## 2021-05-01 NOTE — Assessment & Plan Note (Signed)
-  Physical examination performed ?-Pending labs ? ? ?

## 2021-05-02 LAB — HEMOGLOBIN A1C
Est. average glucose Bld gHb Est-mCnc: 105 mg/dL
Hgb A1c MFr Bld: 5.3 % (ref 4.8–5.6)

## 2021-05-02 LAB — CMP14+EGFR
ALT: 10 IU/L (ref 0–32)
AST: 13 IU/L (ref 0–40)
Albumin/Globulin Ratio: 1.5 (ref 1.2–2.2)
Albumin: 4.1 g/dL (ref 3.8–4.8)
Alkaline Phosphatase: 118 IU/L (ref 44–121)
BUN/Creatinine Ratio: 12 (ref 9–23)
BUN: 8 mg/dL (ref 6–20)
Bilirubin Total: 0.4 mg/dL (ref 0.0–1.2)
CO2: 21 mmol/L (ref 20–29)
Calcium: 9.2 mg/dL (ref 8.7–10.2)
Chloride: 104 mmol/L (ref 96–106)
Creatinine, Ser: 0.68 mg/dL (ref 0.57–1.00)
Globulin, Total: 2.8 g/dL (ref 1.5–4.5)
Glucose: 88 mg/dL (ref 70–99)
Potassium: 3.7 mmol/L (ref 3.5–5.2)
Sodium: 138 mmol/L (ref 134–144)
Total Protein: 6.9 g/dL (ref 6.0–8.5)
eGFR: 114 mL/min/{1.73_m2} (ref >59)

## 2021-05-02 LAB — CBC WITH DIFFERENTIAL/PLATELET
Basophils Absolute: 0 10*3/uL (ref 0.0–0.2)
Basos: 0 %
EOS (ABSOLUTE): 0.2 10*3/uL (ref 0.0–0.4)
Eos: 3 %
Hematocrit: 39.7 % (ref 34.0–46.6)
Hemoglobin: 13.3 g/dL (ref 11.1–15.9)
Immature Grans (Abs): 0 10*3/uL (ref 0.0–0.1)
Immature Granulocytes: 0 %
Lymphocytes Absolute: 2.7 10*3/uL (ref 0.7–3.1)
Lymphs: 29 %
MCH: 29.2 pg (ref 26.6–33.0)
MCHC: 33.5 g/dL (ref 31.5–35.7)
MCV: 87 fL (ref 79–97)
Monocytes Absolute: 0.6 10*3/uL (ref 0.1–0.9)
Monocytes: 7 %
Neutrophils Absolute: 5.6 10*3/uL (ref 1.4–7.0)
Neutrophils: 61 %
Platelets: 256 10*3/uL (ref 150–450)
RBC: 4.56 x10E6/uL (ref 3.77–5.28)
RDW: 13.1 % (ref 11.7–15.4)
WBC: 9.2 10*3/uL (ref 3.4–10.8)

## 2021-05-02 LAB — TSH+FREE T4
Free T4: 1.05 ng/dL (ref 0.82–1.77)
TSH: 1.3 u[IU]/mL (ref 0.450–4.500)

## 2021-05-02 LAB — LIPID PANEL
Chol/HDL Ratio: 4.1 ratio (ref 0.0–4.4)
Cholesterol, Total: 186 mg/dL (ref 100–199)
HDL: 45 mg/dL (ref >39)
LDL Chol Calc (NIH): 121 mg/dL — ABNORMAL HIGH (ref 0–99)
Triglycerides: 113 mg/dL (ref 0–149)
VLDL Cholesterol Cal: 20 mg/dL (ref 5–40)

## 2021-05-02 LAB — VITAMIN D 25 HYDROXY (VIT D DEFICIENCY, FRACTURES): Vit D, 25-Hydroxy: 22 ng/mL — ABNORMAL LOW (ref 30.0–100.0)

## 2021-05-05 ENCOUNTER — Telehealth: Payer: Self-pay | Admitting: Family Medicine

## 2021-05-05 NOTE — Telephone Encounter (Signed)
Patient called in for lab results  ?

## 2021-05-12 ENCOUNTER — Other Ambulatory Visit: Payer: Self-pay

## 2021-05-31 ENCOUNTER — Encounter: Payer: Self-pay | Admitting: Family Medicine

## 2021-06-01 ENCOUNTER — Ambulatory Visit (INDEPENDENT_AMBULATORY_CARE_PROVIDER_SITE_OTHER): Payer: Medicaid Other | Admitting: Internal Medicine

## 2021-06-01 ENCOUNTER — Encounter: Payer: Self-pay | Admitting: Internal Medicine

## 2021-06-01 DIAGNOSIS — S80862A Insect bite (nonvenomous), left lower leg, initial encounter: Secondary | ICD-10-CM

## 2021-06-01 DIAGNOSIS — L03116 Cellulitis of left lower limb: Secondary | ICD-10-CM

## 2021-06-01 DIAGNOSIS — W57XXXA Bitten or stung by nonvenomous insect and other nonvenomous arthropods, initial encounter: Secondary | ICD-10-CM | POA: Diagnosis not present

## 2021-06-01 MED ORDER — DOXYCYCLINE HYCLATE 100 MG PO TABS: 100.0000 mg | ORAL_TABLET | Freq: Two times a day (BID) | ORAL | 0 refills | Status: DC

## 2021-06-01 NOTE — Progress Notes (Signed)
?  ? ?Virtual Visit via Telephone Note  ? ?This visit type was conducted due to national recommendations for restrictions regarding the COVID-19 Pandemic (e.g. social distancing) in an effort to limit this patient's exposure and mitigate transmission in our community.  Due to her co-morbid illnesses, this patient is at least at moderate risk for complications without adequate follow up.  This format is felt to be most appropriate for this patient at this time.  The patient did not have access to video technology/had technical difficulties with video requiring transitioning to audio format only (telephone).  All issues noted in this document were discussed and addressed.  No physical exam could be performed with this format. ? ?Evaluation Performed:  Follow-up visit ? ?Date:  06/01/2021  ? ?ID:  Michelle Pace, DOB 03/16/1980, MRN [id number] ? ?Patient Location: Home ?Provider Location: Office/Clinic ? ?Participants: Patient ?Location of Patient: Home ?Location of Provider: Telehealth ?Consent was obtain for visit to be over via telehealth. ?I verified that I am speaking with the correct person using two identifiers. ? ?PCP:  Gilmore Laroche, FNP  ? ?Chief Complaint: Tick bite ? ?History of Present Illness:   ? ?Michelle Pace is a 41 y.o. female who has a televisit for c/o tick bite over left leg on 05/12.  She has had itching over the area.  She has worsening of redness and slight swelling over the bite area as well.  She had to pull the tick out, but does not recall how long the tick was attached.  She denies any fever, chills or generalized fatigue currently. ? ?The patient does not have symptoms concerning for COVID-19 infection (fever, chills, cough, or new shortness of breath).  ? ?Past Medical, Surgical, Social History, Allergies, and Medications have been Reviewed. ? ?Past Medical History:  ?Diagnosis Date  ? Hypertension   ? Panic attacks   ? ?Past Surgical History:  ?Procedure Laterality Date  ? TUBAL  LIGATION N/A 07/14/2020  ? Procedure: POST PARTUM TUBAL LIGATION;  Surgeon: Venora Maples, MD;  Location: [facility] LD ORS;  Service: Gynecology;  Laterality: N/A;  ? WISDOM TOOTH EXTRACTION  11/2011  ?  ? ?Current Meds  ?Medication Sig  ? acetaminophen (TYLENOL) 500 MG tablet Take 2 tablets (1,000 mg total) by mouth every 6 (six) hours as needed for mild pain, moderate pain, fever or headache.  ? escitalopram (LEXAPRO) 20 MG tablet Take 1 tablet (20 mg total) by mouth daily.  ?  ? ?Allergies:   Patient has no known allergies.  ? ?ROS:   ?Please see the history of present illness.    ? ?All other systems reviewed and are negative. ? ? ?Labs/Other Tests and Data Reviewed:   ? ?Recent Labs: ?05/01/2021: ALT 10; BUN 8; Creatinine, Ser 0.68; Hemoglobin 13.3; Platelets 256; Potassium 3.7; Sodium 138; TSH 1.300  ? ?Recent Lipid Panel ?Lab Results  ?Component Value Date/Time  ? CHOL 186 05/01/2021 11:06 AM  ? TRIG 113 05/01/2021 11:06 AM  ? HDL 45 05/01/2021 11:06 AM  ? CHOLHDL 4.1 05/01/2021 11:06 AM  ? CHOLHDL 3.0 11/21/2012 09:43 AM  ? LDLCALC 121 (H) 05/01/2021 11:06 AM  ? ? ?Wt Readings from Last 3 Encounters:  ?05/01/21 170 lb 9.6 oz (77.4 kg)  ?03/12/21 165 lb 12.8 oz (75.2 kg)  ?11/26/20 155 lb (70.3 kg)  ?  ? ?ASSESSMENT & PLAN:   ? ?Tick bite, initial encounter ?Cellulitis of left leg ?Has redness and mild swelling of left leg after tick  bite - image reviewed from MyChart ?Started empiric Doxycycline ?Tylenol as needed for pain ?Neosporin as needed for local irritation ? ?Time:   ?Today, I have spent 9 minutes reviewing the chart, including problem list, medications, and with the patient with telehealth technology discussing the above problems. ? ? ?Medication Adjustments/Labs and Tests Ordered: ?Current medicines are reviewed at length with the patient today.  Concerns regarding medicines are outlined above.  ? ?Tests Ordered: ?No orders of the defined types were placed in this encounter. ? ? ?Medication  Changes: ?No orders of the defined types were placed in this encounter. ? ? ? ?Note: This dictation was prepared with Dragon dictation along with smaller phrase technology. Similar sounding words can be transcribed inadequately or may not be corrected upon review. Any transcriptional errors that result from this process are unintentional.  ?  ? ? ?Disposition:  Follow up  ?Signed, ?Anabel Halon, MD  ?06/01/2021 3:11 PM    ? ?[facility] ?[facility] ?

## 2021-11-02 ENCOUNTER — Ambulatory Visit: Payer: Medicaid Other | Admitting: Family Medicine

## 2021-11-05 ENCOUNTER — Ambulatory Visit: Payer: Medicaid Other | Admitting: Family Medicine

## 2021-11-23 ENCOUNTER — Ambulatory Visit (INDEPENDENT_AMBULATORY_CARE_PROVIDER_SITE_OTHER): Payer: Medicaid Other | Admitting: Family Medicine

## 2021-11-23 ENCOUNTER — Encounter: Payer: Self-pay | Admitting: Family Medicine

## 2021-11-23 VITALS — BP 118/72 | HR 89 | Ht 60.0 in | Wt 166.0 lb

## 2021-11-23 DIAGNOSIS — I10 Essential (primary) hypertension: Secondary | ICD-10-CM

## 2021-11-23 DIAGNOSIS — E038 Other specified hypothyroidism: Secondary | ICD-10-CM

## 2021-11-23 DIAGNOSIS — E559 Vitamin D deficiency, unspecified: Secondary | ICD-10-CM

## 2021-11-23 DIAGNOSIS — Z2821 Immunization not carried out because of patient refusal: Secondary | ICD-10-CM | POA: Diagnosis not present

## 2021-11-23 DIAGNOSIS — R7301 Impaired fasting glucose: Secondary | ICD-10-CM

## 2021-11-23 DIAGNOSIS — O165 Unspecified maternal hypertension, complicating the puerperium: Secondary | ICD-10-CM

## 2021-11-23 DIAGNOSIS — E7849 Other hyperlipidemia: Secondary | ICD-10-CM

## 2021-11-23 DIAGNOSIS — F410 Panic disorder [episodic paroxysmal anxiety] without agoraphobia: Secondary | ICD-10-CM

## 2021-11-23 NOTE — Progress Notes (Signed)
Established Patient Office Visit  Subjective:  Patient ID: Michelle Pace, female    DOB: September 20, 1980  Age: 41 y.o. MRN: [id number]  CC:  Chief Complaint  Patient presents with   Follow-up    6 month f/u.     HPI Michelle Pace is a 41 y.o. female with past medical history of  presents for f/u of hypertension and panic attacks chronic medical conditions.  Hypertension: She takes valsartan 40 mg daily.  She reports compliance with treatment regiment.  She denies headaches, dizziness, blurred vision, chest pain, and shortness of breath.  Panic attacks: She takes Lexapro 20 mg daily.  She denies recent episode of panic attacks and thoughts of harming herself.  She reports compliance with treatment regimen.   Past Medical History:  Diagnosis Date   Hypertension    Panic attacks     Past Surgical History:  Procedure Laterality Date   TUBAL LIGATION N/A 07/14/2020   Procedure: POST PARTUM TUBAL LIGATION;  Surgeon: Clarnce Flock, MD;  Location: [facility];  Service: Gynecology;  Laterality: N/A;   WISDOM TOOTH EXTRACTION  11/2011    Family History  Problem Relation Age of Onset   Stroke Maternal Grandmother    Cancer Father    Other Mother        brain tumor   Asthma Son     Social History   Socioeconomic History   Marital status: Single    Spouse name: Not on file   Number of children: Not on file   Years of education: Not on file   Highest education level: Not on file  Occupational History   Not on file  Tobacco Use   Smoking status: Never   Smokeless tobacco: Never  Vaping Use   Vaping Use: Never used  Substance and Sexual Activity   Alcohol use: No   Drug use: No   Sexual activity: Yes    Birth control/protection: Surgical    Comment: tubal ligation  Other Topics Concern   Not on file  Social History Narrative   Not on file   Social Determinants of Health   Financial Resource Strain: Low Risk  (04/23/2020)   Overall Financial Resource Strain  (CARDIA)    Difficulty of Paying Living Expenses: Not hard at all  Food Insecurity: No Food Insecurity (04/23/2020)   Hunger Vital Sign    Worried About Running Out of Food in the Last Year: Never true    [place] in the Last Year: Never true  Transportation Needs: No Transportation Needs (04/23/2020)   PRAPARE - Hydrologist (Medical): No    Lack of Transportation (Non-Medical): No  Physical Activity: Insufficiently Active (04/23/2020)   Exercise Vital Sign    Days of Exercise per Week: 3 days    Minutes of Exercise per Session: 10 min  Stress: Stress Concern Present (04/23/2020)   [place]    Feeling of Stress : To some extent  Social Connections: Moderately Isolated (04/23/2020)   Social Connection and Isolation Panel [NHANES]    Frequency of Communication with Friends and Family: More than three times a week    Frequency of Social Gatherings with Friends and Family: Once a week    Attends Religious Services: Never    Marine scientist or Organizations: No    Attends Archivist Meetings: Never    Marital Status: Living with partner  Intimate Partner Violence: Not At Risk (04/23/2020)   Humiliation, Afraid, Rape, and Kick questionnaire    Fear of Current or Ex-Partner: No    Emotionally Abused: No    Physically Abused: No    Sexually Abused: No    Outpatient Medications Prior to Visit  Medication Sig Dispense Refill   acetaminophen (TYLENOL) 500 MG tablet Take 2 tablets (1,000 mg total) by mouth every 6 (six) hours as needed for mild pain, moderate pain, fever or headache. 30 tablet 0   escitalopram (LEXAPRO) 20 MG tablet Take 1 tablet (20 mg total) by mouth daily. 30 tablet 11   valsartan (DIOVAN) 40 MG tablet Take 1 tablet (40 mg total) by mouth daily. 90 tablet 3   doxycycline (VIBRA-TABS) 100 MG tablet Take 1 tablet (100 mg total) by mouth 2 (two) times daily. 14  tablet 0   No facility-administered medications prior to visit.    No Known Allergies  ROS Review of Systems  Constitutional:  Negative for fatigue and fever.  Eyes:  Negative for photophobia and visual disturbance.  Respiratory:  Negative for chest tightness and shortness of breath.   Cardiovascular:  Negative for chest pain and palpitations.  Neurological:  Negative for dizziness, light-headedness and headaches.      Objective:    Physical Exam HENT:     Head: Normocephalic.  Cardiovascular:     Rate and Rhythm: Normal rate and regular rhythm.     Pulses: Normal pulses.     Heart sounds: Normal heart sounds.  Pulmonary:     Effort: Pulmonary effort is normal.     Breath sounds: Normal breath sounds.  Musculoskeletal:     Cervical back: No rigidity.  Neurological:     Mental Status: She is alert.     BP 118/72   Pulse 89   Ht 5' (1.524 m)   Wt 166 lb (75.3 kg)   LMP 10/26/2021   SpO2 99%   BMI 32.42 kg/m  Wt Readings from Last 3 Encounters:  11/23/21 166 lb (75.3 kg)  05/01/21 170 lb 9.6 oz (77.4 kg)  03/12/21 165 lb 12.8 oz (75.2 kg)    Lab Results  Component Value Date   TSH 1.300 05/01/2021   Lab Results  Component Value Date   WBC 9.2 05/01/2021   HGB 13.3 05/01/2021   HCT 39.7 05/01/2021   MCV 87 05/01/2021   PLT 256 05/01/2021   Lab Results  Component Value Date   NA 138 05/01/2021   K 3.7 05/01/2021   CO2 21 05/01/2021   GLUCOSE 88 05/01/2021   BUN 8 05/01/2021   CREATININE 0.68 05/01/2021   BILITOT 0.4 05/01/2021   ALKPHOS 118 05/01/2021   AST 13 05/01/2021   ALT 10 05/01/2021   PROT 6.9 05/01/2021   ALBUMIN 4.1 05/01/2021   CALCIUM 9.2 05/01/2021   ANIONGAP 9 07/13/2020   EGFR 114 05/01/2021   Lab Results  Component Value Date   CHOL 186 05/01/2021   Lab Results  Component Value Date   HDL 45 05/01/2021   Lab Results  Component Value Date   LDLCALC 121 (H) 05/01/2021   Lab Results  Component Value Date   TRIG 113  05/01/2021   Lab Results  Component Value Date   CHOLHDL 4.1 05/01/2021   Lab Results  Component Value Date   HGBA1C 5.3 05/01/2021      Assessment & Plan:   Problem List Items Addressed This Visit  Cardiovascular and Mediastinum   Postpartum hypertension    Controlled Encouraged to continue taking valsartan 40 mg daily No changes to treatment regimen We will get basic labs today including her CMP and CBC BP Readings from Last 3 Encounters:  11/23/21 118/72  05/01/21 (!) 142/86  03/12/21 114/83           Other   Panic attacks    Controlled She takes Lexapro 20 mg daily and reports relief of her symptoms No changes to treatment regimen Encouraged to continue taking Lexapro 20 mg daily      Other Visit Diagnoses     Primary hypertension    -  Primary   Relevant Orders   CBC with Differential/Platelet   CMP14+EGFR   Influenza vaccine refused       Vitamin D deficiency       Relevant Orders   Vitamin D (25 hydroxy)   Other specified hypothyroidism       Relevant Orders   TSH + free T4   IFG (impaired fasting glucose)       Relevant Orders   Hemoglobin A1c   Other hyperlipidemia       Relevant Orders   Lipid panel       No orders of the defined types were placed in this encounter.   Follow-up: Return in about 6 months (around 05/24/2022).    Alvira Monday, FNP

## 2021-11-23 NOTE — Patient Instructions (Signed)
I appreciate the opportunity to provide care to you today!    Follow up:  6 months  Labs: please stop by the lab during the week to get your blood drawn (CBC, CMP, TSH, Lipid profile, HgA1c, Vit D)     Please continue to a heart-healthy diet and increase your physical activities. Try to exercise for 53mins at least three times a week.      It was a pleasure to see you and I look forward to continuing to work together on your health and well-being. Please do not hesitate to call the office if you need care or have questions about your care.   Have a wonderful day and week. With Gratitude, Alvira Monday MSN, FNP-BC

## 2021-11-23 NOTE — Assessment & Plan Note (Signed)
Controlled She takes Lexapro 20 mg daily and reports relief of her symptoms No changes to treatment regimen Encouraged to continue taking Lexapro 20 mg daily

## 2021-11-23 NOTE — Assessment & Plan Note (Signed)
Controlled Encouraged to continue taking valsartan 40 mg daily No changes to treatment regimen We will get basic labs today including her CMP and CBC BP Readings from Last 3 Encounters:  11/23/21 118/72  05/01/21 (!) 142/86  03/12/21 114/83

## 2021-12-23 ENCOUNTER — Encounter: Payer: Self-pay | Admitting: Internal Medicine

## 2021-12-23 ENCOUNTER — Ambulatory Visit: Payer: Medicaid Other | Admitting: Internal Medicine

## 2021-12-23 VITALS — BP 136/93 | HR 111 | Resp 16 | Ht 60.0 in | Wt 168.0 lb

## 2021-12-23 DIAGNOSIS — L989 Disorder of the skin and subcutaneous tissue, unspecified: Secondary | ICD-10-CM

## 2021-12-23 NOTE — Patient Instructions (Signed)
Thank you for trusting me with your care. To recap, today we discussed the following:   Skin lesions - Ambulatory referral to Dermatology

## 2021-12-24 DIAGNOSIS — L989 Disorder of the skin and subcutaneous tissue, unspecified: Secondary | ICD-10-CM | POA: Insufficient documentation

## 2021-12-24 NOTE — Progress Notes (Signed)
     CC: Skin lesions  HPI:[name] is a 41 y.o. female who presents for evaluation of  two skin lesions. For the details of today's visit, please refer to the assessment and plan.   Past Medical History:  Diagnosis Date   Hypertension    Panic attacks      Physical Exam: Vitals:   12/23/21 1535  BP: (!) 136/93  Pulse: (!) 111  Resp: 16  SpO2: 97%  Weight: 168 lb (76.2 kg)  Height: 5' (1.524 m)     Physical Exam Constitutional:      Appearance: Normal appearance. She is well-developed.  Cardiovascular:     Rate and Rhythm: Normal rate and regular rhythm.  Skin:    Comments: In left eyebrow 1 cm in diameter, brown/red papule Right side of nose bridge, flesh colored .3 x.5 cm papule.        Assessment & Plan:   Skin lesions Patient presents for evaluation of two skin lesions. 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 She scratched it this week and has scabbed edge. Another flesh colored papule which has been present for 2 years. Located between her right eye and nose. She would like these removed. - Referral to dermatology    Milus Banister, MD

## 2021-12-24 NOTE — Assessment & Plan Note (Signed)
Patient presents for evaluation of two skin lesions. 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 She scratched it this week and has scabbed edge. Another flesh colored papule which has been present for 2 years. Located between her right eye and nose. She would like these removed. - Referral to dermatology

## 2021-12-26 ENCOUNTER — Other Ambulatory Visit: Payer: Self-pay | Admitting: Obstetrics & Gynecology

## 2022-01-29 ENCOUNTER — Encounter: Payer: Self-pay | Admitting: Emergency Medicine

## 2022-01-29 ENCOUNTER — Ambulatory Visit
Admission: EM | Admit: 2022-01-29 | Discharge: 2022-01-29 | Disposition: A | Discharge status: Home / Self Care | Payer: Medicaid Other | Attending: Family Medicine | Admitting: Family Medicine

## 2022-01-29 DIAGNOSIS — R52 Pain, unspecified: Secondary | ICD-10-CM | POA: Diagnosis present

## 2022-01-29 DIAGNOSIS — R509 Fever, unspecified: Secondary | ICD-10-CM | POA: Diagnosis present

## 2022-01-29 DIAGNOSIS — J069 Acute upper respiratory infection, unspecified: Secondary | ICD-10-CM | POA: Diagnosis not present

## 2022-01-29 DIAGNOSIS — U071 COVID-19: Secondary | ICD-10-CM | POA: Insufficient documentation

## 2022-01-29 DIAGNOSIS — R059 Cough, unspecified: Secondary | ICD-10-CM | POA: Diagnosis present

## 2022-01-29 MED ORDER — OSELTAMIVIR PHOSPHATE 75 MG PO CAPS: 75.0000 mg | ORAL_CAPSULE | Freq: Two times a day (BID) | ORAL | 0 refills | Status: DC

## 2022-01-29 MED ORDER — FLUTICASONE PROPIONATE 50 MCG/ACT NA SUSP: 1.0000 | Freq: Two times a day (BID) | NASAL | 2 refills | Status: DC

## 2022-01-29 MED ORDER — BENZONATATE 100 MG PO CAPS: 100.0000 mg | ORAL_CAPSULE | Freq: Three times a day (TID) | ORAL | 0 refills | Status: DC

## 2022-01-29 NOTE — Discharge Instructions (Signed)
You may take Flonase nasal spray, plain Mucinex, Coricidin HBP anytime you get cough or congestion symptoms and these medications will not affect your blood pressure.  I suspect that you have influenza so we are starting the Tamiflu but we are also testing you for COVID to make sure that it is not COVID.  These results should be back tomorrow and available to you.

## 2022-01-29 NOTE — ED Triage Notes (Signed)
Body aches on Tuesday, cough, and nasal congestion.  has been taking cold and flu medication with some relief.

## 2022-01-29 NOTE — ED Provider Notes (Signed)
[facility]    CSN: [id number] Arrival date & time: 01/29/22  1721      History   Chief Complaint No chief complaint on file.   HPI Michelle Pace is a 42 y.o. female.   Patient presenting today with 3-day history of fever, chills, body aches, sweats, congestion, cough.  Denies sore throat, chest pain, shortness of breath, abdominal pain, nausea vomiting or diarrhea.  So far taking over-the-counter cold and congestion medications with mild relief.  No known sick contacts recently.  No known pertinent chronic medical problems.    Past Medical History:  Diagnosis Date   Hypertension    Panic attacks     Patient Active Problem List   Diagnosis Date Noted   Skin lesions 12/24/2021   URI (upper respiratory infection) 05/01/2021   Postpartum hypertension 07/23/2020   History of bilateral tubal ligation 07/14/2020   Chronic hypertension affecting pregnancy 04/02/2020   Panic attacks 01/16/2020    Past Surgical History:  Procedure Laterality Date   TUBAL LIGATION N/A 07/14/2020   Procedure: POST PARTUM TUBAL LIGATION;  Surgeon: Clarnce Flock, MD;  Location: [facility] LD [facility]S;  Service: Gynecology;  Laterality: N/A;   WISDOM TOOTH EXTRACTION  11/2011    OB History     Gravida  4   Para  3   Term  3   Preterm      AB  1   Living  3      SAB  1   IAB      Ectopic      Multiple  0   Live Births  3            Home Medications    Prior to Admission medications   Medication Sig Start Date End Date Taking? Authorizing Provider  benzonatate (TESSALON) 100 MG capsule Take 1 capsule (100 mg total) by mouth every 8 (eight) hours. 01/29/22  Yes Volney American, PA-C  fluticasone [facility]) 50 MCG/ACT nasal spray Place 1 spray into both nostrils 2 (two) times daily. 01/29/22  Yes Volney American, PA-C  oseltamivir (TAMIFLU) 75 MG capsule Take 1 capsule (75 mg total) by mouth every 12 (twelve) hours. 01/29/22  Yes Volney American,  PA-C  acetaminophen (TYLENOL) 500 MG tablet Take 2 tablets (1,000 mg total) by mouth every 6 (six) hours as needed for mild pain, moderate pain, fever or headache. 07/15/20   Randa Ngo, MD  escitalopram (LEXAPRO) 20 MG tablet TAKE 1 TABLET(20 MG) BY MOUTH DAILY 12/27/21   Florian Buff, MD  valsartan (DIOVAN) 40 MG tablet Take 1 tablet (40 mg total) by mouth daily. 03/12/21   [employer], [place]on Gammon, FNP    Family History Family History  Problem Relation Age of Onset   Stroke Maternal Grandmother    Cancer Father    Other Mother        brain tumor   Asthma Son     Social History Social History   Tobacco Use   Smoking status: Never   Smokeless tobacco: Never  Vaping Use   Vaping Use: Never used  Substance Use Topics   Alcohol use: No   Drug use: No     Allergies   Patient has no known allergies.   Review of Systems Review of Systems Per HPI  Physical Exam Triage Vital Signs ED Triage Vitals  Enc Vitals Group     BP 01/29/22 1729 (!) 136/93     Pulse Rate 01/29/22  1729 (!) 101     Resp 01/29/22 1729 18     Temp 01/29/22 1729 98 F (36.7 C)     Temp Source 01/29/22 1729 Oral     SpO2 01/29/22 1729 98 %     Weight --      Height --      Head Circumference --      Peak Flow --      Pain Score 01/29/22 1730 4     Pain Loc --      Pain Edu? --      Excl. in GC? --    No data found.  Updated Vital Signs BP (!) 136/93 (BP Location: Right Arm)   Pulse (!) 101   Temp 98 F (36.7 C) (Oral)   Resp 18   LMP 01/03/2022 (Approximate)   SpO2 98%   Visual Acuity Right Eye Distance:   Left Eye Distance:   Bilateral Distance:    Right Eye Near:   Left Eye Near:    Bilateral Near:     Physical Exam Vitals and nursing note reviewed.  Constitutional:      Appearance: Normal appearance.  HENT:     Head: Atraumatic.     Right Ear: Tympanic membrane and external ear normal.     Left Ear: Tympanic membrane and external ear normal.     Nose: Rhinorrhea  present.     Mouth/Throat:     Mouth: Mucous membranes are moist.     Pharynx: Posterior oropharyngeal erythema present.  Eyes:     Extraocular Movements: Extraocular movements intact.     Conjunctiva/sclera: Conjunctivae normal.  Cardiovascular:     Rate and Rhythm: Normal rate and regular rhythm.     Heart sounds: Normal heart sounds.  Pulmonary:     Effort: Pulmonary effort is normal.     Breath sounds: Normal breath sounds. No wheezing or rales.  Musculoskeletal:        General: Normal range of motion.     Cervical back: Normal range of motion and neck supple.  Skin:    General: Skin is warm and dry.  Neurological:     Mental Status: She is alert and oriented to person, place, and time.  Psychiatric:        Mood and Affect: Mood normal.        Thought Content: Thought content normal.      UC Treatments / Results  Labs (all labs ordered are listed, but only abnormal results are displayed) Labs Reviewed  SARS CORONAVIRUS 2 (TAT 6-24 HRS)    EKG   Radiology No results found.  Procedures Procedures (including critical care time)  Medications Ordered in UC Medications - No data to display  Initial Impression / Assessment and Plan / UC Course  I have reviewed the triage vital signs and the nursing notes.  Pertinent labs & imaging results that were available during my care of the patient were reviewed by me and considered in my medical decision making (see chart for details).     Overall vitals and exam reassuring and suggestive of a viral respiratory infection, likely influenza.  COVID testing pending for rule out, treat with Tamiflu, Tessalon, Flonase and list given for blood pressure safe over-the-counter medications per her request.  Return for worsening symptoms.  Final Clinical Impressions(s) / UC Diagnoses   Final diagnoses:  Viral URI with cough  Generalized body aches     Discharge Instructions      You may  take Flonase nasal spray, plain  Mucinex, Coricidin HBP anytime you get cough or congestion symptoms and these medications will not affect your blood pressure.  I suspect that you have influenza so we are starting the Tamiflu but we are also testing you for COVID to make sure that it is not COVID.  These results should be back tomorrow and available to you.    ED Prescriptions     Medication Sig Dispense Auth. Provider   oseltamivir (TAMIFLU) 75 MG capsule Take 1 capsule (75 mg total) by mouth every 12 (twelve) hours. 10 capsule Volney American, PA-C   benzonatate (TESSALON) 100 MG capsule Take 1 capsule (100 mg total) by mouth every 8 (eight) hours. 21 capsule Volney American, PA-C   fluticasone [facility]) 50 MCG/ACT nasal spray Place 1 spray into both nostrils 2 (two) times daily. 16 g Volney American, [place]      PDMP not reviewed this encounter.   Volney American, [place] 01/29/22 1802

## 2022-01-31 LAB — SARS CORONAVIRUS 2 (TAT 6-24 HRS): SARS Coronavirus 2: POSITIVE — AB

## 2022-03-16 ENCOUNTER — Encounter: Payer: Self-pay | Admitting: Family Medicine

## 2022-03-16 ENCOUNTER — Other Ambulatory Visit: Payer: Self-pay

## 2022-03-16 DIAGNOSIS — I10 Essential (primary) hypertension: Secondary | ICD-10-CM

## 2022-03-16 MED ORDER — VALSARTAN 40 MG PO TABS: 40.0000 mg | ORAL_TABLET | Freq: Every day | ORAL | 3 refills | Status: DC

## 2022-05-25 ENCOUNTER — Ambulatory Visit (INDEPENDENT_AMBULATORY_CARE_PROVIDER_SITE_OTHER): Payer: Medicaid Other | Admitting: Family Medicine

## 2022-05-25 ENCOUNTER — Encounter: Payer: Self-pay | Admitting: Family Medicine

## 2022-05-25 VITALS — BP 122/90 | HR 115 | Wt 162.1 lb

## 2022-05-25 DIAGNOSIS — E7849 Other hyperlipidemia: Secondary | ICD-10-CM

## 2022-05-25 DIAGNOSIS — E559 Vitamin D deficiency, unspecified: Secondary | ICD-10-CM | POA: Diagnosis not present

## 2022-05-25 DIAGNOSIS — R7301 Impaired fasting glucose: Secondary | ICD-10-CM | POA: Diagnosis not present

## 2022-05-25 DIAGNOSIS — R030 Elevated blood-pressure reading, without diagnosis of hypertension: Secondary | ICD-10-CM | POA: Insufficient documentation

## 2022-05-25 DIAGNOSIS — E038 Other specified hypothyroidism: Secondary | ICD-10-CM

## 2022-05-25 DIAGNOSIS — I10 Essential (primary) hypertension: Secondary | ICD-10-CM | POA: Insufficient documentation

## 2022-05-25 DIAGNOSIS — R519 Headache, unspecified: Secondary | ICD-10-CM | POA: Insufficient documentation

## 2022-05-25 NOTE — Patient Instructions (Signed)
I appreciate the opportunity to provide care to you today!    Follow up:  2 weeks for BP  Labs: please stop by the lab during the week  to get your blood drawn (CBC, CMP, TSH, Lipid profile, HgA1c, Vit D)  Please check your blood pressure daily at home and bring your readings to check in next appointment I want your blood pressure less than 140/90 I recommend low-sodium diet with increased physical activity   Please continue to a heart-healthy diet and increase your physical activities. Try to exercise for at least five days a week.      It was a pleasure to see you and I look forward to continuing to work together on your health and well-being. Please do not hesitate to call the office if you need care or have questions about your care.   Have a wonderful day and week. With Gratitude, Gilmore Laroche MSN, FNP-BC

## 2022-05-25 NOTE — Progress Notes (Signed)
Established Patient Office Visit  Subjective:  Patient ID: Michelle Pace, female    DOB: October 27, 1980  Age: 42 y.o. MRN: [id number]  CC:  Chief Complaint  Patient presents with   Chronic Care Management    6 month f/u.    Headache    Pt reports ongoing headaches on and off for about a month now. 04/25/22.    HPI Michelle Pace is a 42 y.o. female with past medical history of panic attacks presents for f/u. For the details of today's visit, please refer to the assessment and plan.     Past Medical History:  Diagnosis Date   Hypertension    Panic attacks     Past Surgical History:  Procedure Laterality Date   TUBAL LIGATION N/A 07/14/2020   Procedure: POST PARTUM TUBAL LIGATION;  Surgeon: Venora Maples, MD;  Location: [facility];  Service: Gynecology;  Laterality: N/A;   WISDOM TOOTH EXTRACTION  11/2011    Family History  Problem Relation Age of Onset   Stroke Maternal Grandmother    Cancer Father    Other Mother        brain tumor   Asthma Son     Social History   Socioeconomic History   Marital status: Single    Spouse name: Not on file   Number of children: Not on file   Years of education: Not on file   Highest education level: Not on file  Occupational History   Not on file  Tobacco Use   Smoking status: Never   Smokeless tobacco: Never  Vaping Use   Vaping Use: Never used  Substance and Sexual Activity   Alcohol use: No   Drug use: No   Sexual activity: Yes    Birth control/protection: Surgical    Comment: tubal ligation  Other Topics Concern   Not on file  Social History Narrative   Not on file   Social Determinants of Health   Financial Resource Strain: Low Risk  (04/23/2020)   Overall Financial Resource Strain (CARDIA)    Difficulty of Paying Living Expenses: Not hard at all  Food Insecurity: No Food Insecurity (04/23/2020)   Hunger Vital Sign    Worried About Running Out of Food in the Last Year: Never true    Ran Out of Food in  the Last Year: Never true  Transportation Needs: No Transportation Needs (04/23/2020)   PRAPARE - Administrator, Civil Service (Medical): No    Lack of Transportation (Non-Medical): No  Physical Activity: Insufficiently Active (04/23/2020)   Exercise Vital Sign    Days of Exercise per Week: 3 days    Minutes of Exercise per Session: 10 min  Stress: Stress Concern Present (04/23/2020)   [employer] of Occupational Health - Occupational Stress Questionnaire    Feeling of Stress : To some extent  Social Connections: Moderately Isolated (04/23/2020)   Social Connection and Isolation Panel [NHANES]    Frequency of Communication with Friends and Family: More than three times a week    Frequency of Social Gatherings with Friends and Family: Once a week    Attends Religious Services: Never    Database administrator or Organizations: No    Attends Banker Meetings: Never    Marital Status: Living with partner  Intimate Partner Violence: Not At Risk (04/23/2020)   Humiliation, Afraid, Rape, and Kick questionnaire    Fear of Current or Ex-Partner: No    Emotionally  Abused: No    Physically Abused: No    Sexually Abused: No    Outpatient Medications Prior to Visit  Medication Sig Dispense Refill   acetaminophen (TYLENOL) 500 MG tablet Take 2 tablets (1,000 mg total) by mouth every 6 (six) hours as needed for mild pain, moderate pain, fever or headache. 30 tablet 0   benzonatate (TESSALON) 100 MG capsule Take 1 capsule (100 mg total) by mouth every 8 (eight) hours. 21 capsule 0   escitalopram (LEXAPRO) 20 MG tablet TAKE 1 TABLET(20 MG) BY MOUTH DAILY 30 tablet 11   fluticasone (FLONASE) 50 MCG/ACT nasal spray Place 1 spray into both nostrils 2 (two) times daily. 16 g 2   oseltamivir (TAMIFLU) 75 MG capsule Take 1 capsule (75 mg total) by mouth every 12 (twelve) hours. 10 capsule 0   valsartan (DIOVAN) 40 MG tablet Take 1 tablet (40 mg total) by mouth daily. 90 tablet 3    No facility-administered medications prior to visit.    No Known Allergies  ROS Review of Systems  Constitutional:  Negative for chills and fever.  Eyes:  Negative for visual disturbance.  Respiratory:  Negative for chest tightness and shortness of breath.   Neurological:  Positive for headaches. Negative for dizziness.      Objective:    Physical Exam HENT:     Head: Normocephalic.     Mouth/Throat:     Mouth: Mucous membranes are moist.  Cardiovascular:     Rate and Rhythm: Normal rate.     Heart sounds: Normal heart sounds.  Pulmonary:     Effort: Pulmonary effort is normal.     Breath sounds: Normal breath sounds.  Neurological:     Mental Status: She is alert.     BP (!) 122/90 (BP Location: Left Arm)   Pulse (!) 115   Wt 162 lb 1.9 oz (73.5 kg)   SpO2 98%   BMI 31.66 kg/m  Wt Readings from Last 3 Encounters:  05/25/22 162 lb 1.9 oz (73.5 kg)  12/23/21 168 lb (76.2 kg)  11/23/21 166 lb (75.3 kg)    Lab Results  Component Value Date   TSH 1.300 05/01/2021   Lab Results  Component Value Date   WBC 9.2 05/01/2021   HGB 13.3 05/01/2021   HCT 39.7 05/01/2021   MCV 87 05/01/2021   PLT 256 05/01/2021   Lab Results  Component Value Date   NA 138 05/01/2021   K 3.7 05/01/2021   CO2 21 05/01/2021   GLUCOSE 88 05/01/2021   BUN 8 05/01/2021   CREATININE 0.68 05/01/2021   BILITOT 0.4 05/01/2021   ALKPHOS 118 05/01/2021   AST 13 05/01/2021   ALT 10 05/01/2021   PROT 6.9 05/01/2021   ALBUMIN 4.1 05/01/2021   CALCIUM 9.2 05/01/2021   ANIONGAP 9 07/13/2020   EGFR 114 05/01/2021   Lab Results  Component Value Date   CHOL 186 05/01/2021   Lab Results  Component Value Date   HDL 45 05/01/2021   Lab Results  Component Value Date   LDLCALC 121 (H) 05/01/2021   Lab Results  Component Value Date   TRIG 113 05/01/2021   Lab Results  Component Value Date   CHOLHDL 4.1 05/01/2021   Lab Results  Component Value Date   HGBA1C 5.3 05/01/2021       Assessment & Plan:  Elevated BP without diagnosis of hypertension Assessment & Plan: Uncontrolled today Complains of occasional headaches that is relieved with Advil Headache  is not pulsating or throbbing No red flag symptoms reported We will continue to monitor Encouraged to check her blood pressure daily and bring ambulatory readings with her at her next appointment No changes to her blood pressure regimen today BP Readings from Last 3 Encounters:  05/25/22 (!) 122/90  01/29/22 (!) 136/93  12/23/21 (!) 136/93      IFG (impaired fasting glucose) -     Hemoglobin A1c  Vitamin D deficiency -     VITAMIN D 25 Hydroxy (Vit-D Deficiency, Fractures)  Other specified hypothyroidism -     TSH + free T4  Other hyperlipidemia -     Lipid panel -     CMP14+EGFR -     CBC with Differential/Platelet    Follow-up: Return in about 2 weeks (around 06/08/2022) for BP.   Gilmore Laroche, FNP

## 2022-05-25 NOTE — Assessment & Plan Note (Signed)
Uncontrolled today Complains of occasional headaches that is relieved with Advil Headache is not pulsating or throbbing No red flag symptoms reported We will continue to monitor Encouraged to check her blood pressure daily and bring ambulatory readings with her at her next appointment No changes to her blood pressure regimen today BP Readings from Last 3 Encounters:  05/25/22 (!) 122/90  01/29/22 (!) 136/93  12/23/21 (!) 136/93

## 2022-05-28 ENCOUNTER — Encounter: Payer: Self-pay | Admitting: Family Medicine

## 2022-05-31 ENCOUNTER — Other Ambulatory Visit: Payer: Self-pay | Admitting: Family Medicine

## 2022-05-31 ENCOUNTER — Other Ambulatory Visit: Payer: Self-pay

## 2022-05-31 DIAGNOSIS — R030 Elevated blood-pressure reading, without diagnosis of hypertension: Secondary | ICD-10-CM

## 2022-05-31 MED ORDER — UNABLE TO FIND
0 refills | Status: AC
Start: 2022-05-31 — End: ?

## 2022-05-31 NOTE — Telephone Encounter (Signed)
Would you kindly fax the printed rx to her pharmacy

## 2022-06-01 LAB — LIPID PANEL
Chol/HDL Ratio: 3.9 ratio (ref 0.0–4.4)
Cholesterol, Total: 166 mg/dL (ref 100–199)
HDL: 43 mg/dL (ref >39)
LDL Chol Calc (NIH): 105 mg/dL — ABNORMAL HIGH (ref 0–99)
Triglycerides: 99 mg/dL (ref 0–149)
VLDL Cholesterol Cal: 18 mg/dL (ref 5–40)

## 2022-06-01 LAB — CBC WITH DIFFERENTIAL/PLATELET
Basophils Absolute: 0 10*3/uL (ref 0.0–0.2)
Basos: 1 %
EOS (ABSOLUTE): 0.2 10*3/uL (ref 0.0–0.4)
Eos: 2 %
Hematocrit: 40.5 % (ref 34.0–46.6)
Hemoglobin: 13.1 g/dL (ref 11.1–15.9)
Immature Grans (Abs): 0 10*3/uL (ref 0.0–0.1)
Immature Granulocytes: 0 %
Lymphocytes Absolute: 3.1 10*3/uL (ref 0.7–3.1)
Lymphs: 37 %
MCH: 29 pg (ref 26.6–33.0)
MCHC: 32.3 g/dL (ref 31.5–35.7)
MCV: 90 fL (ref 79–97)
Monocytes Absolute: 0.6 10*3/uL (ref 0.1–0.9)
Monocytes: 7 %
Neutrophils Absolute: 4.5 10*3/uL (ref 1.4–7.0)
Neutrophils: 53 %
Platelets: 253 10*3/uL (ref 150–450)
RBC: 4.52 x10E6/uL (ref 3.77–5.28)
RDW: 13.2 % (ref 11.7–15.4)
WBC: 8.4 10*3/uL (ref 3.4–10.8)

## 2022-06-01 LAB — TSH+FREE T4
Free T4: 1.22 ng/dL (ref 0.82–1.77)
TSH: 0.857 u[IU]/mL (ref 0.450–4.500)

## 2022-06-01 LAB — CMP14+EGFR
ALT: 10 IU/L (ref 0–32)
AST: 13 IU/L (ref 0–40)
Albumin/Globulin Ratio: 1.6 (ref 1.2–2.2)
Albumin: 4 g/dL (ref 3.9–4.9)
Alkaline Phosphatase: 96 IU/L (ref 44–121)
BUN/Creatinine Ratio: 12 (ref 9–23)
BUN: 8 mg/dL (ref 6–24)
Bilirubin Total: 0.4 mg/dL (ref 0.0–1.2)
CO2: 21 mmol/L (ref 20–29)
Calcium: 8.9 mg/dL (ref 8.7–10.2)
Chloride: 105 mmol/L (ref 96–106)
Creatinine, Ser: 0.68 mg/dL (ref 0.57–1.00)
Globulin, Total: 2.5 g/dL (ref 1.5–4.5)
Glucose: 82 mg/dL (ref 70–99)
Potassium: 3.9 mmol/L (ref 3.5–5.2)
Sodium: 139 mmol/L (ref 134–144)
Total Protein: 6.5 g/dL (ref 6.0–8.5)
eGFR: 113 mL/min/{1.73_m2} (ref >59)

## 2022-06-01 LAB — VITAMIN D 25 HYDROXY (VIT D DEFICIENCY, FRACTURES): Vit D, 25-Hydroxy: 36.2 ng/mL (ref 30.0–100.0)

## 2022-06-01 LAB — HEMOGLOBIN A1C
Est. average glucose Bld gHb Est-mCnc: 105 mg/dL
Hgb A1c MFr Bld: 5.3 % (ref 4.8–5.6)

## 2022-06-01 NOTE — Progress Notes (Signed)
Your LDL cholesterol has decreased from 1 21 to 105.  Keep up the good work.  I recommended to continue avoiding simple carbohydrates, including cakes, sweet desserts, ice cream, soda (diet or regular), sweet tea, candies, chips, cookies, store-bought juices, alcohol in excess of 1-2 drinks a day, lemonade, artificial sweeteners, donuts, coffee creamers, and sugar-free products.  I recommend avoiding greasy, fatty foods with increased physical activity.

## 2022-06-08 ENCOUNTER — Encounter: Payer: Self-pay | Admitting: Family Medicine

## 2022-06-08 ENCOUNTER — Other Ambulatory Visit: Payer: Self-pay | Admitting: Family Medicine

## 2022-06-08 ENCOUNTER — Ambulatory Visit (INDEPENDENT_AMBULATORY_CARE_PROVIDER_SITE_OTHER): Payer: Medicaid Other | Admitting: Family Medicine

## 2022-06-08 VITALS — BP 138/92 | HR 108 | Ht 60.0 in | Wt 164.1 lb

## 2022-06-08 DIAGNOSIS — I10 Essential (primary) hypertension: Secondary | ICD-10-CM

## 2022-06-08 MED ORDER — VALSARTAN 80 MG PO TABS
80.0000 mg | ORAL_TABLET | Freq: Every day | ORAL | 1 refills | Status: DC
Start: 2022-06-08 — End: 2022-08-23

## 2022-06-08 NOTE — Patient Instructions (Signed)
I appreciate the opportunity to provide care to you today!    Follow up:  1 month  Labs: please stop by the lab today to get your blood drawn (CBC, CMP, TSH, Lipid profile, HgA1c, Vit D)  Please pick up your medication at the pharmacy and start taking Valsartan 80 mg daily  Please continue to a heart-healthy diet and increase your physical activities. Try to exercise for at least five days a week.      It was a pleasure to see you and I look forward to continuing to work together on your health and well-being. Please do not hesitate to call the office if you need care or have questions about your care.   Have a wonderful day and week. With Gratitude, Gilmore Laroche MSN, FNP-BC

## 2022-06-08 NOTE — Assessment & Plan Note (Signed)
Uncontrolled The patient is taking Valsartan 40 mg daily Asymptomatic in the clinic We will discontinue valsartan 40 mg daily Encouraged to start taking valsartan 80 mg daily Low-sodium diet with increased physical activity encouraged We will follow-up on BP in 4 weeks BP Readings from Last 3 Encounters:  06/08/22 (!) 138/92  05/25/22 (!) 122/90  01/29/22 (!) 136/93

## 2022-06-08 NOTE — Progress Notes (Signed)
Established Patient Office Visit  Subjective:  Patient ID: Michelle Pace, female    DOB: 02-18-1980  Age: 42 y.o. MRN: [id number]  CC:  Chief Complaint  Patient presents with   Hypertension    2 week f/u    HPI Michelle Pace is a 42 y.o. female presents for blood pressure follow-up. For the details of today's visit, please refer to the assessment and plan.    Past Medical History:  Diagnosis Date   Hypertension    Panic attacks     Past Surgical History:  Procedure Laterality Date   TUBAL LIGATION N/A 07/14/2020   Procedure: POST PARTUM TUBAL LIGATION;  Surgeon: Venora Maples, MD;  Location: [facility];  Service: Gynecology;  Laterality: N/A;   WISDOM TOOTH EXTRACTION  11/2011    Family History  Problem Relation Age of Onset   Stroke Maternal Grandmother    Cancer Father    Other Mother        brain tumor   Asthma Son     Social History   Socioeconomic History   Marital status: Single    Spouse name: Not on file   Number of children: Not on file   Years of education: Not on file   Highest education level: Not on file  Occupational History   Not on file  Tobacco Use   Smoking status: Never   Smokeless tobacco: Never  Vaping Use   Vaping Use: Never used  Substance and Sexual Activity   Alcohol use: No   Drug use: No   Sexual activity: Yes    Birth control/protection: Surgical    Comment: tubal ligation  Other Topics Concern   Not on file  Social History Narrative   Not on file   Social Determinants of Health   Financial Resource Strain: Low Risk  (04/23/2020)   Overall Financial Resource Strain (CARDIA)    Difficulty of Paying Living Expenses: Not hard at all  Food Insecurity: No Food Insecurity (04/23/2020)   Hunger Vital Sign    Worried About Running Out of Food in the Last Year: Never true    Ran Out of Food in the Last Year: Never true  Transportation Needs: No Transportation Needs (04/23/2020)   PRAPARE - Scientist, research (physical sciences) (Medical): No    Lack of Transportation (Non-Medical): No  Physical Activity: Insufficiently Active (04/23/2020)   Exercise Vital Sign    Days of Exercise per Week: 3 days    Minutes of Exercise per Session: 10 min  Stress: Stress Concern Present (04/23/2020)   [employer] of Occupational Health - Occupational Stress Questionnaire    Feeling of Stress : To some extent  Social Connections: Moderately Isolated (04/23/2020)   Social Connection and Isolation Panel [NHANES]    Frequency of Communication with Friends and Family: More than three times a week    Frequency of Social Gatherings with Friends and Family: Once a week    Attends Religious Services: Never    Database administrator or Organizations: No    Attends Banker Meetings: Never    Marital Status: Living with partner  Intimate Partner Violence: Not At Risk (04/23/2020)   Humiliation, Afraid, Rape, and Kick questionnaire    Fear of Current or Ex-Partner: No    Emotionally Abused: No    Physically Abused: No    Sexually Abused: No    Outpatient Medications Prior to Visit  Medication Sig Dispense Refill  acetaminophen (TYLENOL) 500 MG tablet Take 2 tablets (1,000 mg total) by mouth every 6 (six) hours as needed for mild pain, moderate pain, fever or headache. 30 tablet 0   escitalopram (LEXAPRO) 20 MG tablet TAKE 1 TABLET(20 MG) BY MOUTH DAILY 30 tablet 11   fluticasone (FLONASE) 50 MCG/ACT nasal spray Place 1 spray into both nostrils 2 (two) times daily. 16 g 2   UNABLE TO FIND Med Name: Blood Pressure Cuff R03.0 1 each 0   valsartan (DIOVAN) 40 MG tablet Take 1 tablet (40 mg total) by mouth daily. 90 tablet 3   benzonatate (TESSALON) 100 MG capsule Take 1 capsule (100 mg total) by mouth every 8 (eight) hours. 21 capsule 0   oseltamivir (TAMIFLU) 75 MG capsule Take 1 capsule (75 mg total) by mouth every 12 (twelve) hours. 10 capsule 0   No facility-administered medications prior to visit.    No  Known Allergies  ROS Review of Systems  Constitutional:  Negative for chills and fever.  Eyes:  Negative for visual disturbance.  Respiratory:  Negative for chest tightness and shortness of breath.   Neurological:  Negative for dizziness and headaches.      Objective:    Physical Exam HENT:     Head: Normocephalic.     Mouth/Throat:     Mouth: Mucous membranes are moist.  Cardiovascular:     Rate and Rhythm: Normal rate.     Heart sounds: Normal heart sounds.  Pulmonary:     Effort: Pulmonary effort is normal.     Breath sounds: Normal breath sounds.  Neurological:     Mental Status: She is alert.     BP (!) 138/92   Pulse (!) 108   Ht 5' (1.524 m)   Wt 164 lb 1.9 oz (74.4 kg)   SpO2 99%   BMI 32.05 kg/m  Wt Readings from Last 3 Encounters:  06/08/22 164 lb 1.9 oz (74.4 kg)  05/25/22 162 lb 1.9 oz (73.5 kg)  12/23/21 168 lb (76.2 kg)    Lab Results  Component Value Date   TSH 0.857 05/31/2022   Lab Results  Component Value Date   WBC 8.4 05/31/2022   HGB 13.1 05/31/2022   HCT 40.5 05/31/2022   MCV 90 05/31/2022   PLT 253 05/31/2022   Lab Results  Component Value Date   NA 139 05/31/2022   K 3.9 05/31/2022   CO2 21 05/31/2022   GLUCOSE 82 05/31/2022   BUN 8 05/31/2022   CREATININE 0.68 05/31/2022   BILITOT 0.4 05/31/2022   ALKPHOS 96 05/31/2022   AST 13 05/31/2022   ALT 10 05/31/2022   PROT 6.5 05/31/2022   ALBUMIN 4.0 05/31/2022   CALCIUM 8.9 05/31/2022   ANIONGAP 9 07/13/2020   EGFR 113 05/31/2022   Lab Results  Component Value Date   CHOL 166 05/31/2022   Lab Results  Component Value Date   HDL 43 05/31/2022   Lab Results  Component Value Date   LDLCALC 105 (H) 05/31/2022   Lab Results  Component Value Date   TRIG 99 05/31/2022   Lab Results  Component Value Date   CHOLHDL 3.9 05/31/2022   Lab Results  Component Value Date   HGBA1C 5.3 05/31/2022      Assessment & Plan:  Primary hypertension Assessment &  Plan: Uncontrolled The patient is taking Valsartan 40 mg daily Asymptomatic in the clinic We will discontinue valsartan 40 mg daily Encouraged to start taking valsartan 80 mg daily  Low-sodium diet with increased physical activity encouraged We will follow-up on BP in 4 weeks BP Readings from Last 3 Encounters:  06/08/22 (!) 138/92  05/25/22 (!) 122/90  01/29/22 (!) 136/93     Orders: -     BMP8+EGFR -     Valsartan; Take 1 tablet (80 mg total) by mouth daily.  Dispense: 30 tablet; Refill: 1    Follow-up: Return in about 1 month (around 07/09/2022).   Gilmore Laroche, FNP

## 2022-07-12 ENCOUNTER — Ambulatory Visit: Payer: Medicaid Other | Admitting: Family Medicine

## 2022-07-27 ENCOUNTER — Ambulatory Visit: Payer: Medicaid Other | Admitting: Family Medicine

## 2022-08-21 ENCOUNTER — Ambulatory Visit
Admission: EM | Admit: 2022-08-21 | Discharge: 2022-08-21 | Disposition: A | Discharge status: Home / Self Care | Payer: Medicaid Other | Attending: Nurse Practitioner | Admitting: Nurse Practitioner

## 2022-08-21 ENCOUNTER — Encounter: Payer: Self-pay | Admitting: Emergency Medicine

## 2022-08-21 DIAGNOSIS — Z1152 Encounter for screening for COVID-19: Secondary | ICD-10-CM | POA: Insufficient documentation

## 2022-08-21 DIAGNOSIS — R519 Headache, unspecified: Secondary | ICD-10-CM

## 2022-08-21 DIAGNOSIS — M25511 Pain in right shoulder: Secondary | ICD-10-CM | POA: Diagnosis not present

## 2022-08-21 DIAGNOSIS — Z20822 Contact with and (suspected) exposure to covid-19: Secondary | ICD-10-CM | POA: Diagnosis present

## 2022-08-21 DIAGNOSIS — R509 Fever, unspecified: Secondary | ICD-10-CM | POA: Diagnosis not present

## 2022-08-21 LAB — POCT INFLUENZA A/B
Influenza A, POC: NEGATIVE
Influenza B, POC: NEGATIVE

## 2022-08-21 LAB — POCT RAPID STREP A (OFFICE): Rapid Strep A Screen: NEGATIVE

## 2022-08-21 MED ORDER — ACETAMINOPHEN 325 MG PO TABS
975.0000 mg | ORAL_TABLET | Freq: Once | ORAL | Status: AC
  Administered 2022-08-21: 975 mg via ORAL

## 2022-08-21 MED ORDER — IBUPROFEN 800 MG PO TABS: 800.0000 mg | ORAL_TABLET | Freq: Three times a day (TID) | ORAL | 0 refills | Status: DC

## 2022-08-21 NOTE — ED Provider Notes (Signed)
[facility]    CSN: [id number] Arrival date & time: 08/21/22  1251      History   Chief Complaint No chief complaint on file.   HPI Michelle Pace is a 42 y.o. female.   The history is provided by the patient.   The patient presents for complaints of right shoulder pain and headache.  Symptoms have been present for the past week.  Patient denies injury or trauma, bruising, swelling, radiation of pain, or decreased range of motion.  Patient states she does feel some numbness and tingling in the right arm.  She states the headache has been persistent since that time.  Patient states that she does pick up her son who is "heavy".  She also states when she carries him, she does favor the right arm.  Patient presents febrile.  She did not realize that she had a fever.  She denies nasal congestion, runny nose, cough, abdominal pain, nausea, vomiting, or diarrhea. 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/23/2020   History of bilateral tubal ligation 07/14/2020   Chronic hypertension affecting pregnancy 04/02/2020   Panic attacks 01/16/2020    Past Surgical History:  Procedure Laterality Date   TUBAL LIGATION N/A 07/14/2020   Procedure: POST PARTUM TUBAL LIGATION;  Surgeon: Venora Maples, MD;  Location: [facility];  Service: Gynecology;  Laterality: N/A;   WISDOM TOOTH EXTRACTION  11/2011    OB History     Gravida  4   Para  3   Term  3   Preterm      AB  1   Living  3      SAB  1   IAB      Ectopic      Multiple  0   Live Births  3            Home Medications    Prior to Admission medications   Medication Sig Start Date End Date Taking? Authorizing Provider  ibuprofen (ADVIL) 800 MG tablet Take 1  tablet (800 mg total) by mouth 3 (three) times daily. 08/21/22  Yes Keenan Trefry-Warren, Sadie Haber, NP  Vitamin D, Ergocalciferol, (DRISDOL) 1.25 MG (50000 UNIT) CAPS capsule Take 50,000 Units by mouth every 7 (seven) days.   Yes [provider]  acetaminophen (TYLENOL) 500 MG tablet Take 2 tablets (1,000 mg total) by mouth every 6 (six) hours as needed for mild pain, moderate pain, fever or headache. 07/15/20   Sheila Oats, MD  escitalopram (LEXAPRO) 20 MG tablet TAKE 1 TABLET(20 MG) BY MOUTH DAILY 12/27/21   Lazaro Arms, MD  fluticasone (FLONASE) 50 MCG/ACT nasal spray Place 1 spray into both nostrils 2 (two) times daily. 01/29/22   Particia Nearing, PA-C  UNABLE TO FIND Med Name: Blood Pressure Cuff R03.0 05/31/22   Gilmore Laroche, FNP  valsartan (DIOVAN) 80 MG tablet Take 1 tablet (80 mg total) by mouth daily. 06/08/22   Gilmore Laroche, FNP    Family History Family History  Problem Relation Age of Onset   Stroke Maternal Grandmother    Cancer Father    Other Mother        brain tumor   Asthma Son  Social History Social History   Tobacco Use   Smoking status: Never   Smokeless tobacco: Never  Vaping Use   Vaping status: Never Used  Substance Use Topics   Alcohol use: No   Drug use: No     Allergies   Patient has no known allergies.   Review of Systems Review of Systems Per HPI  Physical Exam Triage Vital Signs ED Triage Vitals  Encounter Vitals Group     BP 08/21/22 1357 117/82     Systolic BP Percentile --      Diastolic BP Percentile --      Pulse --      Resp 08/21/22 1357 18     Temp 08/21/22 1357 (!) 102.8 F (39.3 C)     Temp Source 08/21/22 1357 Oral     SpO2 08/21/22 1357 98 %     Weight --      Height --      Head Circumference --      Peak Flow --      Pain Score 08/21/22 1358 6     Pain Loc --      Pain Education --      Exclude from Growth Chart --    No data found.  Updated Vital Signs BP 117/82 (BP Location: Right Arm)    Temp (!) 102.8 F (39.3 C) (Oral)   Resp 18   LMP 08/08/2022 (Approximate)   SpO2 98%   Visual Acuity Right Eye Distance:   Left Eye Distance:   Bilateral Distance:    Right Eye Near:   Left Eye Near:    Bilateral Near:     Physical Exam Vitals and nursing note reviewed.  Constitutional:      General: She is not in acute distress.    Appearance: Normal appearance.  HENT:     Head: Normocephalic.     Right Ear: Tympanic membrane, ear canal and external ear normal.     Left Ear: Tympanic membrane, ear canal and external ear normal.     Nose: Nose normal.     Mouth/Throat:     Mouth: Mucous membranes are moist.  Eyes:     Extraocular Movements: Extraocular movements intact.     Conjunctiva/sclera: Conjunctivae normal.     Pupils: Pupils are equal, round, and reactive to light.  Cardiovascular:     Rate and Rhythm: Normal rate and regular rhythm.     Pulses: Normal pulses.     Heart sounds: Normal heart sounds.  Pulmonary:     Effort: Pulmonary effort is normal. No respiratory distress.     Breath sounds: Normal breath sounds. No stridor. No wheezing, rhonchi or rales.  Abdominal:     General: Bowel sounds are normal.     Palpations: Abdomen is soft.     Tenderness: There is no abdominal tenderness.  Musculoskeletal:     Right shoulder: Tenderness (Tenderness noted to the [facility] joint) present. No swelling or deformity. Normal range of motion. Normal strength. Normal pulse.     Right upper arm: Normal.     Cervical back: Normal range of motion.  Lymphadenopathy:     Cervical: No cervical adenopathy.  Skin:    General: Skin is warm and dry.  Neurological:     General: No focal deficit present.     Mental Status: She is alert and oriented to person, place, and time.  Psychiatric:        Mood and Affect: Mood normal.  Behavior: Behavior normal.      UC Treatments / Results  Labs (all labs ordered are listed, but only abnormal results are displayed) Labs  Reviewed  SARS CORONAVIRUS 2 (TAT 6-24 HRS)  POCT RAPID STREP A (OFFICE)  POCT INFLUENZA A/B    EKG   Radiology No results found.  Procedures Procedures (including critical care time)  Medications Ordered in UC Medications  acetaminophen (TYLENOL) tablet 975 mg (975 mg Oral Given 08/21/22 1401)    Initial Impression / Assessment and Plan / UC Course  I have reviewed the triage vital signs and the nursing notes.  Pertinent labs & imaging results that were available during my care of the patient were reviewed by me and considered in my medical decision making (see chart for details).  The patient is well-appearing, she is in no acute distress, but she is febrile during triage.  Patient was administered Tylenol 975 mg in the office.  On exam, right shoulder does not display any abnormalities such as deformity, erythema, or decreased range of motion.  She does have tenderness to the [facility] joint.  Patient has not sustained any known injury or trauma, and has good range of motion.  Imaging was deferred.  Difficult to ascertain the cause of the patient's symptoms other than favoring the right upper extremity when she is carrying her son.  Headache most likely of viral etiology as she has also presented with underlying fever.  Rapid strep test and influenza test were negative, COVID test is pending.  Ibuprofen 800 mg prescribed for shoulder pain, headache pain, and fever.  Patient is a candidate to receive Paxlovid if her COVID test is positive.  Supportive care recommendations were provided and discussed with the patient to include increasing fluids, allowing for plenty of rest, ice or heat for the right shoulder, and shoulder exercises to keep the joint stable.  Patient was advised that if symptoms or not improving over the next 5 to 7 days, recommend following up with her primary care physician for further evaluation.  Patient is in agreement with this plan of care and verbalizes understanding.  All  questions were answered.  Patient stable for discharge.  Final Clinical Impressions(s) / UC Diagnoses   Final diagnoses:  Close exposure to COVID-19 virus  Fever, unspecified  Right shoulder pain, unspecified chronicity  Nonintractable headache, unspecified chronicity pattern, unspecified headache type  Encounter for screening for COVID-19     Discharge Instructions      Rapid strep test and influenza test are negative.  COVID test is pending. Take medication as prescribed. Increase fluids and allow for plenty of rest. Recommend the application of ice or heat for the right shoulder.  Apply ice for pain or swelling, heat for spasm or stiffness.  Apply for 20 minutes, remove for 1 hour, then repeat as needed. Gentle range of motion exercises for the right shoulder all symptoms persist. As discussed, recommend hearing your son on the opposite shoulder while symptoms are present. If symptoms of your shoulder do not improve, recommend following up with orthopedics for further evaluation.  You can follow-up with [facility] or with [facility] for further evaluation.  For your headache: Take medication as prescribed. Recommend a cool cloth across the forehead while symptoms persist. Make sure you are eating and drinking.  Make sure you are drinking at least 8-10 8 ounce glasses of water daily. If you develop worsening headache with change in mental status, slurred speech, or other concerns, please go  to the emergency department immediately for further evaluation.  Follow-up as needed.      ED Prescriptions     Medication Sig Dispense Auth. Provider   ibuprofen (ADVIL) 800 MG tablet Take 1 tablet (800 mg total) by mouth 3 (three) times daily. 30 tablet Etan Vasudevan-Warren, Sadie Haber, NP      PDMP not reviewed this encounter.   Abran Cantor, NP 08/21/22 1458

## 2022-08-21 NOTE — Discharge Instructions (Addendum)
Rapid strep test and influenza test are negative.  COVID test is pending. Take medication as prescribed. Increase fluids and allow for plenty of rest. Recommend the application of ice or heat for the right shoulder.  Apply ice for pain or swelling, heat for spasm or stiffness.  Apply for 20 minutes, remove for 1 hour, then repeat as needed. Gentle range of motion exercises for the right shoulder all symptoms persist. As discussed, recommend hearing your son on the opposite shoulder while symptoms are present. If symptoms of your shoulder do not improve, recommend following up with orthopedics for further evaluation.  You can follow-up with [facility] or with [facility] for further evaluation.  For your headache: Take medication as prescribed. Recommend a cool cloth across the forehead while symptoms persist. Make sure you are eating and drinking.  Make sure you are drinking at least 8-10 8 ounce glasses of water daily. If you develop worsening headache with change in mental status, slurred speech, or other concerns, please go to the emergency department immediately for further evaluation.  Follow-up as needed.

## 2022-08-21 NOTE — ED Triage Notes (Signed)
Right arm pain x 1 week with headache and fever and chills.  Has taken a good power around 1pm.

## 2022-08-22 LAB — SARS CORONAVIRUS 2 (TAT 6-24 HRS): SARS Coronavirus 2: NEGATIVE

## 2022-08-23 ENCOUNTER — Ambulatory Visit: Payer: Medicaid Other | Admitting: Family Medicine

## 2022-08-23 ENCOUNTER — Encounter: Payer: Self-pay | Admitting: Family Medicine

## 2022-08-23 VITALS — BP 123/85 | HR 109 | Ht 60.0 in | Wt 163.0 lb

## 2022-08-23 DIAGNOSIS — J069 Acute upper respiratory infection, unspecified: Secondary | ICD-10-CM

## 2022-08-23 DIAGNOSIS — I10 Essential (primary) hypertension: Secondary | ICD-10-CM | POA: Diagnosis not present

## 2022-08-23 MED ORDER — VALSARTAN 80 MG PO TABS
80.0000 mg | ORAL_TABLET | Freq: Every day | ORAL | 1 refills | Status: DC
Start: 2022-08-23 — End: 2023-02-17

## 2022-08-23 MED ORDER — PROMETHAZINE-DM 6.25-15 MG/5ML PO SYRP: 5.0000 mL | ORAL_SOLUTION | Freq: Four times a day (QID) | ORAL | 0 refills | Status: DC | PRN

## 2022-08-23 NOTE — Assessment & Plan Note (Signed)
Controlled She takes valsartan 80 mg daily Asymptomatic in the clinic Encouraged low-sodium diet with increased physical activity Pending BMP BP Readings from Last 3 Encounters:  08/23/22 123/85  08/21/22 117/82  06/08/22 (!) 138/92

## 2022-08-23 NOTE — Assessment & Plan Note (Signed)
Take medication as prescribed. Increase fluids and allow for plenty of rest. Recommend Tylenol or ibuprofen as needed for pain, fever, or general discomfort. Warm salt water gargles 3-4 times daily to help with throat pain or discomfort. Recommend using a humidifier at bedtime during sleep to help with cough and nasal congestion. Follow-up if your symptoms do not improve.

## 2022-08-23 NOTE — Progress Notes (Signed)
Established Patient Office Visit  Subjective:  Patient ID: Michelle Pace, female    DOB: 11/04/1980  Age: 42 y.o. MRN: [id number]  CC:  Chief Complaint  Patient presents with   Hypertension    Following up on blood pressure check.    Nasal Congestion    Pt reports yellow phlegm from sick visit to urgent care 08/21/22.    HPI Michelle Pace is a 42 y.o. female presents for hypertension f/u with complaints of nasal congestion.  She tested negative for COVID, RSV, and flu on 08/21/2022.  She was symptomatically with ibuprofen rest, and increase hydration.  Past Medical History:  Diagnosis Date   Hypertension    Panic attacks     Past Surgical History:  Procedure Laterality Date   TUBAL LIGATION N/A 07/14/2020   Procedure: POST PARTUM TUBAL LIGATION;  Surgeon: Venora Maples, MD;  Location: [facility];  Service: Gynecology;  Laterality: N/A;   WISDOM TOOTH EXTRACTION  11/2011    Family History  Problem Relation Age of Onset   Stroke Maternal Grandmother    Cancer Father    Other Mother        brain tumor   Asthma Son     Social History   Socioeconomic History   Marital status: Single    Spouse name: Not on file   Number of children: Not on file   Years of education: Not on file   Highest education level: Not on file  Occupational History   Not on file  Tobacco Use   Smoking status: Never   Smokeless tobacco: Never  Vaping Use   Vaping status: Never Used  Substance and Sexual Activity   Alcohol use: No   Drug use: No   Sexual activity: Yes    Birth control/protection: Surgical    Comment: tubal ligation  Other Topics Concern   Not on file  Social History Narrative   Not on file   Social Determinants of Health   Financial Resource Strain: Low Risk  (04/23/2020)   Overall Financial Resource Strain (CARDIA)    Difficulty of Paying Living Expenses: Not hard at all  Food Insecurity: No Food Insecurity (04/23/2020)   Hunger Vital Sign    Worried About  Running Out of Food in the Last Year: Never true    Ran Out of Food in the Last Year: Never true  Transportation Needs: No Transportation Needs (04/23/2020)   PRAPARE - Administrator, Civil Service (Medical): No    Lack of Transportation (Non-Medical): No  Physical Activity: Insufficiently Active (04/23/2020)   Exercise Vital Sign    Days of Exercise per Week: 3 days    Minutes of Exercise per Session: 10 min  Stress: Stress Concern Present (04/23/2020)   [employer] of Occupational Health - Occupational Stress Questionnaire    Feeling of Stress : To some extent  Social Connections: Moderately Isolated (04/23/2020)   Social Connection and Isolation Panel [NHANES]    Frequency of Communication with Friends and Family: More than three times a week    Frequency of Social Gatherings with Friends and Family: Once a week    Attends Religious Services: Never    Database administrator or Organizations: No    Attends Banker Meetings: Never    Marital Status: Living with partner  Intimate Partner Violence: Not At Risk (04/23/2020)   Humiliation, Afraid, Rape, and Kick questionnaire    Fear of Current or Ex-Partner: No  Emotionally Abused: No    Physically Abused: No    Sexually Abused: No    Outpatient Medications Prior to Visit  Medication Sig Dispense Refill   acetaminophen (TYLENOL) 500 MG tablet Take 2 tablets (1,000 mg total) by mouth every 6 (six) hours as needed for mild pain, moderate pain, fever or headache. 30 tablet 0   escitalopram (LEXAPRO) 20 MG tablet TAKE 1 TABLET(20 MG) BY MOUTH DAILY 30 tablet 11   fluticasone (FLONASE) 50 MCG/ACT nasal spray Place 1 spray into both nostrils 2 (two) times daily. 16 g 2   ibuprofen (ADVIL) 800 MG tablet Take 1 tablet (800 mg total) by mouth 3 (three) times daily. 30 tablet 0   UNABLE TO FIND Med Name: Blood Pressure Cuff R03.0 1 each 0   Vitamin D, Ergocalciferol, (DRISDOL) 1.25 MG (50000 UNIT) CAPS capsule Take  50,000 Units by mouth every 7 (seven) days.     valsartan (DIOVAN) 80 MG tablet Take 1 tablet (80 mg total) by mouth daily. 30 tablet 1   No facility-administered medications prior to visit.    No Known Allergies  ROS Review of Systems  Constitutional:  Negative for chills and fever.  HENT:  Positive for congestion. Negative for sinus pressure, sinus pain and sore throat.   Eyes:  Negative for visual disturbance.  Respiratory:  Negative for chest tightness and shortness of breath.   Neurological:  Negative for dizziness and headaches.      Objective:    Physical Exam HENT:     Head: Normocephalic.     Mouth/Throat:     Mouth: Mucous membranes are moist.  Cardiovascular:     Rate and Rhythm: Normal rate.     Heart sounds: Normal heart sounds.  Pulmonary:     Effort: Pulmonary effort is normal.     Breath sounds: Normal breath sounds.  Neurological:     Mental Status: She is alert.     BP 123/85   Pulse (!) 109   Ht 5' (1.524 m)   Wt 163 lb 0.6 oz (74 kg)   LMP 08/08/2022 (Approximate)   SpO2 98%   BMI 31.84 kg/m  Wt Readings from Last 3 Encounters:  08/23/22 163 lb 0.6 oz (74 kg)  06/08/22 164 lb 1.9 oz (74.4 kg)  05/25/22 162 lb 1.9 oz (73.5 kg)    Lab Results  Component Value Date   TSH 0.857 05/31/2022   Lab Results  Component Value Date   WBC 8.4 05/31/2022   HGB 13.1 05/31/2022   HCT 40.5 05/31/2022   MCV 90 05/31/2022   PLT 253 05/31/2022   Lab Results  Component Value Date   NA 139 05/31/2022   K 3.9 05/31/2022   CO2 21 05/31/2022   GLUCOSE 82 05/31/2022   BUN 8 05/31/2022   CREATININE 0.68 05/31/2022   BILITOT 0.4 05/31/2022   ALKPHOS 96 05/31/2022   AST 13 05/31/2022   ALT 10 05/31/2022   PROT 6.5 05/31/2022   ALBUMIN 4.0 05/31/2022   CALCIUM 8.9 05/31/2022   ANIONGAP 9 07/13/2020   EGFR 113 05/31/2022   Lab Results  Component Value Date   CHOL 166 05/31/2022   Lab Results  Component Value Date   HDL 43 05/31/2022   Lab  Results  Component Value Date   LDLCALC 105 (H) 05/31/2022   Lab Results  Component Value Date   TRIG 99 05/31/2022   Lab Results  Component Value Date   CHOLHDL 3.9 05/31/2022  Lab Results  Component Value Date   HGBA1C 5.3 05/31/2022      Assessment & Plan:  Primary hypertension Assessment & Plan: Controlled She takes valsartan 80 mg daily Asymptomatic in the clinic Encouraged low-sodium diet with increased physical activity Pending BMP BP Readings from Last 3 Encounters:  08/23/22 123/85  08/21/22 117/82  06/08/22 (!) 138/92     Orders: -     Valsartan; Take 1 tablet (80 mg total) by mouth daily.  Dispense: 90 tablet; Refill: 1 -     BMP8+EGFR  Viral upper respiratory tract infection Assessment & Plan: Take medication as prescribed Increase fluids and allow for plenty of rest. Recommend Tylenol or ibuprofen as needed for pain, fever, or general discomfort. Warm salt water gargles 3-4 times daily to help with throat pain or discomfort. Recommend using a humidifier at bedtime during sleep to help with cough and nasal congestion. Follow-up if your symptoms do not improve    Orders: -     Promethazine-DM; Take 5 mLs by mouth 4 (four) times daily as needed.  Dispense: 118 mL; Refill: 0  Note: This chart has been completed using Engineer, civil (consulting) software, and while attempts have been made to ensure accuracy, certain words and phrases may not be transcribed as intended.    Follow-up: Return in about 4 months (around 12/23/2022).   Gilmore Laroche, FNP

## 2022-08-23 NOTE — Patient Instructions (Addendum)
I appreciate the opportunity to provide care to you today!    Follow up:  4 months  Labs: please stop by the lab today to get your blood drawn (BMP)  Take medication as prescribed. Increase fluids and allow for plenty of rest. Recommend Tylenol or ibuprofen as needed for pain, fever, or general discomfort. Warm salt water gargles 3-4 times daily to help with throat pain or discomfort. Recommend using a humidifier at bedtime during sleep to help with cough and nasal congestion. Follow-up if your symptoms do not improve    Attached with your AVS, you will find valuable resources for self-education. I highly recommend dedicating some time to thoroughly examine them.   Please continue to a heart-healthy diet and increase your physical activities. Try to exercise for at least five days a week.    It was a pleasure to see you and I look forward to continuing to work together on your health and well-being. Please do not hesitate to call the office if you need care or have questions about your care.  In case of emergency, please visit the Emergency Department for urgent care, or contact our clinic at [phone] to schedule an appointment. We're here to help you!   Have a wonderful day and week. With Gratitude, Gilmore Laroche MSN, FNP-BC

## 2022-08-25 ENCOUNTER — Other Ambulatory Visit: Payer: Self-pay | Admitting: Family Medicine

## 2022-08-25 DIAGNOSIS — I10 Essential (primary) hypertension: Secondary | ICD-10-CM

## 2022-12-24 ENCOUNTER — Ambulatory Visit (INDEPENDENT_AMBULATORY_CARE_PROVIDER_SITE_OTHER): Payer: Medicaid Other | Admitting: Family Medicine

## 2022-12-24 ENCOUNTER — Encounter: Payer: Self-pay | Admitting: Family Medicine

## 2022-12-24 VITALS — BP 134/80 | HR 93 | Ht 60.0 in | Wt 170.0 lb

## 2022-12-24 DIAGNOSIS — E038 Other specified hypothyroidism: Secondary | ICD-10-CM | POA: Diagnosis not present

## 2022-12-24 DIAGNOSIS — I10 Essential (primary) hypertension: Secondary | ICD-10-CM | POA: Diagnosis not present

## 2022-12-24 DIAGNOSIS — R7301 Impaired fasting glucose: Secondary | ICD-10-CM

## 2022-12-24 DIAGNOSIS — E559 Vitamin D deficiency, unspecified: Secondary | ICD-10-CM | POA: Diagnosis not present

## 2022-12-24 DIAGNOSIS — E7849 Other hyperlipidemia: Secondary | ICD-10-CM

## 2022-12-24 NOTE — Patient Instructions (Addendum)
I appreciate the opportunity to provide care to you today!    Follow up:  4 months  Labs: please stop by the lab today/during to get your blood drawn (CBC, CMP, TSH, Lipid profile, HgA1c, Vit D)   Attached with your AVS, you will find valuable resources for self-education. I highly recommend dedicating some time to thoroughly examine them.   Please continue to a heart-healthy diet and increase your physical activities. Try to exercise for at least five days a week.    It was a pleasure to see you and I look forward to continuing to work together on your health and well-being. Please do not hesitate to call the office if you need care or have questions about your care.  In case of emergency, please visit the Emergency Department for urgent care, or contact our clinic at [phone] to schedule an appointment. We're here to help you!   Have a wonderful day and week. With Gratitude, Gilmore Laroche MSN, FNP-BC

## 2022-12-24 NOTE — Progress Notes (Signed)
Established Patient Office Visit  Subjective:  Patient ID: Michelle Pace, female    DOB: September 03, 1980  Age: 42 y.o. MRN: [id number]  CC:  Chief Complaint  Patient presents with   Care Management    4 month f/u    HPI Michelle Pace is a 42 y.o. female with past medical history of HTN, Vit d deficiency presents for f/u of  chronic medical conditions. For the details of today's visit, please refer to the assessment and plan.     Past Medical History:  Diagnosis Date   Hypertension    Panic attacks     Past Surgical History:  Procedure Laterality Date   TUBAL LIGATION N/A 07/14/2020   Procedure: POST PARTUM TUBAL LIGATION;  Surgeon: Venora Maples, MD;  Location: [facility];  Service: Gynecology;  Laterality: N/A;   WISDOM TOOTH EXTRACTION  11/2011    Family History  Problem Relation Age of Onset   Stroke Maternal Grandmother    Cancer Father    Other Mother        brain tumor   Asthma Son     Social History   Socioeconomic History   Marital status: Single    Spouse name: Not on file   Number of children: Not on file   Years of education: Not on file   Highest education level: Not on file  Occupational History   Not on file  Tobacco Use   Smoking status: Never   Smokeless tobacco: Never  Vaping Use   Vaping status: Never Used  Substance and Sexual Activity   Alcohol use: No   Drug use: No   Sexual activity: Yes    Birth control/protection: Surgical    Comment: tubal ligation  Other Topics Concern   Not on file  Social History Narrative   Not on file   Social Determinants of Health   Financial Resource Strain: Low Risk  (04/23/2020)   Overall Financial Resource Strain (CARDIA)    Difficulty of Paying Living Expenses: Not hard at all  Food Insecurity: No Food Insecurity (04/23/2020)   Hunger Vital Sign    Worried About Running Out of Food in the Last Year: Never true    Ran Out of Food in the Last Year: Never true  Transportation Needs: No  Transportation Needs (04/23/2020)   PRAPARE - Administrator, Civil Service (Medical): No    Lack of Transportation (Non-Medical): No  Physical Activity: Insufficiently Active (04/23/2020)   Exercise Vital Sign    Days of Exercise per Week: 3 days    Minutes of Exercise per Session: 10 min  Stress: Stress Concern Present (04/23/2020)   [employer] of Occupational Health - Occupational Stress Questionnaire    Feeling of Stress : To some extent  Social Connections: Moderately Isolated (04/23/2020)   Social Connection and Isolation Panel [NHANES]    Frequency of Communication with Friends and Family: More than three times a week    Frequency of Social Gatherings with Friends and Family: Once a week    Attends Religious Services: Never    Database administrator or Organizations: No    Attends Banker Meetings: Never    Marital Status: Living with partner  Intimate Partner Violence: Not At Risk (04/23/2020)   Humiliation, Afraid, Rape, and Kick questionnaire    Fear of Current or Ex-Partner: No    Emotionally Abused: No    Physically Abused: No    Sexually Abused: No  Outpatient Medications Prior to Visit  Medication Sig Dispense Refill   acetaminophen (TYLENOL) 500 MG tablet Take 2 tablets (1,000 mg total) by mouth every 6 (six) hours as needed for mild pain, moderate pain, fever or headache. 30 tablet 0   escitalopram (LEXAPRO) 20 MG tablet TAKE 1 TABLET(20 MG) BY MOUTH DAILY 30 tablet 11   fluticasone (FLONASE) 50 MCG/ACT nasal spray Place 1 spray into both nostrils 2 (two) times daily. 16 g 2   ibuprofen (ADVIL) 800 MG tablet Take 1 tablet (800 mg total) by mouth 3 (three) times daily. 30 tablet 0   promethazine-dextromethorphan (PROMETHAZINE-DM) 6.25-15 MG/5ML syrup Take 5 mLs by mouth 4 (four) times daily as needed. 118 mL 0   UNABLE TO FIND Med Name: Blood Pressure Cuff R03.0 1 each 0   valsartan (DIOVAN) 80 MG tablet Take 1 tablet (80 mg total) by mouth  daily. 90 tablet 1   Vitamin D, Ergocalciferol, (DRISDOL) 1.25 MG (50000 UNIT) CAPS capsule Take 50,000 Units by mouth every 7 (seven) days.     No facility-administered medications prior to visit.    No Known Allergies  ROS Review of Systems  Constitutional:  Negative for chills and fever.  Eyes:  Negative for visual disturbance.  Respiratory:  Negative for chest tightness and shortness of breath.   Neurological:  Negative for dizziness and headaches.      Objective:    Physical Exam HENT:     Head: Normocephalic.     Mouth/Throat:     Mouth: Mucous membranes are moist.  Cardiovascular:     Rate and Rhythm: Normal rate.     Heart sounds: Normal heart sounds.  Pulmonary:     Effort: Pulmonary effort is normal.     Breath sounds: Normal breath sounds.  Neurological:     Mental Status: She is alert.     BP 134/80   Pulse 93   Ht 5' (1.524 m)   Wt 170 lb (77.1 kg)   SpO2 99%   BMI 33.20 kg/m  Wt Readings from Last 3 Encounters:  12/24/22 170 lb (77.1 kg)  08/23/22 163 lb 0.6 oz (74 kg)  06/08/22 164 lb 1.9 oz (74.4 kg)    Lab Results  Component Value Date   TSH 0.857 05/31/2022   Lab Results  Component Value Date   WBC 8.4 05/31/2022   HGB 13.1 05/31/2022   HCT 40.5 05/31/2022   MCV 90 05/31/2022   PLT 253 05/31/2022   Lab Results  Component Value Date   NA 141 08/23/2022   K 3.4 (L) 08/23/2022   CO2 21 08/23/2022   GLUCOSE 126 (H) 08/23/2022   BUN 12 08/23/2022   CREATININE 0.91 08/23/2022   BILITOT 0.4 05/31/2022   ALKPHOS 96 05/31/2022   AST 13 05/31/2022   ALT 10 05/31/2022   PROT 6.5 05/31/2022   ALBUMIN 4.0 05/31/2022   CALCIUM 8.9 08/23/2022   ANIONGAP 9 07/13/2020   EGFR 82 08/23/2022   Lab Results  Component Value Date   CHOL 166 05/31/2022   Lab Results  Component Value Date   HDL 43 05/31/2022   Lab Results  Component Value Date   LDLCALC 105 (H) 05/31/2022   Lab Results  Component Value Date   TRIG 99 05/31/2022    Lab Results  Component Value Date   CHOLHDL 3.9 05/31/2022   Lab Results  Component Value Date   HGBA1C 5.3 05/31/2022      Assessment & Plan:  Primary  hypertension Assessment & Plan: Controlled She takes valsartan 80 mg daily Asymptomatic in the clinic Encouraged low-sodium diet with increased physical activity BP Readings from Last 3 Encounters:  12/24/22 134/80  08/23/22 123/85  08/21/22 117/82      Vitamin D deficiency Assessment & Plan: Asymptomatic Pending vitamin D levels Last vitamin D Lab Results  Component Value Date   VD25OH 36.2 05/31/2022     Orders: -     VITAMIN D 25 Hydroxy (Vit-D Deficiency, Fractures)  IFG (impaired fasting glucose) -     Hemoglobin A1c  TSH (thyroid-stimulating hormone deficiency) -     TSH + free T4  Other hyperlipidemia -     Lipid panel -     CMP14+EGFR -     CBC with Differential/Platelet  Note: This chart has been completed using Engineer, civil (consulting) software, and while attempts have been made to ensure accuracy, certain words and phrases may not be transcribed as intended.    Follow-up: Return in about 4 months (around 04/24/2023).   Gilmore Laroche, FNP

## 2022-12-24 NOTE — Assessment & Plan Note (Signed)
Asymptomatic Pending vitamin D levels Last vitamin D Lab Results  Component Value Date   VD25OH 36.2 05/31/2022

## 2022-12-24 NOTE — Assessment & Plan Note (Addendum)
Controlled She takes valsartan 80 mg daily Asymptomatic in the clinic Encouraged low-sodium diet with increased physical activity BP Readings from Last 3 Encounters:  12/24/22 134/80  08/23/22 123/85  08/21/22 117/82

## 2022-12-28 ENCOUNTER — Other Ambulatory Visit (HOSPITAL_COMMUNITY)
Admission: RE | Admit: 2022-12-28 | Discharge: 2022-12-28 | Disposition: A | Discharge status: Home / Self Care | Payer: Medicaid Other | Source: Ambulatory Visit | Attending: Adult Health | Admitting: Adult Health

## 2022-12-28 ENCOUNTER — Ambulatory Visit (INDEPENDENT_AMBULATORY_CARE_PROVIDER_SITE_OTHER): Payer: Medicaid Other | Admitting: Adult Health

## 2022-12-28 ENCOUNTER — Encounter: Payer: Self-pay | Admitting: Adult Health

## 2022-12-28 VITALS — BP 133/95 | HR 124 | Ht 60.0 in | Wt 169.0 lb

## 2022-12-28 DIAGNOSIS — I10 Essential (primary) hypertension: Secondary | ICD-10-CM | POA: Diagnosis not present

## 2022-12-28 DIAGNOSIS — Z01419 Encounter for gynecological examination (general) (routine) without abnormal findings: Secondary | ICD-10-CM | POA: Insufficient documentation

## 2022-12-28 DIAGNOSIS — Z1211 Encounter for screening for malignant neoplasm of colon: Secondary | ICD-10-CM | POA: Diagnosis not present

## 2022-12-28 LAB — HEMOCCULT GUIAC POC 1CARD (OFFICE): Fecal Occult Blood, POC: NEGATIVE

## 2022-12-28 NOTE — Progress Notes (Signed)
Patient ID: Michelle Pace, female   DOB: 11-Sep-1980, 42 y.o.   MRN: [id number] History of Present Illness:  Michelle Pace is a 42 year old white female, with SO, [id number] in for a well woman gyn exam and pap.  PCP is Gilmore Laroche NP   Current Medications, Allergies, Past Medical History, Past Surgical History, Family History and Social History were reviewed in [name] record.     Review of Systems: Patient denies any hearing loss, fatigue, blurred vision, shortness of breath, chest pain, abdominal pain, problems with bowel movements, urination, or intercourse. No joint pain or mood swings.  Has headaches occasionally   Physical Exam:BP (!) 133/95 (BP Location: Left Arm, Patient Position: Sitting, Cuff Size: Normal)   Pulse (!) 124   Ht 5' (1.524 m)   Wt 169 lb (76.7 kg)   LMP 12/08/2022 (Approximate)   BMI 33.01 kg/m   General:  Well developed, well nourished, no acute distress Skin:  Warm and dry Neck:  Midline trachea, normal thyroid, good ROM, no lymphadenopathy Lungs; Clear to auscultation bilaterally Breast:  No dominant palpable mass, retraction, or nipple discharge Cardiovascular: Regular rate and rhythm Abdomen:  Soft, non tender, no hepatosplenomegaly Pelvic:  External genitalia is normal in appearance, no lesions.  The vagina is normal in appearance. Urethra has no lesions or masses. The cervix is bulbous. Pap with HR HPV genotyping performed.Uterus is felt to be normal size, shape, and contour.  No adnexal masses or tenderness noted.Bladder is non tender, no masses felt. Rectal: Good sphincter tone, no polyps, or hemorrhoids felt.  Hemoccult negative. Extremities/musculoskeletal:  No swelling or varicosities noted, no clubbing or cyanosis Psych:  No mood changes, alert and cooperative,seems happy AA is 0 Fall risk is low    12/28/2022    2:45 PM 12/24/2022    1:42 PM 12/24/2022    1:37 PM  Depression screen PHQ 2/9  Decreased Interest 0 0 0   Down, Depressed, Hopeless 0 0 0  PHQ - 2 Score 0 0 0  Altered sleeping 0 0 0  Tired, decreased energy 0 0 0  Change in appetite 0 0 0  Feeling bad or failure about yourself  0 0 0  Trouble concentrating 0 0 0  Moving slowly or fidgety/restless 0 0 0  Suicidal thoughts 0 0 0  PHQ-9 Score 0 0 0  Difficult doing work/chores  Not difficult at all Not difficult at all   On lexapro     12/28/2022    2:45 PM 12/24/2022    1:42 PM 12/24/2022    1:38 PM 08/23/2022    1:33 PM  GAD 7 : Generalized Anxiety Score  Nervous, Anxious, on Edge 1 0 0 2  Control/stop worrying 0 0 0 0  Worry too much - different things 0 0 0 0  Trouble relaxing 0 0 0 0  Restless 0 0 0 0  Easily annoyed or irritable 1 0 0 2  Afraid - awful might happen 0 0 0 0  Total GAD 7 Score 2 0 0 4  Anxiety Difficulty  Not difficult at all Not difficult at all Not difficult at all    Upstream - 12/28/22 1443       Pregnancy Intention Screening   Does the patient want to become pregnant in the next year? No    Would the patient like to discuss contraceptive options today? No      Contraception Wrap Up   Current Method Female Sterilization  tubal   End Method Female Sterilization    Contraception Counseling Provided No            Examination chaperoned by Freddie Apley RN    Impression and Plan: 1. Encounter for gynecological examination with Papanicolaou smear of cervix Pap sent Pap in 3 years if normal Physical in 1 year Labs with PCP Get mammogram in future  Stay active   2. Encounter for screening fecal occult blood testing Hemoccult was negative   3. Hypertension, unspecified type Take meds and follow up with PCP

## 2022-12-28 NOTE — Addendum Note (Signed)
Addended by: Caralyn Guile on: 12/28/2022 04:25 PM   Modules accepted: Orders

## 2022-12-30 LAB — CYTOLOGY - PAP
Comment: NEGATIVE
Diagnosis: NEGATIVE
High risk HPV: NEGATIVE

## 2023-01-06 ENCOUNTER — Encounter: Payer: Self-pay | Admitting: Family Medicine

## 2023-01-12 ENCOUNTER — Other Ambulatory Visit: Payer: Self-pay | Admitting: Obstetrics & Gynecology

## 2023-01-18 ENCOUNTER — Ambulatory Visit: Payer: Medicaid Other | Admitting: Internal Medicine

## 2023-02-17 ENCOUNTER — Other Ambulatory Visit: Payer: Self-pay

## 2023-02-17 ENCOUNTER — Other Ambulatory Visit: Payer: Self-pay | Admitting: Family Medicine

## 2023-02-17 DIAGNOSIS — I10 Essential (primary) hypertension: Secondary | ICD-10-CM

## 2023-02-17 NOTE — Telephone Encounter (Signed)
Provider approved

## 2023-02-22 ENCOUNTER — Ambulatory Visit: Payer: Medicaid Other

## 2023-03-18 ENCOUNTER — Ambulatory Visit (INDEPENDENT_AMBULATORY_CARE_PROVIDER_SITE_OTHER): Payer: Medicaid Other | Admitting: Family Medicine

## 2023-03-18 ENCOUNTER — Encounter: Payer: Self-pay | Admitting: Family Medicine

## 2023-03-18 VITALS — BP 138/94 | HR 98 | Ht 60.0 in | Wt 168.0 lb

## 2023-03-18 DIAGNOSIS — J0100 Acute maxillary sinusitis, unspecified: Secondary | ICD-10-CM | POA: Diagnosis not present

## 2023-03-18 DIAGNOSIS — J069 Acute upper respiratory infection, unspecified: Secondary | ICD-10-CM

## 2023-03-18 MED ORDER — AZITHROMYCIN 250 MG PO TABS
ORAL_TABLET | ORAL | 0 refills | Status: AC
Start: 2023-03-18 — End: 2023-03-23

## 2023-03-18 MED ORDER — PROMETHAZINE-DM 6.25-15 MG/5ML PO SYRP
5.0000 mL | ORAL_SOLUTION | Freq: Four times a day (QID) | ORAL | 0 refills | Status: DC | PRN
Start: 2023-03-18 — End: 2023-12-05

## 2023-03-18 NOTE — Progress Notes (Signed)
 Acute Office Visit  Subjective:    Patient ID: Michelle Pace, female    DOB: May 13, 1980, 43 y.o.   MRN: [id number]  Chief Complaint  Patient presents with   URI    Cough with yellow mucus, congestion in head and throat for one week. Has been thaking OTC cold/flu medication     HPI The patient presents today with complaints of nasal congestion, sneezing, runny nose, cough, facial pain, and pressure, which have been ongoing for about a week. She denies nausea, vomiting, diarrhea, fever, chills, sore throat, or body aches. She has been treating her symptoms with an over-the-counter regimen of Mucinex but reports minimal relief.   Past Medical History:  Diagnosis Date   Hypertension    Panic attacks     Past Surgical History:  Procedure Laterality Date   TUBAL LIGATION N/A 07/14/2020   Procedure: POST PARTUM TUBAL LIGATION;  Surgeon: Venora Maples, MD;  Location: [facility];  Service: Gynecology;  Laterality: N/A;   WISDOM TOOTH EXTRACTION  11/2011    Family History  Problem Relation Age of Onset   Stroke Maternal Grandmother    Cancer Father    Other Mother        brain tumor   Dementia Mother    Asthma Son     Social History   Socioeconomic History   Marital status: Significant Other    Spouse name: Not on file   Number of children: 3   Years of education: Not on file   Highest education level: Not on file  Occupational History   Not on file  Tobacco Use   Smoking status: Never   Smokeless tobacco: Never  Vaping Use   Vaping status: Never Used  Substance and Sexual Activity   Alcohol use: No   Drug use: No   Sexual activity: Yes    Birth control/protection: Surgical    Comment: tubal ligation  Other Topics Concern   Not on file  Social History Narrative   Not on file   Social Drivers of Health   Financial Resource Strain: Low Risk  (12/28/2022)   Overall Financial Resource Strain (CARDIA)    Difficulty of Paying Living Expenses: Not hard at  all  Food Insecurity: No Food Insecurity (12/28/2022)   Hunger Vital Sign    Worried About Running Out of Food in the Last Year: Never true    Ran Out of Food in the Last Year: Never true  Transportation Needs: No Transportation Needs (12/28/2022)   PRAPARE - Administrator, Civil Service (Medical): No    Lack of Transportation (Non-Medical): No  Physical Activity: Insufficiently Active (12/28/2022)   Exercise Vital Sign    Days of Exercise per Week: 2 days    Minutes of Exercise per Session: 10 min  Stress: No Stress Concern Present (12/28/2022)   [employer] of Occupational Health - Occupational Stress Questionnaire    Feeling of Stress : Not at all  Social Connections: Socially Isolated (12/28/2022)   Social Connection and Isolation Panel [NHANES]    Frequency of Communication with Friends and Family: More than three times a week    Frequency of Social Gatherings with Friends and Family: Twice a week    Attends Religious Services: Never    Database administrator or Organizations: No    Attends Banker Meetings: Never    Marital Status: Never married  Intimate Partner Violence: Not At Risk (12/28/2022)   Humiliation, Afraid,  Rape, and Kick questionnaire    Fear of Current or Ex-Partner: No    Emotionally Abused: No    Physically Abused: No    Sexually Abused: No    Outpatient Medications Prior to Visit  Medication Sig Dispense Refill   acetaminophen (TYLENOL) 500 MG tablet Take 2 tablets (1,000 mg total) by mouth every 6 (six) hours as needed for mild pain, moderate pain, fever or headache. (Patient not taking: Reported on 12/28/2022) 30 tablet 0   escitalopram (LEXAPRO) 20 MG tablet TAKE 1 TABLET(20 MG) BY MOUTH DAILY 30 tablet 11   fluticasone (FLONASE) 50 MCG/ACT nasal spray Place 1 spray into both nostrils 2 (two) times daily. (Patient not taking: Reported on 12/28/2022) 16 g 2   ibuprofen (ADVIL) 800 MG tablet Take 1 tablet (800 mg total) by  mouth 3 (three) times daily. 30 tablet 0   UNABLE TO FIND Med Name: Blood Pressure Cuff R03.0 (Patient not taking: Reported on 12/28/2022) 1 each 0   valsartan (DIOVAN) 80 MG tablet TAKE 1 TABLET(80 MG) BY MOUTH DAILY 90 tablet 1   Vitamin D, Ergocalciferol, (DRISDOL) 1.25 MG (50000 UNIT) CAPS capsule Take 50,000 Units by mouth every 7 (seven) days. (Patient not taking: Reported on 12/28/2022)     promethazine-dextromethorphan (PROMETHAZINE-DM) 6.25-15 MG/5ML syrup Take 5 mLs by mouth 4 (four) times daily as needed. (Patient not taking: Reported on 12/28/2022) 118 mL 0   No facility-administered medications prior to visit.    No Known Allergies  Review of Systems  Constitutional:  Negative for chills and fever.  HENT:  Positive for congestion, sinus pressure and sinus pain. Negative for sore throat.   Eyes:  Negative for visual disturbance.  Respiratory:  Negative for chest tightness and shortness of breath.   Neurological:  Negative for dizziness and headaches.       Objective:    Physical Exam HENT:     Head: Normocephalic.     Nose:     Right Sinus: Maxillary sinus tenderness present.     Left Sinus: Maxillary sinus tenderness present.     Mouth/Throat:     Mouth: Mucous membranes are moist.  Cardiovascular:     Rate and Rhythm: Normal rate.     Heart sounds: Normal heart sounds.  Pulmonary:     Effort: Pulmonary effort is normal.     Breath sounds: Normal breath sounds.  Neurological:     Mental Status: She is alert.     BP (!) 138/94 (BP Location: Left Arm, Patient Position: Sitting, Cuff Size: Normal)   Pulse 98   Ht 5' (1.524 m)   Wt 168 lb (76.2 kg)   SpO2 94%   BMI 32.81 kg/m  Wt Readings from Last 3 Encounters:  03/18/23 168 lb (76.2 kg)  12/28/22 169 lb (76.7 kg)  12/24/22 170 lb (77.1 kg)       Assessment & Plan:  Subacute maxillary sinusitis Assessment & Plan: Therapy will be initiated with Azithromycin, and the patient is encouraged to complete  the full course of the prescribed antibiotic. Additionally, the patient is advised to take Promethazine DM, 5 mL by mouth every 4 hours as needed for cough and cold symptoms, increase fluid intake, and allow for plenty of rest. Tylenol may be taken as needed for pain, fever, or general discomfort. A humidifier is recommended at bedtime to help with cough and nasal congestion.  For nasal congestion, the use of heated humidified air is a safe and effective therapy, and saline  nasal sprays may help alleviate symptoms. For cough relief, honey may help reduce both frequency and severity. The patient is advised to follow up if symptoms do not improve.   Orders: -     Azithromycin; Take 2 tablets on day 1, then 1 tablet daily on days 2 through 5  Dispense: 6 tablet; Refill: 0 -     Promethazine-DM; Take 5 mLs by mouth 4 (four) times daily as needed.  Dispense: 118 mL; Refill: 0   Note: This chart has been completed using Engineer, civil (consulting) software, and while attempts have been made to ensure accuracy, certain words and phrases may not be transcribed as intended.   Gilmore Laroche, FNP

## 2023-03-18 NOTE — Patient Instructions (Signed)
 I appreciate the opportunity to provide care to you today!    Sinusitis -please complete the full course of the antibiotic prescribed Start taking Promethazine DM 5 mL by mouth every 4 hours as needed for cough and cold symptoms. Increase fluid intake and allow for plenty of rest. Take Tylenol as needed for pain, fever, or general discomfort. Use a humidifier at bedtime to help with cough and nasal congestion. For nasal congestion: The use of heated humidified air is a safe and effective therapy. Saline nasal sprays may also help alleviate nasal symptoms of the common cold. For cough: Treatment with honey may reduce cough frequency and severity. Follow up if your symptoms do not improve.   Please continue to a heart-healthy diet and increase your physical activities. Try to exercise for at least five days a week.    It was a pleasure to see you and I look forward to continuing to work together on your health and well-being. Please do not hesitate to call the office if you need care or have questions about your care.  In case of emergency, please visit the Emergency Department for urgent care, or contact our clinic at [phone] to schedule an appointment. We're here to help you!   Have a wonderful day and week. With Gratitude, Gilmore Laroche MSN, FNP-BC

## 2023-03-19 DIAGNOSIS — J0100 Acute maxillary sinusitis, unspecified: Secondary | ICD-10-CM | POA: Insufficient documentation

## 2023-03-19 NOTE — Assessment & Plan Note (Signed)
 Therapy will be initiated with Azithromycin, and the patient is encouraged to complete the full course of the prescribed antibiotic. Additionally, the patient is advised to take Promethazine DM, 5 mL by mouth every 4 hours as needed for cough and cold symptoms, increase fluid intake, and allow for plenty of rest. Tylenol may be taken as needed for pain, fever, or general discomfort. A humidifier is recommended at bedtime to help with cough and nasal congestion.  For nasal congestion, the use of heated humidified air is a safe and effective therapy, and saline nasal sprays may help alleviate symptoms. For cough relief, honey may help reduce both frequency and severity. The patient is advised to follow up if symptoms do not improve.

## 2023-05-02 ENCOUNTER — Ambulatory Visit: Payer: Medicaid Other | Admitting: Family Medicine

## 2023-06-13 ENCOUNTER — Encounter: Payer: Self-pay | Admitting: Family Medicine

## 2023-08-25 ENCOUNTER — Other Ambulatory Visit: Payer: Self-pay | Admitting: Family Medicine

## 2023-08-25 DIAGNOSIS — I10 Essential (primary) hypertension: Secondary | ICD-10-CM

## 2023-08-27 ENCOUNTER — Other Ambulatory Visit: Payer: Self-pay | Admitting: Family Medicine

## 2023-08-27 DIAGNOSIS — I10 Essential (primary) hypertension: Secondary | ICD-10-CM

## 2023-08-29 ENCOUNTER — Other Ambulatory Visit: Payer: Self-pay | Admitting: Family Medicine

## 2023-08-29 DIAGNOSIS — I10 Essential (primary) hypertension: Secondary | ICD-10-CM

## 2023-09-05 ENCOUNTER — Encounter: Payer: Self-pay | Admitting: Family Medicine

## 2023-09-05 ENCOUNTER — Ambulatory Visit (INDEPENDENT_AMBULATORY_CARE_PROVIDER_SITE_OTHER): Admitting: Family Medicine

## 2023-09-05 VITALS — BP 143/92 | HR 87 | Ht 60.0 in | Wt 165.0 lb

## 2023-09-05 DIAGNOSIS — I10 Essential (primary) hypertension: Secondary | ICD-10-CM

## 2023-09-05 DIAGNOSIS — G4489 Other headache syndrome: Secondary | ICD-10-CM | POA: Diagnosis not present

## 2023-09-05 DIAGNOSIS — E559 Vitamin D deficiency, unspecified: Secondary | ICD-10-CM

## 2023-09-05 DIAGNOSIS — F410 Panic disorder [episodic paroxysmal anxiety] without agoraphobia: Secondary | ICD-10-CM | POA: Diagnosis not present

## 2023-09-05 DIAGNOSIS — E7849 Other hyperlipidemia: Secondary | ICD-10-CM

## 2023-09-05 DIAGNOSIS — E038 Other specified hypothyroidism: Secondary | ICD-10-CM

## 2023-09-05 DIAGNOSIS — R7301 Impaired fasting glucose: Secondary | ICD-10-CM

## 2023-09-05 MED ORDER — HYDROXYZINE PAMOATE 25 MG PO CAPS: 25.0000 mg | ORAL_CAPSULE | Freq: Three times a day (TID) | ORAL | 1 refills | Status: DC | PRN

## 2023-09-05 NOTE — Progress Notes (Unsigned)
 Established Patient Office Visit  Subjective:  Patient ID: Michelle Pace, female    DOB: Jan 21, 1980  Age: 43 y.o. MRN: [id number]  CC:  Chief Complaint  Patient presents with   Headache    Headaches off and on    HPI Michelle Pace is a 43 y.o. female with past medical history of HTN, panic attacks, headaches presents for f/u of  chronic medical conditions.  The patient complains of headaches rated 5/10. She reports that taking Advil  provides relief. She denies sensitivity to light or noise, as well as nausea or vomiting associated with the headaches. She denies any recent head injury or trauma. No fever, neck stiffness, or changes in the severity of the headaches are reported.   Past Medical History:  Diagnosis Date   Hypertension    Panic attacks     Past Surgical History:  Procedure Laterality Date   TUBAL LIGATION N/A 07/14/2020   Procedure: POST PARTUM TUBAL LIGATION;  Surgeon: Lola Donnice HERO, MD;  Location: [facility];  Service: Gynecology;  Laterality: N/A;   WISDOM TOOTH EXTRACTION  11/2011    Family History  Problem Relation Age of Onset   Stroke Maternal Grandmother    Cancer Father    Other Mother        brain tumor   Dementia Mother    Asthma Son     Social History   Socioeconomic History   Marital status: Significant Other    Spouse name: Not on file   Number of children: 3   Years of education: Not on file   Highest education level: 12th grade  Occupational History   Not on file  Tobacco Use   Smoking status: Never   Smokeless tobacco: Never  Vaping Use   Vaping status: Never Used  Substance and Sexual Activity   Alcohol use: No   Drug use: No   Sexual activity: Yes    Birth control/protection: Surgical    Comment: tubal ligation  Other Topics Concern   Not on file  Social History Narrative   Not on file   Social Drivers of Health   Financial Resource Strain: Low Risk  (09/05/2023)   Overall Financial Resource Strain  (CARDIA)    Difficulty of Paying Living Expenses: Not hard at all  Food Insecurity: Food Insecurity Present (09/05/2023)   Hunger Vital Sign    Worried About Running Out of Food in the Last Year: Sometimes true    Ran Out of Food in the Last Year: Never true  Transportation Needs: No Transportation Needs (09/05/2023)   PRAPARE - Administrator, Civil Service (Medical): No    Lack of Transportation (Non-Medical): No  Physical Activity: Insufficiently Active (09/05/2023)   Exercise Vital Sign    Days of Exercise per Week: 1 day    Minutes of Exercise per Session: 10 min  Stress: No Stress Concern Present (09/05/2023)   [employer] of Occupational Health - Occupational Stress Questionnaire    Feeling of Stress: Only a little  Social Connections: Moderately Isolated (09/05/2023)   Social Connection and Isolation Panel    Frequency of Communication with Friends and Family: More than three times a week    Frequency of Social Gatherings with Friends and Family: Once a week    Attends Religious Services: Never    Database administrator or Organizations: No    Attends Engineer, structural: Not on file    Marital Status: Living with partner  Intimate Partner Violence: Not At Risk (12/28/2022)   Humiliation, Afraid, Rape, and Kick questionnaire    Fear of Current or Ex-Partner: No    Emotionally Abused: No    Physically Abused: No    Sexually Abused: No    Outpatient Medications Prior to Visit  Medication Sig Dispense Refill   acetaminophen  (TYLENOL ) 500 MG tablet Take 2 tablets (1,000 mg total) by mouth every 6 (six) hours as needed for mild pain, moderate pain, fever or headache. (Patient not taking: Reported on 12/28/2022) 30 tablet 0   escitalopram  (LEXAPRO ) 20 MG tablet TAKE 1 TABLET(20 MG) BY MOUTH DAILY 30 tablet 11   fluticasone  (FLONASE ) 50 MCG/ACT nasal spray Place 1 spray into both nostrils 2 (two) times daily. (Patient not taking: Reported on 12/28/2022) 16  g 2   ibuprofen  (ADVIL ) 800 MG tablet Take 1 tablet (800 mg total) by mouth 3 (three) times daily. 30 tablet 0   promethazine -dextromethorphan (PROMETHAZINE -DM) 6.25-15 MG/5ML syrup Take 5 mLs by mouth 4 (four) times daily as needed. 118 mL 0   UNABLE TO FIND Med Name: Blood Pressure Cuff R03.0 (Patient not taking: Reported on 12/28/2022) 1 each 0   valsartan  (DIOVAN ) 80 MG tablet TAKE 1 TABLET(80 MG) BY MOUTH DAILY 90 tablet 1   Vitamin D , Ergocalciferol , (DRISDOL) 1.25 MG (50000 UNIT) CAPS capsule Take 50,000 Units by mouth every 7 (seven) days. (Patient not taking: Reported on 12/28/2022)     No facility-administered medications prior to visit.    No Known Allergies  ROS Review of Systems  Constitutional:  Negative for chills and fever.  Eyes:  Negative for visual disturbance.  Respiratory:  Negative for chest tightness and shortness of breath.   Neurological:  Positive for headaches. Negative for dizziness.      Objective:    Physical Exam HENT:     Head: Normocephalic.     Mouth/Throat:     Mouth: Mucous membranes are moist.  Cardiovascular:     Rate and Rhythm: Normal rate.     Heart sounds: Normal heart sounds.  Pulmonary:     Effort: Pulmonary effort is normal.     Breath sounds: Normal breath sounds.  Neurological:     Mental Status: She is alert.     BP 138/72   Pulse 87   Ht 5' (1.524 m)   Wt 165 lb (74.8 kg)   SpO2 99%   BMI 32.22 kg/m  Wt Readings from Last 3 Encounters:  09/05/23 165 lb (74.8 kg)  03/18/23 168 lb (76.2 kg)  12/28/22 169 lb (76.7 kg)    Lab Results  Component Value Date   TSH 0.857 05/31/2022   Lab Results  Component Value Date   WBC 8.4 05/31/2022   HGB 13.1 05/31/2022   HCT 40.5 05/31/2022   MCV 90 05/31/2022   PLT 253 05/31/2022   Lab Results  Component Value Date   NA 141 08/23/2022   K 3.4 (L) 08/23/2022   CO2 21 08/23/2022   GLUCOSE 126 (H) 08/23/2022   BUN 12 08/23/2022   CREATININE 0.91 08/23/2022   BILITOT  0.4 05/31/2022   ALKPHOS 96 05/31/2022   AST 13 05/31/2022   ALT 10 05/31/2022   PROT 6.5 05/31/2022   ALBUMIN 4.0 05/31/2022   CALCIUM 8.9 08/23/2022   ANIONGAP 9 07/13/2020   EGFR 82 08/23/2022   Lab Results  Component Value Date   CHOL 166 05/31/2022   Lab Results  Component Value Date   HDL 43  05/31/2022   Lab Results  Component Value Date   LDLCALC 105 (H) 05/31/2022   Lab Results  Component Value Date   TRIG 99 05/31/2022   Lab Results  Component Value Date   CHOLHDL 3.9 05/31/2022   Lab Results  Component Value Date   HGBA1C 5.3 05/31/2022      Assessment & Plan:  Primary hypertension Assessment & Plan: Controlled She takes valsartan  80 mg daily A low-sodium diet of less than 2,300 mg daily is recommended, along with moderate-intensity physical activity for at least 150 minutes per week. The patient is encouraged to maintain these lifestyle modifications to help manage her blood pressure effectively.  Long-term considerations were discussed, emphasizing that uncontrolled hypertension increases the risk of cardiovascular diseases, including stroke, coronary artery disease, and heart failure.  The patient is encouraged to seek emergency care if blood pressure exceeds 180/120 and is accompanied by symptoms such as headaches, chest pain, palpitations, blurred vision, or dizziness. She verbalized understanding and will follow up as scheduled.  BP Readings from Last 3 Encounters:  09/05/23 138/72  03/18/23 (!) 138/94  12/28/22 (!) 133/95      Panic attacks Assessment & Plan: The patient reports experiencing nausea, describing it as feeling sick to her stomach as if she were going to vomit, though she does not. She believes this may be attributed to her anxiety. She reports compliance with Lexapro  20 mg daily. She denies suicidal ideation, homicidal ideation, or auditory/visual hallucinations.  Initiate hydroxyzine  25 mg every 8 hours as needed for anxiety.  The patient was informed that if she experiences daytime drowsiness, she may take half a tablet (12.5 mg) during the day instead. Encourage continuation of Lexapro  20 mg daily. Reinforce the use of nonpharmacological interventions, including relaxation techniques, stress reduction strategies, adequate sleep hygiene, regular exercise, and mindfulness practices.   Orders: -     hydrOXYzine  Pamoate; Take 1 capsule (25 mg total) by mouth every 8 (eight) hours as needed.  Dispense: 30 capsule; Refill: 1  Other headache syndrome Assessment & Plan: Nonpharmacological interventions for headaches include maintaining adequate hydration, ensuring regular sleep, practicing stress management, and avoiding known triggers. The patient is encouraged to continue using over-the-counter Advil  as needed. Red flag symptoms of headaches were reviewed and discussed, including sudden onset of severe headache ("thunderclap"), new neurological deficits, vision changes, persistent vomiting, fever with neck stiffness, or worsening severity/frequency of headaches.    Vitamin D  deficiency -     VITAMIN D  25 Hydroxy (Vit-D Deficiency, Fractures)  IFG (impaired fasting glucose) -     Hemoglobin A1c  TSH (thyroid-stimulating hormone deficiency) -     TSH + free T4  Other hyperlipidemia -     Lipid panel -     CMP14+EGFR -     CBC with Differential/Platelet   Note: This chart has been completed using Engineer, civil (consulting) software, and while attempts have been made to ensure accuracy, certain words and phrases may not be transcribed as intended.   Follow-up: No follow-ups on file.   Zayveon Raschke, FNP

## 2023-09-05 NOTE — Patient Instructions (Addendum)
 I appreciate the opportunity to provide care to you today!    Follow up:  4 months and 1 month f/u for bp if with the nurse if elevated  Labs: please stop by the lab during the week to get your blood drawn (CBC, CMP, TSH, Lipid profile, HgA1c, Vit D)  Anxiety Start taking hydroxyzine  25 mg every 8 hours as needed for anxiety. If you experience daytime drowsiness, take half a tablet (12.5 mg) during the day instead.   Red Flag Headache Symptoms Requiring Urgent Care: -Sudden, Severe Headache -Described as "the worst headache of my life" -Especially concerning if there's no prior history of similar headaches -Could indicate subarachnoid hemorrhage or other serious pathology -Progressively Worsening or Unrelenting Headache -Does not improve with rest, hydration, or typical pain relief -May signal an evolving intracranial process -Sharp, Stabbing (Ice-Pick or Lancinating) Pain -Especially if frequent, intense, or with sudden onset -Headache + Stiff Neck and/or Fever -Concerning for meningitis or central nervous system infection -Headache + Changes in Mental Status or Consciousness -Includes confusion, drowsiness, personality change, seizures, or fainting -Headache After Head or Neck Trauma -Even mild trauma, if followed by persistent or worsening symptoms -Risk of intracranial bleeding, concussion, or cervical injury -New or Changing Headache Pattern in Chronic Headache Sufferers -Significant deviation from usual pattern or intensity -May represent a new underlying cause  Please follow up if your symptoms worsen or fail to improve.  Attached with your AVS, you will find valuable resources for self-education. I highly recommend dedicating some time to thoroughly examine them.   Please continue to a heart-healthy diet and increase your physical activities. Try to exercise for at least five days a week.    It was a pleasure to see you and I look forward to continuing to work  together on your health and well-being. Please do not hesitate to call the office if you need care or have questions about your care.  In case of emergency, please visit the Emergency Department for urgent care, or contact our clinic at [phone] to schedule an appointment. We're here to help you!   Have a wonderful day and week. With Gratitude, Carren Blakley MSN, FNP-BC

## 2023-09-06 NOTE — Assessment & Plan Note (Signed)
 Nonpharmacological interventions for headaches include maintaining adequate hydration, ensuring regular sleep, practicing stress management, and avoiding known triggers. The patient is encouraged to continue using over-the-counter Advil  as needed. Red flag symptoms of headaches were reviewed and discussed, including sudden onset of severe headache ("thunderclap"), new neurological deficits, vision changes, persistent vomiting, fever with neck stiffness, or worsening severity/frequency of headaches.

## 2023-09-06 NOTE — Assessment & Plan Note (Signed)
 Controlled She takes valsartan  80 mg daily A low-sodium diet of less than 2,300 mg daily is recommended, along with moderate-intensity physical activity for at least 150 minutes per week. The patient is encouraged to maintain these lifestyle modifications to help manage her blood pressure effectively.  Long-term considerations were discussed, emphasizing that uncontrolled hypertension increases the risk of cardiovascular diseases, including stroke, coronary artery disease, and heart failure.  The patient is encouraged to seek emergency care if blood pressure exceeds 180/120 and is accompanied by symptoms such as headaches, chest pain, palpitations, blurred vision, or dizziness. She verbalized understanding and will follow up as scheduled.  BP Readings from Last 3 Encounters:  09/05/23 138/72  03/18/23 (!) 138/94  12/28/22 (!) 133/95

## 2023-09-06 NOTE — Assessment & Plan Note (Signed)
 The patient reports experiencing nausea, describing it as feeling sick to her stomach as if she were going to vomit, though she does not. She believes this may be attributed to her anxiety. She reports compliance with Lexapro  20 mg daily. She denies suicidal ideation, homicidal ideation, or auditory/visual hallucinations.  Initiate hydroxyzine  25 mg every 8 hours as needed for anxiety. The patient was informed that if she experiences daytime drowsiness, she may take half a tablet (12.5 mg) during the day instead. Encourage continuation of Lexapro  20 mg daily. Reinforce the use of nonpharmacological interventions, including relaxation techniques, stress reduction strategies, adequate sleep hygiene, regular exercise, and mindfulness practices.

## 2023-09-07 ENCOUNTER — Encounter: Payer: Self-pay | Admitting: Family Medicine

## 2023-09-07 LAB — CBC WITH DIFFERENTIAL/PLATELET
Basophils Absolute: 0 x10E3/uL (ref 0.0–0.2)
Basos: 0 %
EOS (ABSOLUTE): 0.2 x10E3/uL (ref 0.0–0.4)
Eos: 3 %
Hematocrit: 40.1 % (ref 34.0–46.6)
Hemoglobin: 12.9 g/dL (ref 11.1–15.9)
Immature Grans (Abs): 0 x10E3/uL (ref 0.0–0.1)
Immature Granulocytes: 0 %
Lymphocytes Absolute: 2.7 x10E3/uL (ref 0.7–3.1)
Lymphs: 36 %
MCH: 29.5 pg (ref 26.6–33.0)
MCHC: 32.2 g/dL (ref 31.5–35.7)
MCV: 92 fL (ref 79–97)
Monocytes Absolute: 0.5 x10E3/uL (ref 0.1–0.9)
Monocytes: 7 %
Neutrophils Absolute: 4.1 x10E3/uL (ref 1.4–7.0)
Neutrophils: 54 %
Platelets: 270 x10E3/uL (ref 150–450)
RBC: 4.37 x10E6/uL (ref 3.77–5.28)
RDW: 12.9 % (ref 11.7–15.4)
WBC: 7.6 x10E3/uL (ref 3.4–10.8)

## 2023-09-07 LAB — LIPID PANEL
Chol/HDL Ratio: 4.4 ratio (ref 0.0–4.4)
Cholesterol, Total: 176 mg/dL (ref 100–199)
HDL: 40 mg/dL (ref >39)
LDL Chol Calc (NIH): 117 mg/dL — ABNORMAL HIGH (ref 0–99)
Triglycerides: 106 mg/dL (ref 0–149)
VLDL Cholesterol Cal: 19 mg/dL (ref 5–40)

## 2023-09-07 LAB — VITAMIN D 25 HYDROXY (VIT D DEFICIENCY, FRACTURES): Vit D, 25-Hydroxy: 31.1 ng/mL (ref 30.0–100.0)

## 2023-09-07 LAB — CMP14+EGFR
ALT: 9 IU/L (ref 0–32)
AST: 15 IU/L (ref 0–40)
Albumin: 4.1 g/dL (ref 3.9–4.9)
Alkaline Phosphatase: 98 IU/L (ref 44–121)
BUN/Creatinine Ratio: 10 (ref 9–23)
BUN: 8 mg/dL (ref 6–24)
Bilirubin Total: 0.4 mg/dL (ref 0.0–1.2)
CO2: 21 mmol/L (ref 20–29)
Calcium: 9.1 mg/dL (ref 8.7–10.2)
Chloride: 104 mmol/L (ref 96–106)
Creatinine, Ser: 0.77 mg/dL (ref 0.57–1.00)
Globulin, Total: 2.5 g/dL (ref 1.5–4.5)
Glucose: 90 mg/dL (ref 70–99)
Potassium: 3.7 mmol/L (ref 3.5–5.2)
Sodium: 140 mmol/L (ref 134–144)
Total Protein: 6.6 g/dL (ref 6.0–8.5)
eGFR: 99 mL/min/1.73 (ref >59)

## 2023-09-07 LAB — HEMOGLOBIN A1C
Est. average glucose Bld gHb Est-mCnc: 114 mg/dL
Hgb A1c MFr Bld: 5.6 % (ref 4.8–5.6)

## 2023-09-07 LAB — TSH+FREE T4
Free T4: 1.13 ng/dL (ref 0.82–1.77)
TSH: 1.02 u[IU]/mL (ref 0.450–4.500)

## 2023-10-07 ENCOUNTER — Ambulatory Visit (INDEPENDENT_AMBULATORY_CARE_PROVIDER_SITE_OTHER)

## 2023-10-07 VITALS — BP 137/85

## 2023-10-07 DIAGNOSIS — I10 Essential (primary) hypertension: Secondary | ICD-10-CM

## 2023-10-07 DIAGNOSIS — O10919 Unspecified pre-existing hypertension complicating pregnancy, unspecified trimester: Secondary | ICD-10-CM

## 2023-10-07 NOTE — Progress Notes (Signed)
 Patient is in office today for a nurse visit for Blood Pressure Check. Patient blood pressure was 137/85, Patient No chest pain, No shortness of breath, No dyspnea on exertion  Was 125/78 at home this AM

## 2023-11-14 ENCOUNTER — Encounter: Payer: Self-pay | Admitting: Family Medicine

## 2023-11-25 ENCOUNTER — Encounter: Payer: Self-pay | Admitting: Family Medicine

## 2023-12-02 ENCOUNTER — Other Ambulatory Visit: Payer: Self-pay | Admitting: Family Medicine

## 2023-12-02 DIAGNOSIS — I10 Essential (primary) hypertension: Secondary | ICD-10-CM

## 2023-12-05 ENCOUNTER — Encounter: Payer: Self-pay | Admitting: Family Medicine

## 2023-12-05 ENCOUNTER — Ambulatory Visit (INDEPENDENT_AMBULATORY_CARE_PROVIDER_SITE_OTHER): Payer: Self-pay | Admitting: Family Medicine

## 2023-12-05 VITALS — BP 122/82 | HR 80 | Temp 98.2°F | Ht 60.0 in | Wt 170.0 lb

## 2023-12-05 DIAGNOSIS — I10 Essential (primary) hypertension: Secondary | ICD-10-CM | POA: Diagnosis not present

## 2023-12-05 DIAGNOSIS — L729 Follicular cyst of the skin and subcutaneous tissue, unspecified: Secondary | ICD-10-CM

## 2023-12-05 NOTE — Progress Notes (Signed)
   Subjective:    Patient ID: Michelle Pace, female    DOB: 03-23-1980, 43 y.o.   MRN: [id number] Cyst on corner of R eyelid. Pt would like to get it removed.  HPI Patient with a cyst on the right inner aspect of the eye not causing any pain or discomfort but been progressively getting larger it is bothersome to the patient she would like to have it looked at by a specialist and hopefully removed   Review of Systems     Objective:   Physical Exam She does have a benign mole on the left eyebrow she also has an enlarging cyst in the inner aspect of the eye on the right side Rest of facial exam is normal lungs clear blood pressure on recheck to better       Assessment & Plan:   HTN-follow through with seeing her provider next month as planned patient will continue medication  Cyst-referral to dermatology for evaluation and possible removal

## 2023-12-06 ENCOUNTER — Other Ambulatory Visit: Payer: Self-pay

## 2023-12-06 DIAGNOSIS — L729 Follicular cyst of the skin and subcutaneous tissue, unspecified: Secondary | ICD-10-CM

## 2023-12-06 NOTE — Progress Notes (Signed)
 Referral placed.

## 2023-12-07 ENCOUNTER — Telehealth: Payer: Self-pay | Admitting: Family Medicine

## 2023-12-07 ENCOUNTER — Other Ambulatory Visit: Payer: Self-pay

## 2023-12-07 DIAGNOSIS — L729 Follicular cyst of the skin and subcutaneous tissue, unspecified: Secondary | ICD-10-CM

## 2023-12-07 NOTE — Telephone Encounter (Signed)
 Nurses I saw the patient 2 days ago Patient had a cyst I recommend referral to plastic surgery Please cancel referral to dermatology Dr. Violet with [facility] plastic surgery states that they will be willing to see the patient thank you  Please put in new referral Cancel other referral

## 2023-12-07 NOTE — Telephone Encounter (Signed)
 Referral placed and message has been sent to referral staff

## 2023-12-08 NOTE — Progress Notes (Signed)
 Referral has been placed.

## 2023-12-25 ENCOUNTER — Ambulatory Visit
Admission: EM | Admit: 2023-12-25 | Discharge: 2023-12-25 | Disposition: A | Discharge status: Home / Self Care | Attending: Family Medicine | Admitting: Family Medicine

## 2023-12-25 DIAGNOSIS — R030 Elevated blood-pressure reading, without diagnosis of hypertension: Secondary | ICD-10-CM

## 2023-12-25 DIAGNOSIS — J069 Acute upper respiratory infection, unspecified: Secondary | ICD-10-CM | POA: Diagnosis not present

## 2023-12-25 MED ORDER — AZELASTINE HCL 0.1 % NA SOLN: 1.0000 | Freq: Two times a day (BID) | NASAL | 0 refills | Status: DC

## 2023-12-25 NOTE — ED Triage Notes (Signed)
 Pt reports she has a cough, nasal congestion with post nasal drip x 1 week   Took nyquil

## 2023-12-25 NOTE — Discharge Instructions (Signed)
 I have prescribed a nasal spray to help with your symptoms and in addition recommend saline sinus rinses several times daily as needed, Coricidin HBP, plain Mucinex, Tylenol  as needed.  These medications will not further elevate your blood pressure.  Follow-up for worsening or unresolving symptoms.

## 2023-12-25 NOTE — ED Provider Notes (Signed)
 [facility]    CSN: [id number] Arrival date & time: 12/25/23  1138      History   Chief Complaint No chief complaint on file.   HPI Michelle Pace is a 43 y.o. female.   Patient presenting today with 1 week history of cough, nasal congestion.  Denies fever, chills, chest pain, shortness of breath, abdominal pain, vomiting, diarrhea.  Trying NyQuil with minimal relief.    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   History of bilateral tubal ligation 07/14/2020   Chronic hypertension affecting pregnancy 04/02/2020   Panic attacks 01/16/2020    Past Surgical History:  Procedure Laterality Date   TUBAL LIGATION N/A 07/14/2020   Procedure: POST PARTUM TUBAL LIGATION;  Surgeon: Lola Donnice HERO, MD;  Location: [facility];  Service: Gynecology;  Laterality: N/A;   WISDOM TOOTH EXTRACTION  11/2011    OB History     Gravida  4   Para  3   Term  3   Preterm      AB  1   Living  3      SAB  1   IAB      Ectopic      Multiple  0   Live Births  3            Home Medications    Prior to Admission medications   Medication Sig Start Date End Date Taking? Authorizing Provider  azelastine  (ASTELIN ) 0.1 % nasal spray Place 1 spray into both nostrils 2 (two) times daily. Use in each nostril as directed 12/25/23  Yes Stuart Vernell Norris, PA-C  acetaminophen  (TYLENOL ) 500 MG tablet Take 2 tablets (1,000 mg total) by mouth every 6 (six) hours as needed for mild pain, moderate  pain, fever or headache. 07/15/20   Myrl Therisa BRAVO, MD  escitalopram  (LEXAPRO ) 20 MG tablet TAKE 1 TABLET(20 MG) BY MOUTH DAILY 01/14/23   Jayne Vonn DEL, MD  fluticasone  (FLONASE ) 50 MCG/ACT nasal spray Place 1 spray into both nostrils 2 (two) times daily. Patient not taking: Reported on 12/05/2023 01/29/22   Stuart Vernell Norris, PA-C  hydrOXYzine  (VISTARIL ) 25 MG capsule Take 1 capsule (25 mg total) by mouth every 8 (eight) hours as needed. 09/05/23   Bacchus, Meade PEDLAR, FNP  ibuprofen  (ADVIL ) 800 MG tablet Take 1 tablet (800 mg total) by mouth 3 (three) times daily. 08/21/22   Leath-Warren, Etta PARAS, NP  UNABLE TO FIND Med Name: Blood Pressure Cuff R03.0 Patient not taking: Reported on 12/05/2023 05/31/22   Edman Meade PEDLAR, FNP  valsartan  (DIOVAN ) 80 MG tablet TAKE 1 TABLET(80 MG) BY MOUTH DAILY 12/05/23   Bacchus, Gloria Z, FNP  Vitamin D , Ergocalciferol , (DRISDOL) 1.25 MG (50000 UNIT) CAPS capsule Take 50,000 Units by mouth every 7 (seven) days. Patient not taking: Reported on 12/05/2023    [provider]    Family History Family History  Problem Relation Age of Onset   Stroke Maternal Grandmother    Cancer Father    Other Mother  brain tumor   Dementia Mother    Asthma Son     Social History Social History   Tobacco Use   Smoking status: Never   Smokeless tobacco: Never  Vaping Use   Vaping status: Never Used  Substance Use Topics   Alcohol use: No   Drug use: No     Allergies   Patient has no known allergies.   Review of Systems Review of Systems Per HPI  Physical Exam Triage Vital Signs ED Triage Vitals  Encounter Vitals Group     BP 12/25/23 1154 (!) 149/103     Girls Systolic BP Percentile --      Girls Diastolic BP Percentile --      Boys Systolic BP Percentile --      Boys Diastolic BP Percentile --      Pulse Rate 12/25/23 1154 93     Resp 12/25/23 1154 20     Temp 12/25/23 1154 98.6 F (37 C)     Temp Source 12/25/23 1154 Oral      SpO2 12/25/23 1154 98 %     Weight --      Height --      Head Circumference --      Peak Flow --      Pain Score 12/25/23 1156 0     Pain Loc --      Pain Education --      Exclude from Growth Chart --    No data found.  Updated Vital Signs BP (!) 141/87 (BP Location: Right Arm)   Pulse 93   Temp 98.6 F (37 C) (Oral)   Resp 20   LMP 11/25/2023   SpO2 98%   Visual Acuity Right Eye Distance:   Left Eye Distance:   Bilateral Distance:    Right Eye Near:   Left Eye Near:    Bilateral Near:     Physical Exam Vitals and nursing note reviewed.  Constitutional:      Appearance: Normal appearance.  HENT:     Head: Atraumatic.     Right Ear: Tympanic membrane and external ear normal.     Left Ear: Tympanic membrane and external ear normal.     Nose: Rhinorrhea present.     Mouth/Throat:     Mouth: Mucous membranes are moist.     Pharynx: Posterior oropharyngeal erythema present.  Eyes:     Extraocular Movements: Extraocular movements intact.     Conjunctiva/sclera: Conjunctivae normal.  Cardiovascular:     Rate and Rhythm: Normal rate and regular rhythm.     Heart sounds: Normal heart sounds.  Pulmonary:     Effort: Pulmonary effort is normal.     Breath sounds: Normal breath sounds. No wheezing or rales.  Musculoskeletal:        General: Normal range of motion.     Cervical back: Normal range of motion and neck supple.  Skin:    General: Skin is warm and dry.  Neurological:     Mental Status: She is alert and oriented to person, place, and time.  Psychiatric:        Mood and Affect: Mood normal.        Thought Content: Thought content normal.      UC Treatments / Results  Labs (all labs ordered are listed, but only abnormal results are displayed) Labs Reviewed - No data to display  EKG   Radiology No results found.  Procedures Procedures (including critical care time)  Medications  Ordered in UC Medications - No data to display  Initial  Impression / Assessment and Plan / UC Course  I have reviewed the triage vital signs and the nursing notes.  Pertinent labs & imaging results that were available during my care of the patient were reviewed by me and considered in my medical decision making (see chart for details).     Hypertensive in triage, improved but still slightly elevated on recheck.  Possibly secondary to viral illness and over-the-counter cold and congestion medications.  Suspect viral respiratory infection.  Treat with Astelin , Coricidin HBP, plain Mucinex, supportive over-the-counter medications and home care.  Return for worsening or unresolving symptoms.  Final Clinical Impressions(s) / UC Diagnoses   Final diagnoses:  Viral URI with cough  Elevated blood pressure reading     Discharge Instructions      I have prescribed a nasal spray to help with your symptoms and in addition recommend saline sinus rinses several times daily as needed, Coricidin HBP, plain Mucinex, Tylenol  as needed.  These medications will not further elevate your blood pressure.  Follow-up for worsening or unresolving symptoms.    ED Prescriptions     Medication Sig Dispense Auth. Provider   azelastine  (ASTELIN ) 0.1 % nasal spray Place 1 spray into both nostrils 2 (two) times daily. Use in each nostril as directed 30 mL Stuart Vernell Norris, PA-C      PDMP not reviewed this encounter.   Stuart Vernell Norris, PA-C 12/25/23 1353

## 2024-01-09 ENCOUNTER — Telehealth: Payer: Self-pay | Admitting: Family Medicine

## 2024-01-09 DIAGNOSIS — F410 Panic disorder [episodic paroxysmal anxiety] without agoraphobia: Secondary | ICD-10-CM

## 2024-01-09 MED ORDER — ESCITALOPRAM OXALATE 20 MG PO TABS: 20.0000 mg | ORAL_TABLET | Freq: Every day | ORAL | 1 refills | Status: AC

## 2024-01-09 NOTE — Progress Notes (Signed)
 jm  Virtual Visit via Video Note  I connected with Michelle Pace on 01/09/2024 at  4:20 PM EST by a video enabled telemedicine application and verified that I am speaking with the correct person using two identifiers.  Patient Location: Home Provider Location: Home Office  I discussed the limitations, risks, security, and privacy concerns of performing an evaluation and management service by video and the availability of in person appointments. I also discussed with the patient that there may be a patient responsible charge related to this service. The patient expressed understanding and agreed to proceed.  Subjective: PCP: Edman Meade PEDLAR, FNP  Chief Complaint  Patient presents with   Medical Management of Chronic Issues    Four month follow up   HPI The patient is seen today for chronic care management and is requesting refills of Lexapro  20 mg daily. No further complaints or concerns were voiced.   ROS: Per HPI Current Medications[1]  Observations/Objective: There were no vitals filed for this visit. Physical Exam Patient is well-developed, well-nourished in no acute distress.  Resting comfortably at home.  Head is normocephalic, atraumatic.  No labored breathing.  Speech is clear and coherent with logical content.  Patient is alert and oriented at baseline.   Assessment and Plan: Panic attacks -     Escitalopram  Oxalate; Take 1 tablet (20 mg total) by mouth daily.  Dispense: 90 tablet; Refill: 1   Refills sent to the pharmacy Encouraged to continue all treatment regimen as is Follow Up Instructions: Return in about 5 months (around 06/08/2024).   I discussed the assessment and treatment plan with the patient. The patient was provided an opportunity to ask questions, and all were answered. The patient agreed with the plan and demonstrated an understanding of the instructions.   The patient was advised to call back or seek an in-person evaluation if the symptoms  worsen or if the condition fails to improve as anticipated.  The above assessment and management plan was discussed with the patient. The patient verbalized understanding of and has agreed to the management plan.   [name]  Z Bacchus, FNP     [1]  Current Outpatient Medications:    acetaminophen  (TYLENOL ) 500 MG tablet, Take 2 tablets (1,000 mg total) by mouth every 6 (six) hours as needed for mild pain, moderate pain, fever or headache., Disp: 30 tablet, Rfl: 0   azelastine  (ASTELIN ) 0.1 % nasal spray, Place 1 spray into both nostrils 2 (two) times daily. Use in each nostril as directed, Disp: 30 mL, Rfl: 0   escitalopram  (LEXAPRO ) 20 MG tablet, Take 1 tablet (20 mg total) by mouth daily., Disp: 90 tablet, Rfl: 1   fluticasone  (FLONASE ) 50 MCG/ACT nasal spray, Place 1 spray into both nostrils 2 (two) times daily. (Patient not taking: Reported on 12/05/2023), Disp: 16 g, Rfl: 2   hydrOXYzine  (VISTARIL ) 25 MG capsule, Take 1 capsule (25 mg total) by mouth every 8 (eight) hours as needed., Disp: 30 capsule, Rfl: 1   ibuprofen  (ADVIL ) 800 MG tablet, Take 1 tablet (800 mg total) by mouth 3 (three) times daily., Disp: 30 tablet, Rfl: 0   UNABLE TO FIND, Med Name: Blood Pressure Cuff R03.0 (Patient not taking: Reported on 12/05/2023), Disp: 1 each, Rfl: 0   valsartan  (DIOVAN ) 80 MG tablet, TAKE 1 TABLET(80 MG) BY MOUTH DAILY, Disp: 90 tablet, Rfl: 1   Vitamin D , Ergocalciferol , (DRISDOL) 1.25 MG (50000 UNIT) CAPS capsule, Take 50,000 Units by mouth every 7 (seven) days. (  Patient not taking: Reported on 12/05/2023), Disp: , Rfl:

## 2024-02-21 ENCOUNTER — Encounter: Payer: Self-pay | Admitting: Adult Health

## 2024-02-21 ENCOUNTER — Ambulatory Visit: Admitting: Adult Health

## 2024-02-21 VITALS — BP 142/96 | HR 95 | Ht 60.0 in | Wt 168.5 lb

## 2024-02-21 DIAGNOSIS — Z01419 Encounter for gynecological examination (general) (routine) without abnormal findings: Secondary | ICD-10-CM | POA: Diagnosis not present

## 2024-02-21 DIAGNOSIS — I10 Essential (primary) hypertension: Secondary | ICD-10-CM

## 2024-02-21 DIAGNOSIS — Z1331 Encounter for screening for depression: Secondary | ICD-10-CM | POA: Diagnosis not present

## 2024-02-21 DIAGNOSIS — Z1231 Encounter for screening mammogram for malignant neoplasm of breast: Secondary | ICD-10-CM | POA: Diagnosis not present

## 2024-02-21 NOTE — Progress Notes (Signed)
 Patient ID: Michelle Pace, female   DOB: [birth year], 44 y.o.   MRN: [id number] History of Present Illness: Michelle Pace is a 44 year old white female with SO, [id number] in for a well woman gyn exam.      Component Value Date/Time   DIAGPAP  12/28/2022 1625    - Negative for intraepithelial lesion or malignancy (NILM)   DIAGPAP  10/31/2019 0940    - Negative for intraepithelial lesion or malignancy (NILM)   DIAGPAP  10/12/2016 0000    NEGATIVE FOR INTRAEPITHELIAL LESIONS OR MALIGNANCY.   HPVHIGH Negative 12/28/2022 1625   HPVHIGH Negative 10/31/2019 0940   ADEQPAP  12/28/2022 1625    Satisfactory for evaluation; transformation zone component PRESENT.   ADEQPAP  10/31/2019 0940    Satisfactory for evaluation; transformation zone component PRESENT.   ADEQPAP  10/12/2016 0000    Satisfactory for evaluation  endocervical/transformation zone component PRESENT.    PCP is Michelle Gerlach NP  Current Medications, Allergies, Past Medical History, Past Surgical History, Family History and Social History were reviewed in [name] record.     Review of Systems: Patient denies any headaches, hearing loss, fatigue, blurred vision, shortness of breath, chest pain, abdominal pain, problems with bowel movements, urination, or intercourse. No joint pain or mood swings.     Physical Exam:BP (!) 142/96 (BP Location: Right Arm, Patient Position: Sitting, Cuff Size: Normal)   Pulse 95   Ht 5' (1.524 m)   Wt 168 lb 8 oz (76.4 kg)   LMP 01/25/2024 (Approximate)   BMI 32.91 kg/m   General:  Well developed, well nourished, no acute distress Skin:  Warm and dry Neck:  Midline trachea, normal thyroid, good ROM, no lymphadenopathy Lungs; Clear to auscultation bilaterally Breast:  No dominant palpable mass, retraction, or nipple discharge Cardiovascular: Regular rate and rhythm Abdomen:  Soft, non tender, no hepatosplenomegaly Pelvic:  External genitalia is normal in appearance, no  lesions.  The vagina is normal in appearance. Urethra has no lesions or masses. The cervix is bulbous.  Uterus is felt to be normal size, shape, and contour.  No adnexal masses or tenderness noted.Bladder is non tender, no masses felt. Rectal: Deferred Extremities/musculoskeletal:  No swelling or varicosities noted, no clubbing or cyanosis Psych:  No mood changes, alert and cooperative,seems happy AA is 0 Fall risk is moderate, feel in snow    02/21/2024    3:03 PM 03/18/2023   10:43 AM 12/28/2022    2:45 PM  Depression screen PHQ 2/9  Decreased Interest 0 0 0  Down, Depressed, Hopeless 0 0 0  PHQ - 2 Score 0 0 0  Altered sleeping 1  0  Tired, decreased energy 0  0  Change in appetite 0  0  Feeling bad or failure about yourself  0  0  Trouble concentrating 0  0  Moving slowly or fidgety/restless 0  0  Suicidal thoughts 0  0  PHQ-9 Score 1  0      Data saved with a previous flowsheet row definition       02/21/2024    3:06 PM 12/28/2022    2:45 PM 12/24/2022    1:42 PM 12/24/2022    1:38 PM  GAD 7 : Generalized Anxiety Score  Nervous, Anxious, on Edge 0 1  0  0   Control/stop worrying 0 0  0  0   Worry too much - different things 1 0  0  0   Trouble relaxing 0  0  0  0   Restless 0 0  0  0   Easily annoyed or irritable 1 1  0  0   Afraid - awful might happen 0 0  0  0   Total GAD 7 Score 2 2 0 0  Anxiety Difficulty   Not difficult at all Not difficult at all     Data saved with a previous flowsheet row definition      Upstream - 02/21/24 1533       Pregnancy Intention Screening   Does the patient want to become pregnant in the next year? No    Does the patient's partner want to become pregnant in the next year? No    Would the patient like to discuss contraceptive options today? No      Contraception Wrap Up   Current Method Female Sterilization    End Method Female Sterilization    Contraception Counseling Provided No    How was the end contraceptive method provided?  N/A         Examination chaperoned by Clarita Salt LPN  Impression and plan: 1. Encounter for well woman exam with routine gynecological exam (Primary) Pap and physical in 1 year Labs with PCP  2. Screening mammogram for breast cancer Mammogram scheduled for her 01/29/24 at 1:45 pm at [facility]  - MM 3D SCREENING MAMMOGRAM BILATERAL BREAST; Future  3. Hypertension, unspecified type Continue Diovan  80 mg 1 daily  and follow up with PCP

## 2024-02-27 ENCOUNTER — Institutional Professional Consult (permissible substitution): Admitting: Plastic Surgery

## 2024-02-29 ENCOUNTER — Ambulatory Visit (HOSPITAL_COMMUNITY)

## 2024-07-18 ENCOUNTER — Ambulatory Visit: Admitting: Physician Assistant
